# Patient Record
Sex: Male | Born: 1945 | Race: White | Hispanic: No | Smoking: Never smoker
Health system: Southern US, Community
[De-identification: ages and names within clinical notes are randomized; demographics above are authoritative.]

## PROBLEM LIST (undated history)

## (undated) DIAGNOSIS — F419 Anxiety disorder, unspecified: Secondary | ICD-10-CM

## (undated) DIAGNOSIS — M1990 Unspecified osteoarthritis, unspecified site: Secondary | ICD-10-CM

## (undated) DIAGNOSIS — E119 Type 2 diabetes mellitus without complications: Secondary | ICD-10-CM

## (undated) DIAGNOSIS — H269 Unspecified cataract: Secondary | ICD-10-CM

## (undated) DIAGNOSIS — Z85828 Personal history of other malignant neoplasm of skin: Secondary | ICD-10-CM

## (undated) DIAGNOSIS — Z8719 Personal history of other diseases of the digestive system: Secondary | ICD-10-CM

## (undated) DIAGNOSIS — R6889 Other general symptoms and signs: Secondary | ICD-10-CM

## (undated) DIAGNOSIS — Z973 Presence of spectacles and contact lenses: Secondary | ICD-10-CM

## (undated) DIAGNOSIS — L570 Actinic keratosis: Secondary | ICD-10-CM

## (undated) DIAGNOSIS — N189 Chronic kidney disease, unspecified: Secondary | ICD-10-CM

## (undated) DIAGNOSIS — C4491 Basal cell carcinoma of skin, unspecified: Secondary | ICD-10-CM

## (undated) DIAGNOSIS — C61 Malignant neoplasm of prostate: Secondary | ICD-10-CM

## (undated) DIAGNOSIS — R972 Elevated prostate specific antigen [PSA]: Secondary | ICD-10-CM

## (undated) DIAGNOSIS — T884XXA Failed or difficult intubation, initial encounter: Secondary | ICD-10-CM

## (undated) DIAGNOSIS — K219 Gastro-esophageal reflux disease without esophagitis: Secondary | ICD-10-CM

## (undated) DIAGNOSIS — Z9889 Other specified postprocedural states: Secondary | ICD-10-CM

## (undated) HISTORY — PX: JOINT REPLACEMENT: SHX530

## (undated) HISTORY — DX: Actinic keratosis: L57.0

## (undated) HISTORY — DX: Type 2 diabetes mellitus without complications: E11.9

## (undated) HISTORY — DX: Malignant neoplasm of prostate: C61

## (undated) HISTORY — PX: TONSILLECTOMY: SUR1361

## (undated) HISTORY — DX: Unspecified osteoarthritis, unspecified site: M19.90

## (undated) HISTORY — DX: Anxiety disorder, unspecified: F41.9

## (undated) HISTORY — PX: MOHS SURGERY: SUR867

## (undated) HISTORY — DX: Basal cell carcinoma of skin, unspecified: C44.91

## (undated) HISTORY — DX: Gastro-esophageal reflux disease without esophagitis: K21.9

---

## 1999-01-19 ENCOUNTER — Encounter: Payer: Self-pay | Admitting: Family Medicine

## 1999-01-19 HISTORY — PX: TOTAL HIP ARTHROPLASTY: SHX124

## 1999-02-02 ENCOUNTER — Encounter: Payer: Self-pay | Admitting: Orthopedic Surgery

## 1999-02-09 ENCOUNTER — Inpatient Hospital Stay (HOSPITAL_COMMUNITY): Admission: RE | Admit: 1999-02-09 | Discharge: 1999-02-13 | Payer: Self-pay | Admitting: Orthopedic Surgery

## 1999-02-09 ENCOUNTER — Encounter: Payer: Self-pay | Admitting: Orthopedic Surgery

## 1999-02-11 ENCOUNTER — Encounter: Payer: Self-pay | Admitting: Orthopedic Surgery

## 2001-03-20 ENCOUNTER — Encounter: Payer: Self-pay | Admitting: Family Medicine

## 2001-06-20 ENCOUNTER — Encounter: Payer: Self-pay | Admitting: Family Medicine

## 2001-12-18 ENCOUNTER — Encounter: Payer: Self-pay | Admitting: Family Medicine

## 2002-03-20 ENCOUNTER — Encounter: Payer: Self-pay | Admitting: Family Medicine

## 2002-03-20 LAB — CONVERTED CEMR LAB
Hgb A1c MFr Bld: 6.9 %
PSA: 0.5 ng/mL

## 2002-09-19 ENCOUNTER — Encounter: Payer: Self-pay | Admitting: Family Medicine

## 2002-09-19 LAB — CONVERTED CEMR LAB: Hgb A1c MFr Bld: 7.5 %

## 2003-01-19 ENCOUNTER — Encounter: Payer: Self-pay | Admitting: Family Medicine

## 2003-01-19 LAB — CONVERTED CEMR LAB: Hgb A1c MFr Bld: 6.4 %

## 2003-06-21 ENCOUNTER — Encounter: Payer: Self-pay | Admitting: Family Medicine

## 2003-06-21 LAB — CONVERTED CEMR LAB: PSA: 0.7 ng/mL

## 2003-09-19 ENCOUNTER — Encounter: Payer: Self-pay | Admitting: Family Medicine

## 2003-09-19 LAB — CONVERTED CEMR LAB: Hgb A1c MFr Bld: 5.9 %

## 2004-04-20 ENCOUNTER — Encounter: Payer: Self-pay | Admitting: Family Medicine

## 2004-04-20 LAB — CONVERTED CEMR LAB: Hgb A1c MFr Bld: 7.5 %

## 2004-05-07 ENCOUNTER — Ambulatory Visit: Payer: Self-pay | Admitting: Family Medicine

## 2004-05-11 ENCOUNTER — Ambulatory Visit: Payer: Self-pay | Admitting: Family Medicine

## 2004-07-20 ENCOUNTER — Ambulatory Visit: Payer: Self-pay | Admitting: Family Medicine

## 2004-07-21 ENCOUNTER — Encounter: Payer: Self-pay | Admitting: Family Medicine

## 2004-07-21 LAB — CONVERTED CEMR LAB: Hgb A1c MFr Bld: 6.4 %

## 2004-08-10 ENCOUNTER — Ambulatory Visit: Payer: Self-pay | Admitting: Family Medicine

## 2004-08-12 ENCOUNTER — Ambulatory Visit: Payer: Self-pay | Admitting: Family Medicine

## 2005-06-20 ENCOUNTER — Encounter: Payer: Self-pay | Admitting: Family Medicine

## 2005-06-20 LAB — CONVERTED CEMR LAB
Hgb A1c MFr Bld: 7.9 %
PSA: 1.11 ng/mL

## 2005-07-13 ENCOUNTER — Ambulatory Visit: Payer: Self-pay | Admitting: Family Medicine

## 2005-07-20 ENCOUNTER — Ambulatory Visit: Payer: Self-pay | Admitting: Family Medicine

## 2005-07-29 HISTORY — PX: ESOPHAGOGASTRODUODENOSCOPY: SHX1529

## 2005-08-10 ENCOUNTER — Ambulatory Visit: Payer: Self-pay | Admitting: Internal Medicine

## 2005-08-31 ENCOUNTER — Ambulatory Visit: Payer: Self-pay | Admitting: Family Medicine

## 2005-10-18 ENCOUNTER — Ambulatory Visit: Payer: Self-pay | Admitting: Family Medicine

## 2005-10-18 LAB — CONVERTED CEMR LAB: Hgb A1c MFr Bld: 6.4 %

## 2005-10-20 ENCOUNTER — Ambulatory Visit: Payer: Self-pay | Admitting: Family Medicine

## 2006-07-20 ENCOUNTER — Ambulatory Visit: Payer: Self-pay | Admitting: Family Medicine

## 2006-07-20 LAB — CONVERTED CEMR LAB
ALT: 32 units/L (ref 0–40)
AST: 26 units/L (ref 0–37)
Albumin: 3.9 g/dL (ref 3.5–5.2)
Alkaline Phosphatase: 64 units/L (ref 39–117)
BUN: 11 mg/dL (ref 6–23)
Bilirubin, Direct: 0.1 mg/dL (ref 0.0–0.3)
CO2: 29 meq/L (ref 19–32)
Calcium: 9.3 mg/dL (ref 8.4–10.5)
Chloride: 103 meq/L (ref 96–112)
Cholesterol: 142 mg/dL (ref 0–200)
Creatinine, Ser: 1 mg/dL (ref 0.4–1.5)
Creatinine,U: 146.1 mg/dL
GFR calc Af Amer: 98 mL/min
GFR calc non Af Amer: 81 mL/min
Glucose, Bld: 135 mg/dL — ABNORMAL HIGH (ref 70–99)
HDL: 47.5 mg/dL (ref >39.0)
Hgb A1c MFr Bld: 7.3 %
Hgb A1c MFr Bld: 7.3 % — ABNORMAL HIGH (ref 4.6–6.0)
LDL Cholesterol: 83 mg/dL (ref 0–99)
Microalb Creat Ratio: 1.4 mg/g (ref 0.0–30.0)
Microalb, Ur: 0.2 mg/dL (ref 0.0–1.9)
Microalbumin U total vol: 1.4 mg/L
PSA: 1.05 ng/mL
PSA: 1.05 ng/mL (ref 0.10–4.00)
Potassium: 4 meq/L (ref 3.5–5.1)
Sodium: 140 meq/L (ref 135–145)
TSH: 2.28 microintl units/mL (ref 0.35–5.50)
Total Bilirubin: 0.7 mg/dL (ref 0.3–1.2)
Total CHOL/HDL Ratio: 3
Total Protein: 6 g/dL (ref 6.0–8.3)
Triglycerides: 57 mg/dL (ref 0–149)
VLDL: 11 mg/dL (ref 0–40)

## 2006-07-24 ENCOUNTER — Ambulatory Visit: Payer: Self-pay | Admitting: Family Medicine

## 2006-09-13 ENCOUNTER — Ambulatory Visit: Payer: Self-pay | Admitting: Family Medicine

## 2006-10-19 ENCOUNTER — Ambulatory Visit: Payer: Self-pay | Admitting: Family Medicine

## 2006-10-19 LAB — CONVERTED CEMR LAB: Hgb A1c MFr Bld: 7.2 % — ABNORMAL HIGH (ref 4.6–6.0)

## 2006-10-31 ENCOUNTER — Encounter: Payer: Self-pay | Admitting: Family Medicine

## 2006-10-31 DIAGNOSIS — K7689 Other specified diseases of liver: Secondary | ICD-10-CM | POA: Insufficient documentation

## 2006-11-03 DIAGNOSIS — K219 Gastro-esophageal reflux disease without esophagitis: Secondary | ICD-10-CM | POA: Insufficient documentation

## 2006-11-03 DIAGNOSIS — F411 Generalized anxiety disorder: Secondary | ICD-10-CM | POA: Insufficient documentation

## 2006-11-03 DIAGNOSIS — R7989 Other specified abnormal findings of blood chemistry: Secondary | ICD-10-CM | POA: Insufficient documentation

## 2006-11-06 ENCOUNTER — Ambulatory Visit: Payer: Self-pay | Admitting: Family Medicine

## 2007-02-14 ENCOUNTER — Encounter: Payer: Self-pay | Admitting: Family Medicine

## 2007-03-19 ENCOUNTER — Encounter: Payer: Self-pay | Admitting: Family Medicine

## 2007-05-04 ENCOUNTER — Ambulatory Visit: Payer: Self-pay | Admitting: Family Medicine

## 2007-05-06 LAB — CONVERTED CEMR LAB: Hgb A1c MFr Bld: 7.7 % — ABNORMAL HIGH (ref 4.6–6.0)

## 2007-05-09 ENCOUNTER — Ambulatory Visit: Payer: Self-pay | Admitting: Family Medicine

## 2007-08-23 ENCOUNTER — Telehealth: Payer: Self-pay | Admitting: Family Medicine

## 2007-09-03 ENCOUNTER — Telehealth: Payer: Self-pay | Admitting: Family Medicine

## 2007-09-17 ENCOUNTER — Ambulatory Visit: Payer: Self-pay | Admitting: Family Medicine

## 2007-09-17 LAB — CONVERTED CEMR LAB
AST: 24 units/L (ref 0–37)
Albumin: 3.5 g/dL (ref 3.5–5.2)
BUN: 13 mg/dL (ref 6–23)
Basophils Absolute: 0 10*3/uL (ref 0.0–0.1)
Basophils Relative: 0.7 % (ref 0.0–1.0)
Chloride: 106 meq/L (ref 96–112)
Cholesterol: 126 mg/dL (ref 0–200)
Creatinine, Ser: 1 mg/dL (ref 0.4–1.5)
Creatinine,U: 95.6 mg/dL
Eosinophils Absolute: 0.1 10*3/uL (ref 0.0–0.7)
Eosinophils Relative: 2.7 % (ref 0.0–5.0)
GFR calc Af Amer: 98 mL/min
GFR calc non Af Amer: 81 mL/min
HCT: 43.1 % (ref 39.0–52.0)
HDL: 49.1 mg/dL (ref >39.0)
Hgb A1c MFr Bld: 8.1 % — ABNORMAL HIGH (ref 4.6–6.0)
MCHC: 33.7 g/dL (ref 30.0–36.0)
MCV: 94.3 fL (ref 78.0–100.0)
Microalb Creat Ratio: 2.1 mg/g (ref 0.0–30.0)
Monocytes Absolute: 0.4 10*3/uL (ref 0.1–1.0)
Neutrophils Relative %: 48.6 % (ref 43.0–77.0)
Platelets: 238 10*3/uL (ref 150–400)
Potassium: 4.6 meq/L (ref 3.5–5.1)
Total Bilirubin: 1 mg/dL (ref 0.3–1.2)
Triglycerides: 34 mg/dL (ref 0–149)
Uric Acid, Serum: 6.5 mg/dL (ref 4.0–7.8)
VLDL: 7 mg/dL (ref 0–40)

## 2007-09-26 ENCOUNTER — Ambulatory Visit: Payer: Self-pay | Admitting: Family Medicine

## 2007-12-26 ENCOUNTER — Ambulatory Visit: Payer: Self-pay | Admitting: Family Medicine

## 2007-12-31 ENCOUNTER — Ambulatory Visit: Payer: Self-pay | Admitting: Family Medicine

## 2008-01-29 ENCOUNTER — Telehealth (INDEPENDENT_AMBULATORY_CARE_PROVIDER_SITE_OTHER): Payer: Self-pay | Admitting: Internal Medicine

## 2008-02-01 ENCOUNTER — Telehealth: Payer: Self-pay | Admitting: Family Medicine

## 2008-03-18 ENCOUNTER — Encounter: Payer: Self-pay | Admitting: Family Medicine

## 2008-04-24 ENCOUNTER — Telehealth: Payer: Self-pay | Admitting: Family Medicine

## 2008-09-08 ENCOUNTER — Telehealth: Payer: Self-pay | Admitting: Family Medicine

## 2008-11-06 ENCOUNTER — Telehealth: Payer: Self-pay | Admitting: Family Medicine

## 2008-11-07 ENCOUNTER — Telehealth: Payer: Self-pay | Admitting: Family Medicine

## 2008-11-13 ENCOUNTER — Encounter: Payer: Self-pay | Admitting: Family Medicine

## 2008-11-13 ENCOUNTER — Telehealth: Payer: Self-pay | Admitting: Family Medicine

## 2009-01-16 ENCOUNTER — Ambulatory Visit: Payer: Self-pay | Admitting: Family Medicine

## 2009-01-16 LAB — CONVERTED CEMR LAB
ALT: 38 units/L (ref 0–53)
Creatinine, Ser: 1.1 mg/dL (ref 0.4–1.5)
HDL: 55.6 mg/dL (ref >39.00)
Hgb A1c MFr Bld: 9.4 % — ABNORMAL HIGH (ref 4.6–6.5)
LDL Cholesterol: 80 mg/dL (ref 0–99)
Potassium: 4.5 meq/L (ref 3.5–5.1)
Sodium: 140 meq/L (ref 135–145)
TSH: 1.4 microintl units/mL (ref 0.35–5.50)
Total CHOL/HDL Ratio: 3
Total Protein: 6.8 g/dL (ref 6.0–8.3)
VLDL: 11.4 mg/dL (ref 0.0–40.0)

## 2009-01-22 ENCOUNTER — Ambulatory Visit: Payer: Self-pay | Admitting: Family Medicine

## 2009-03-19 ENCOUNTER — Encounter: Payer: Self-pay | Admitting: Family Medicine

## 2009-04-22 ENCOUNTER — Ambulatory Visit: Payer: Self-pay | Admitting: Family Medicine

## 2009-04-27 ENCOUNTER — Ambulatory Visit: Payer: Self-pay | Admitting: Family Medicine

## 2009-08-19 ENCOUNTER — Ambulatory Visit: Payer: Self-pay | Admitting: Family Medicine

## 2009-08-19 LAB — CONVERTED CEMR LAB: Hgb A1c MFr Bld: 6 % (ref 4.6–6.5)

## 2009-08-26 ENCOUNTER — Ambulatory Visit: Payer: Self-pay | Admitting: Family Medicine

## 2010-01-20 ENCOUNTER — Telehealth: Payer: Self-pay | Admitting: Family Medicine

## 2010-01-26 ENCOUNTER — Encounter (INDEPENDENT_AMBULATORY_CARE_PROVIDER_SITE_OTHER): Payer: Self-pay | Admitting: *Deleted

## 2010-01-26 ENCOUNTER — Encounter: Payer: Self-pay | Admitting: Family Medicine

## 2010-02-19 ENCOUNTER — Telehealth: Payer: Self-pay | Admitting: Family Medicine

## 2010-03-31 ENCOUNTER — Encounter: Payer: Self-pay | Admitting: Family Medicine

## 2010-05-26 LAB — HM DIABETES EYE EXAM: HM Diabetic Eye Exam: NORMAL

## 2010-06-09 ENCOUNTER — Ambulatory Visit: Payer: Self-pay | Admitting: Family Medicine

## 2010-06-09 LAB — CONVERTED CEMR LAB
ALT: 18 units/L (ref 0–53)
AST: 24 units/L (ref 0–37)
Albumin: 4.1 g/dL (ref 3.5–5.2)
Calcium: 9.2 mg/dL (ref 8.4–10.5)
GFR calc non Af Amer: 75.54 mL/min (ref >60.00)
HDL: 54.8 mg/dL (ref >39.00)
Hgb A1c MFr Bld: 6.7 % — ABNORMAL HIGH (ref 4.6–6.5)
PSA: 2.58 ng/mL (ref 0.10–4.00)
Potassium: 4.2 meq/L (ref 3.5–5.1)
Sodium: 142 meq/L (ref 135–145)
TSH: 1.63 microintl units/mL (ref 0.35–5.50)
Total Protein: 6.5 g/dL (ref 6.0–8.3)
Triglycerides: 46 mg/dL (ref 0.0–149.0)
Uric Acid, Serum: 5.9 mg/dL (ref 4.0–7.8)
VLDL: 9.2 mg/dL (ref 0.0–40.0)

## 2010-06-16 ENCOUNTER — Ambulatory Visit: Payer: Self-pay | Admitting: Family Medicine

## 2010-06-16 LAB — HM DIABETES FOOT EXAM

## 2010-07-20 NOTE — Progress Notes (Signed)
Summary: needs celebrex sent to [facility]  Phone Note Refill Request Message from:  Patient  Refills Requested: Medication #1:  CELEBREX 200 MG CAPS Take one by mouth daily Please send script to [facility]  Initial call taken by: Lowella Petties CMA,  February 19, 2010 11:43 AM  Follow-up for Phone Call        faxed.  Follow-up by: Crawford Givens MD,  February 19, 2010 1:36 PM    Prescriptions: CELEBREX 200 MG CAPS (CELECOXIB) Take one by mouth daily  #90 x 3   Entered and Authorized by:   Crawford Givens MD   Signed by:   Crawford Givens MD on 02/19/2010   Method used:   Faxed to ...       [facility] (mail-order)             , [place]         Ph: [phone]       Fax: [phone]   RxID:   [id number]

## 2010-07-20 NOTE — Medication Information (Signed)
Summary: Prior Authorization & Approval for Celebrex/[employer]  Prior Authorization & Approval for Celebrex/[employer]   Imported By: Lanelle Bal 02/01/2010 11:00:44  _____________________________________________________________________  External Attachment:    Type:   Image     Comment:   External Document

## 2010-07-20 NOTE — Assessment & Plan Note (Signed)
Summary: 4 MONTH FOLLOW UP/[facility]   Vital Signs:  Patient profile:   65 year old male Weight:      160.25 pounds Temp:     98.3 degrees F oral Pulse rate:   76 / minute Pulse rhythm:   regular BP sitting:   120 / 82  (left arm) Cuff size:   regular  Vitals Entered By: Sydell Axon LPN (August 26, 8117 8:10 AM) CC: 4 Month follow-up after labs   History of Present Illness: Guy [name] is a 65 y/o caucasian male who presents today for a 4 mo f/u on A1C and diabetes management.  His A1C has improved from 7.2 4 months ago to 6.0 today.  He has made significant lifestyle changes including joining [employer] with his wife and eating a diet full of salads, fruits and vegetables.  His weight has dropped from 179 lbs 4 months ago to 160 lbs today.  He walks regularly and helps his daughter with yard work as she had a bilateral amputation about 1 year ago.  He is very happy with these results and thinks that he will be able to maintain the lifestyle changes and stated that he finally realized that "you can't take short cuts" to lose weight and maintain weight loss.  From time to time, he checks his morning glucose which normally runs in the mid to low 90s.  Problems Prior to Update: 1)  Plantar Fasciitis, Bilateral  (ICD-728.71) 2)  Hip Pain, Left  (ICD-719.45) 3)  Health Maintenance Exam  (ICD-V70.0) 4)  Special Screening Malignant Neoplasm of Prostate  (ICD-V76.44) 5)  Hyperuricemia  (ICD-790.6) 6)  Fatty Liver Disease  (ICD-571.8) 7)  Hyperglycemia  (ICD-790.6) 8)  Hypercholesterolemia, Ldl 119  (ICD-272.0) 9)  Carcinoma, Basal Cell (RIGHT TEMPLE AREA)  (ICD-173.9) 10)  Gerd  (ICD-530.81) 11)  Depression/ Insomnia  (ICD-311) 12)  Anxiety  (ICD-300.00) 13)  Diabetes Mellitus, Type II  (ICD-250.00)  Medications Prior to Update: 1)  Metformin Hcl 1000 Mg Tabs (Metformin Hcl) .... One Tab By Mouth Two Times A Day 2)  Prilosec 20 Mg Cpdr (Omeprazole) .... Take 1 Capsule By Mouth Twice A  Day 3)  Aspirin 81 Mg Tabs (Aspirin) .... Take One By Mouth Daily 4)  Altace 5 Mg Caps (Ramipril) .... Take One By Mouth Daily 5)  Actos 45 Mg Tabs (Pioglitazone Hcl) .... Take One By Mouth Daily 6)  Celebrex 200 Mg Caps (Celecoxib) .... Take One By Mouth Daily 7)  Pravastatin Sodium 40 Mg Tabs (Pravastatin Sodium) .... Take One By Mouth At Bedtime 8)  Viagra 50 Mg Tabs (Sildenafil Citrate) .... Take One By Mouth As Directed 9)  Ramipril 5 Mg Caps (Ramipril) .... One Tab By Mouth Daily  Allergies: No Known Drug Allergies  Physical Exam  General:  Well-developed,well-nourished,in no acute distress; alert,appropriate and cooperative throughout examination Head:  Normocephalic and atraumatic without obvious abnormalities. No apparent alopecia or balding. Eyes:  Conjunctiva clear bilaterally.  Ears:  External ear exam shows no significant lesions or deformities.  Otoscopic examination reveals clear canals, tympanic membranes are intact bilaterally without bulging, retraction, inflammation or discharge. Hearing is grossly normal bilaterally. Nose:  External nasal examination shows no deformity or inflammation. Nasal mucosa are pink and moist without lesions or exudates. Mouth:  Oral mucosa and oropharynx without lesions or exudates.  Teeth in good repair. Lungs:  Normal respiratory effort, chest expands symmetrically. Lungs are clear to auscultation, no crackles or wheezes. Heart:  Normal rate and  regular rhythm. S1 and S2 normal without gallop, murmur, click, rub or other extra sounds.   Impression & Recommendations:  Problem # 1:  DIABETES MELLITUS, TYPE II (ICD-250.00) Assessment Improved  Great job. Challenge now is to continue. His updated medication list for this problem includes:    Metformin Hcl 1000 Mg Tabs (Metformin hcl) ..... One tab by mouth two times a day    Aspirin 81 Mg Tabs (Aspirin) .Marland Kitchen... Take one by mouth daily    Altace 5 Mg Caps (Ramipril) .Marland Kitchen... Take one by mouth  daily    Actos 45 Mg Tabs (Pioglitazone hcl) .Marland Kitchen... Take one by mouth daily    Ramipril 5 Mg Caps (Ramipril) ..... One tab by mouth daily  Labs Reviewed: Creat: 1.1 (01/16/2009)   Microalbumin: 1.4 (07/20/2006)  Last Eye Exam: normal (03/09/2009) Reviewed HgBA1c results: 6.0 (08/19/2009)  7.2 (04/22/2009)  Complete Medication List: 1)  Metformin Hcl 1000 Mg Tabs (Metformin hcl) .... One tab by mouth two times a day 2)  Prilosec 20 Mg Cpdr (Omeprazole) .... Take 1 capsule by mouth twice a day 3)  Aspirin 81 Mg Tabs (Aspirin) .... Take one by mouth daily 4)  Altace 5 Mg Caps (Ramipril) .... Take one by mouth daily 5)  Actos 45 Mg Tabs (Pioglitazone hcl) .... Take one by mouth daily 6)  Celebrex 200 Mg Caps (Celecoxib) .... Take one by mouth daily 7)  Pravastatin Sodium 40 Mg Tabs (Pravastatin sodium) .... Take one by mouth at bedtime 8)  Viagra 50 Mg Tabs (Sildenafil citrate) .... Take one by mouth as directed 9)  Ramipril 5 Mg Caps (Ramipril) .... One tab by mouth daily  Patient Instructions: 1)  RTC in the Fall for recheck. Prescriptions: ACTOS 45 MG TABS (PIOGLITAZONE HCL) Take one by mouth daily  #90 x 3   Entered by:   Sydell Axon LPN   Authorized by:   Guy Leeks MD   Signed by:   Sydell Axon LPN on 16/03/9603   Method used:   Print then Give to Patient   RxID:   [id number]   Current Allergies (reviewed today): No known allergies

## 2010-07-20 NOTE — Consult Note (Signed)
Summary: Dr.Charles Sydnor,[facility],Note  Dr.Charles Sydnor,[facility],Note   Imported By: Beau Fanny 04/06/2010 08:14:37  _____________________________________________________________________  External Attachment:    Type:   Image     Comment:   External Document  Appended Document: Dr.Charles Sydnor,[facility],Note     Clinical Lists Changes  Observations: Added new observation of DMEYEEXAMNXT: 03/2011 (04/06/2010 20:27) Added new observation of DMEYEEXMRES: normal (03/31/2010 20:28) Added new observation of EYE EXAM BY: Dr Oren Bracket, [facility] (03/31/2010 20:28) Added new observation of DIAB EYE EX: normal (03/31/2010 20:28)        Diabetes Management Exam:    Eye Exam:       Eye Exam done elsewhere          Date: 03/31/2010          Results: normal          Done by: Dr Oren Bracket, [facility]

## 2010-07-20 NOTE — Progress Notes (Signed)
Summary: prior auth needed for celebrex  Phone Note Other Incoming   Caller: [employer] Summary of Call: Prior Berkley Harvey is needed for celebrex, form is on your desk.  We didnt call for this form, not sure why we have it,unless pt has current auth that is going to expire. Initial call taken by: Lowella Petties CMA,  January 20, 2010 4:34 PM  Follow-up for Phone Call        call patient.  get info for last set of questions.  He has h/o GERD, so if he has tried 2 other NSAIDS that would appear to be enough.  Follow-up by: Crawford Givens MD,  January 20, 2010 8:35 PM  Additional Follow-up for Phone Call Additional follow up Details #1::        Pt has been on daypro, vioxx and niaspan in the past.           Lowella Petties CMA  January 21, 2010 10:44 AM  Added to form and faxed.  Additional Follow-up by: Delilah Shan CMA Duncan Dull),  January 22, 2010 10:11 AM    Additional Follow-up for Phone Call Additional follow up Details #2::    And did he ever take any ibuprofen or aleve for this?  let me know one way or the other and I'll finish the form.  thanks. Follow-up by: Crawford Givens MD,  January 21, 2010 2:07 PM  Additional Follow-up for Phone Call Additional follow up Details #3:: Details for Additional Follow-up Action Taken: Pt has tried these meds.          Lowella Petties CMA  January 21, 2010 4:38 PM   --please fill in the form to show that patient failed on ibuprofen, aleve, vioxx, and has active GERD.  that should cover it.  please send it in. Crawford Givens MD,  January 21, 2010 11:21 PM   Appended Document: prior auth needed for celebrex Prior auth approval received for celebrex.  Forms sent to doctor for signature and scanning.

## 2010-07-20 NOTE — Letter (Signed)
Summary: Nadara Eaton letter  [facility]  [address] [place], [place] [address]   Phone: [phone]  Fax: [phone]       01/26/2010 MRN: [id number]  [facility] [address] [place], [place]  [address]  Dear Mr. [facility] [place] and [place] announce the retirement of Arta Silence, M.D., from full-time practice at the [facility] office effective December 17, 2009 and his plans of returning part-time.  It is important to Dr. Hetty Ely and to our practice that you understand that [facility] has seven physicians in our office for your health care needs.  We will continue to offer the same exceptional care that you have today.    Dr. Hetty Ely has spoken to many of you about his plans for retirement and returning part-time in the fall.   We will continue to work with you through the transition to schedule appointments for you in the office and meet the high standards that [place] is committed to.   Again, it is with great pleasure that we share the news that Dr. Hetty Ely will return to [facility] at [facility] in October of 2011 with a reduced schedule.    If you have any questions, or would like to request an appointment with one of our physicians, please call us at [phone] and press the option for Scheduling an appointment.  We take pleasure in providing you with excellent patient care and look forward to seeing you at your next office visit.  Our [facility] Physicians are:  Tillman Abide, M.D. Laurita Quint, M.D. Roxy Manns, M.D. Kerby Nora, M.D. Hannah Beat, M.D. Ruthe Mannan, M.D. We proudly welcomed Raechel Ache, M.D. and Eustaquio Boyden, M.D. to the practice in July/August 2011.  Sincerely,  [facility]

## 2010-07-22 NOTE — Assessment & Plan Note (Signed)
Summary: CPX/JRR   Vital Signs:  Patient profile:   65 year old male Height:      65.50 inches Weight:      174.75 pounds BMI:     28.74 Temp:     97.8 degrees F oral Pulse rate:   80 / minute Pulse rhythm:   regular BP sitting:   110 / 70  (left arm) Cuff size:   regular  Vitals Entered By: Delilah Shan CMA Duncan Dull) (June 16, 2010 8:58 AM) CC: CPX   History of Present Illness: Pt here for Comp Exam. He is feeling well and has no complaints. He has gained nine pounds since he quit walking. His brother needs a heart valve. N known reason for the valve problem. He has had a pacemaker. Wife recently had a knee replacement.   Preventive Screening-Counseling & Management  Alcohol-Tobacco     Alcohol drinks/day: <1     Alcohol type: beer     Smoking Status: never     Passive Smoke Exposure: no  Caffeine-Diet-Exercise     Caffeine use/day: 1     Does Patient Exercise: yes     Type of exercise: walking     Exercise (avg: min/session): >60     Times/week: 7  Problems Prior to Update: 1)  Plantar Fasciitis, Bilateral  (ICD-728.71) 2)  Hip Pain, Left  (ICD-719.45) 3)  Health Maintenance Exam  (ICD-V70.0) 4)  Special Screening Malignant Neoplasm of Prostate  (ICD-V76.44) 5)  Hyperuricemia  (ICD-790.6) 6)  Fatty Liver Disease  (ICD-571.8) 7)  Hypercholesterolemia, Ldl 119  (ICD-272.0) 8)  Carcinoma, Basal Cell (RIGHT TEMPLE AREA)  (ICD-173.9) 9)  Gerd  (ICD-530.81) 10)  Depression/ Insomnia  (ICD-311) 11)  Anxiety  (ICD-300.00) 12)  Diabetes Mellitus, Type II (HYPERGLY 8/86)  (ICD-250.00)  Medications Prior to Update: 1)  Metformin Hcl 1000 Mg Tabs (Metformin Hcl) .... One Tab By Mouth Two Times A Day 2)  Prilosec 20 Mg Cpdr (Omeprazole) .... Take 1 Capsule By Mouth Twice A Day 3)  Aspirin 81 Mg Tabs (Aspirin) .... Take One By Mouth Daily 4)  Altace 5 Mg Caps (Ramipril) .... Take One By Mouth Daily 5)  Actos 45 Mg Tabs (Pioglitazone Hcl) .... Take One By Mouth  Daily 6)  Celebrex 200 Mg Caps (Celecoxib) .... Take One By Mouth Daily 7)  Pravastatin Sodium 40 Mg Tabs (Pravastatin Sodium) .... Take One By Mouth At Bedtime 8)  Viagra 50 Mg Tabs (Sildenafil Citrate) .... Take One By Mouth As Directed 9)  Ramipril 5 Mg Caps (Ramipril) .... One Tab By Mouth Daily  Current Medications (verified): 1)  Metformin Hcl 1000 Mg Tabs (Metformin Hcl) .... One Tab By Mouth Two Times A Day 2)  Prilosec 20 Mg Cpdr (Omeprazole) .... Take 1 Capsule By Mouth Twice A Day 3)  Aspirin 81 Mg Tabs (Aspirin) .... Take One By Mouth Daily 4)  Actos 45 Mg Tabs (Pioglitazone Hcl) .... Take One By Mouth Daily 5)  Celebrex 200 Mg Caps (Celecoxib) .... Take One By Mouth Daily 6)  Pravastatin Sodium 40 Mg Tabs (Pravastatin Sodium) .... Take One By Mouth At Bedtime 7)  Viagra 50 Mg Tabs (Sildenafil Citrate) .... Take One By Mouth As Directed 8)  Ramipril 5 Mg Caps (Ramipril) .... One Tab By Mouth Daily 9)  Fish Oil   Oil (Fish Oil) .Marland Kitchen.. 1200 Mg. Every Morning 10)  Vitamin C 500 Mg  Tabs (Ascorbic Acid) .... 1,000 Mg. Once Daily 11)  Vitamin B Complex-C  Caps (B Complex-C) .... Once Daily 12)  Multivitamins   Tabs (Multiple Vitamin) .... Take 1 Tablet By Mouth Once A Day 13)  Tylenol Pm Extra Strength 500-25 Mg Tabs (Diphenhydramine-Apap (Sleep)) .... As Needed  Allergies: No Known Drug Allergies  Past History:  Past Medical History: Last updated: 10/31/2006 Anxiety Depression GERD Diabetes mellitus, type II  Past Surgical History: Last updated: 10/31/2006 Tonsillectomy as child Hip Replacement R (DR Geoffre) 01/1999 Abd. U/S, wnl 07/04/05 EGD, wnl H. H. 07/29/05 Colonoscopy wnl except hemorrhoids 07/29/05 Abd. U/S gallbladder polyp 07/08/05  Family History: Last updated: 06-22-2010 Father: Died at ge 24, Alzheimer's, many strokes, polyps, HTN,and diabetes Mother: A 54 Smoker COPD Shingles Brother A 64  Obesity  HTN and poss. 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 Daughter with septic shock x 2, now with bilat foot amputations BTK.  Risk Factors: Alcohol Use: <1 (Jun 22, 2010) Caffeine Use: 1 (06-22-2010) Exercise: yes (June 22, 2010)  Risk Factors: Smoking Status: never (June 22, 2010) Passive Smoke Exposure: no (06-22-2010)  Family History: Father: Died at ge 18, Alzheimer's, many strokes, polyps, HTN,and diabetes Mother: A 70 Smoker COPD Shingles Brother A 64  Obesity  HTN and poss. 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 Daughter with septic shock x 2, now with bilat foot amputations BTK.  Review of Systems General:  Denies chills, fatigue, fever, sweats, weakness, and weight loss. Eyes:  Denies blurring, discharge, and eye pain. ENT:  Denies decreased hearing, earache, and ringing in ears. CV:  Denies chest pain or discomfort, fainting, fatigue, palpitations, shortness of breath with exertion, swelling of feet, and swelling of hands. Resp:  Denies cough, shortness of breath, and wheezing. GI:  Complains of indigestion; denies abdominal pain, bloody stools, change in bowel habits, constipation, dark tarry stools, diarrhea, loss of appetite, nausea, vomiting, vomiting blood, and yellowish skin color; occas. GU:  Denies discharge, dysuria, nocturia, and urinary frequency. MS:  Denies joint pain, low back pain, muscle aches, muscle weakness, and stiffness. Derm:  Denies dryness, itching, and rash. Neuro:  Denies numbness, poor balance, tingling, and tremors.  Physical Exam  General:  Well-developed,well-nourished,in no acute distress; alert,appropriate and cooperative throughout examination Head:  Normocephalic and atraumatic without obvious abnormalities. No  apparent alopecia or balding. Eyes:  Conjunctiva clear bilaterally.  Ears:  External ear exam shows no significant lesions or deformities.  Otoscopic examination reveals clear canals, tympanic membranes are intact bilaterally without bulging, retraction, inflammation or discharge. Hearing is grossly normal bilaterally. Nose:  External nasal examination shows no deformity or inflammation. Nasal mucosa are pink and moist without lesions or exudates. Mouth:  Oral mucosa and oropharynx without lesions or exudates.  Teeth in good repair. Neck:  No deformities, masses, or tenderness noted. Chest Wall:  No deformities, masses, tenderness or gynecomastia noted. Breasts:  No masses or gynecomastia noted Lungs:  Normal respiratory effort, chest expands symmetrically. Lungs are clear to auscultation, no crackles or wheezes. Heart:  Normal rate and regular rhythm. S1 and S2 normal without gallop, murmur, click, rub or other extra sounds. Abdomen:  Bowel sounds positive,abdomen soft and non-tender without masses, organomegaly or hernias noted. Rectal:  No external abnormalities noted. Normal sphincter tone. No rectal masses or tenderness. G neg. Genitalia:  Testes bilaterally descended without nodularity, tenderness or masses. No  scrotal masses or lesions. No penis lesions or urethral discharge. Prostate:  Prostate gland firm and smooth, no enlargement, nodularity, tenderness, mass, asymmetry or induration. 30gms Msk:  No deformity or scoliosis noted of thoracic or lumbar spine.   Pulses:  R and L carotid,radial,femoral,dorsalis pedis and posterior tibial pulses are full and equal bilaterally Extremities:  No clubbing, cyanosis, edema, or deformity noted with normal full range of motion of all joints.  L shoulder  back to normal. Slight triggering of left thumb. Neurologic:  No cranial nerve deficits noted. Station and gait are normal.  Sensory, motor and coordinative functions appear intact. Skin:  Intact  without suspicious lesions or rashes Cervical Nodes:  No lymphadenopathy noted Inguinal Nodes:  No significant adenopathy Psych:  Cognition and judgment appear intact. Alert and cooperative with normal attention span and concentration. No apparent delusions, illusions, hallucinations  Diabetes Management Exam:    Foot Exam (with socks and/or shoes not present):       Sensory-Pinprick/Light touch:          Left medial foot (L-4): normal          Left dorsal foot (L-5): normal          Left lateral foot (S-1): normal          Right medial foot (L-4): normal          Right dorsal foot (L-5): normal          Right lateral foot (S-1): normal       Sensory-Monofilament:          Left foot: normal          Right foot: normal       Inspection:          Left foot: normal          Right foot: normal       Nails:          Left foot: normal          Right foot: normal    Eye Exam:       Eye Exam done elsewhere          Date: 05/26/2010          Results: normal          Done by: Dr Bettey Costa, [facility]   Impression & Recommendations:  Problem # 1:  HEALTH MAINTENANCE EXAM (ICD-V70.0)  Problem # 2:  SPECIAL SCREENING MALIGNANT NEOPLASM OF PROSTATE (ICD-V76.44) Assessment: Unchanged Stable PSA and exam.  Problem # 3:  HYPERURICEMIA (ICD-790.6) Assessment: Improved nml today.  Problem # 4:  FATTY LIVER DISEASE (ICD-571.8) Assessment: Improved Essentially nml LFTS this year.  Problem # 5:  HYPERCHOLESTEROLEMIA, LDL 119 (ICD-272.0) Assessment: Unchanged Good control. Cont medication. His updated medication list for this problem includes:    Pravastatin Sodium 40 Mg Tabs (Pravastatin sodium) .Marland Kitchen... Take one by mouth at bedtime  Labs Reviewed: SGOT: 24 (06/09/2010)   SGPT: 18 (06/09/2010)   HDL:54.80 (06/09/2010), 55.60 (01/16/2009)  LDL:64 (06/09/2010), 80 (01/16/2009)  Chol:128 (06/09/2010), 147 (01/16/2009)  Trig:46.0 (06/09/2010), 57.0 (01/16/2009)  Problem # 6:  GERD  (ICD-530.81) Assessment: Unchanged  Adequate with weight loss....get down to his ideal weight.  His updated medication list for this problem includes:    Prilosec 20 Mg Cpdr (Omeprazole) .Marland Kitchen... Take 1 capsule by mouth twice a day  Problem # 7:  DIABETES MELLITUS, TYPE II (HYPERGLY 8/86) (ICD-250.00) Assessment: Deteriorated Slightly worse ...discussed at length, exercise and resultant weight  loss important. The following medications were removed from the medication list:    Altace 5 Mg Caps (Ramipril) .Marland Kitchen... Take one by mouth daily His updated medication list for this problem includes:    Metformin Hcl 1000 Mg Tabs (Metformin hcl) ..... One tab by mouth two times a day    Aspirin 81 Mg Tabs (Aspirin) .Marland Kitchen... Take one by mouth daily    Actos 45 Mg Tabs (Pioglitazone hcl) .Marland Kitchen... Take one by mouth daily    Ramipril 5 Mg Caps (Ramipril) ..... One tab by mouth daily  Labs Reviewed: Creat: 1.1 (06/09/2010)   Microalbumin: 1.4 (07/20/2006)  Last Eye Exam: normal (05/26/2010) Reviewed HgBA1c results: 6.7 (06/09/2010)  6.0 (08/19/2009)  Complete Medication List: 1)  Metformin Hcl 1000 Mg Tabs (Metformin hcl) .... One tab by mouth two times a day 2)  Prilosec 20 Mg Cpdr (Omeprazole) .... Take 1 capsule by mouth twice a day 3)  Aspirin 81 Mg Tabs (Aspirin) .... Take one by mouth daily 4)  Actos 45 Mg Tabs (Pioglitazone hcl) .... Take one by mouth daily 5)  Celebrex 200 Mg Caps (Celecoxib) .... Take one by mouth daily 6)  Pravastatin Sodium 40 Mg Tabs (Pravastatin sodium) .... Take one by mouth at bedtime 7)  Viagra 50 Mg Tabs (Sildenafil citrate) .... Take one by mouth as directed 8)  Ramipril 5 Mg Caps (Ramipril) .... One tab by mouth daily 9)  Fish Oil Oil (Fish oil) .Marland Kitchen.. 1200 mg. every morning 10)  Vitamin C 500 Mg Tabs (Ascorbic acid) .... 1,000 mg. once daily 11)  Vitamin B Complex-c Caps (B complex-c) .... Once daily 12)  Multivitamins Tabs (Multiple vitamin) .... Take 1 tablet by mouth  once a day 13)  Tylenol Pm Extra Strength 500-25 Mg Tabs (Diphenhydramine-apap (sleep)) .... As needed  Patient Instructions: 1)  RTC 6 mos., A1C prior 250.00 2)  Call if triggering of thumb gets worse. Prescriptions: VIAGRA 50 MG TABS (SILDENAFIL CITRATE) Take one by mouth as directed  #9 x 4   Entered and Authorized by:   Shaune Leeks MD   Signed by:   Shaune Leeks MD on 06/16/2010   Method used:   Print then Give to Patient   RxID:   [id number] RAMIPRIL 5 MG CAPS (RAMIPRIL) one tab by mouth daily  #90 Capsule x 3   Entered and Authorized by:   Shaune Leeks MD   Signed by:   Shaune Leeks MD on 06/16/2010   Method used:   Print then Give to Patient   RxID:   [id number] CELEBREX 200 MG CAPS (CELECOXIB) Take one by mouth daily  #90 x 3   Entered and Authorized by:   Shaune Leeks MD   Signed by:   Shaune Leeks MD on 06/16/2010   Method used:   Print then Give to Patient   RxID:   [id number] ACTOS 45 MG TABS (PIOGLITAZONE HCL) Take one by mouth daily  #90 x 3   Entered and Authorized by:   Shaune Leeks MD   Signed by:   Shaune Leeks MD on 06/16/2010   Method used:   Print then Give to Patient   RxID:   [id number] PRAVASTATIN SODIUM 40 MG TABS (PRAVASTATIN SODIUM) Take one by mouth at bedtime  #30 x 12   Entered and Authorized by:   Shaune Leeks MD   Signed by:   Shaune Leeks MD on 06/16/2010   Method used:  Print then Give to Patient   RxID:   [id number] METFORMIN HCL 1000 MG TABS (METFORMIN HCL) one tab by mouth two times a day  #60 x 12   Entered and Authorized by:   Shaune Leeks MD   Signed by:   Shaune Leeks MD on 06/16/2010   Method used:   Print then Give to Patient   RxID:   [id number]    Orders Added: 1)  Est. Patient 40-64 years [44034]    Current Allergies (reviewed today): No known allergies

## 2010-10-04 ENCOUNTER — Other Ambulatory Visit: Payer: Self-pay | Admitting: *Deleted

## 2010-10-04 MED ORDER — PRAVASTATIN SODIUM 40 MG PO TABS: 40.0000 mg | ORAL_TABLET | Freq: Every evening | ORAL | Status: DC

## 2010-10-12 ENCOUNTER — Other Ambulatory Visit: Payer: Self-pay | Admitting: *Deleted

## 2010-10-12 MED ORDER — RAMIPRIL 5 MG PO CAPS: 5.0000 mg | ORAL_CAPSULE | Freq: Every day | ORAL | Status: DC

## 2010-10-12 MED ORDER — PIOGLITAZONE HCL 45 MG PO TABS: 45.0000 mg | ORAL_TABLET | Freq: Every day | ORAL | Status: DC

## 2010-10-12 NOTE — Telephone Encounter (Signed)
Patient came by the office and requested that a new rx's be sent in to [facility]. Patient states that he was using [facility] and now [facility]. Patient states that he has decided to use [facility] for all of his prescriptions instead of using [facility]. Patient needs new rx's sent to [facility] for Ramipril 5 mg, one daily and Actos 45 mg, one daily.

## 2010-11-18 ENCOUNTER — Encounter: Payer: Self-pay | Admitting: Family Medicine

## 2010-12-20 ENCOUNTER — Other Ambulatory Visit (INDEPENDENT_AMBULATORY_CARE_PROVIDER_SITE_OTHER): Payer: Federal, State, Local not specified - PPO

## 2010-12-20 DIAGNOSIS — E119 Type 2 diabetes mellitus without complications: Secondary | ICD-10-CM

## 2010-12-20 LAB — HEMOGLOBIN A1C: Hgb A1c MFr Bld: 6.8 % — ABNORMAL HIGH (ref 4.6–6.5)

## 2010-12-23 ENCOUNTER — Ambulatory Visit (INDEPENDENT_AMBULATORY_CARE_PROVIDER_SITE_OTHER): Payer: Federal, State, Local not specified - PPO | Admitting: Family Medicine

## 2010-12-23 ENCOUNTER — Encounter: Payer: Self-pay | Admitting: Family Medicine

## 2010-12-23 ENCOUNTER — Ambulatory Visit: Payer: Self-pay | Admitting: Family Medicine

## 2010-12-23 DIAGNOSIS — E119 Type 2 diabetes mellitus without complications: Secondary | ICD-10-CM | POA: Insufficient documentation

## 2010-12-23 DIAGNOSIS — F411 Generalized anxiety disorder: Secondary | ICD-10-CM

## 2010-12-23 DIAGNOSIS — E1122 Type 2 diabetes mellitus with diabetic chronic kidney disease: Secondary | ICD-10-CM | POA: Insufficient documentation

## 2010-12-23 NOTE — Progress Notes (Signed)
  Subjective:    Patient ID: Guy [name], male    DOB: [birth year], 65 y.o.   MRN: [id number]  HPIPt here for followup. DM last seen had slipped slightly. He is feeling well with no compolaints. He now understandds he is really diabetic. His brother had a valve replacement and had medication changes and problems that were finally straightened out. His daughter got sepsis again anfd then his mother gotr sick. He has now started walking every other day which is now down to 3 miles a day. He cuts three yards a week. His wife has had surgery lately and he had to do all the housework. He is trying to watch his diet..he had gained weight with all the hospital exposure.  He has no complaints today. He walks regularly and has learned to hydrate.  He has been called about his colonoscopy and will schedule next week.    Review of Systems  Constitutional: Negative for fever, chills, diaphoresis, activity change, appetite change and fatigue.  HENT: Negative for hearing loss, ear pain, congestion, sore throat, rhinorrhea, neck pain, neck stiffness, postnasal drip, sinus pressure, tinnitus and ear discharge.   Eyes: Negative for pain, discharge and visual disturbance.  Respiratory: Negative for cough, shortness of breath and wheezing.   Cardiovascular: Negative for chest pain and palpitations.       No SOB w/ exertion  Gastrointestinal:       No heartburn or swallowing problems.  Genitourinary:       No nocturia  Skin:       No itching or dryness.  Neurological:       No tingling or balance problems.  All other systems reviewed and are negative.       Objective:   Physical Exam  Constitutional: He appears well-developed and well-nourished. No distress.  HENT:  Head: Normocephalic and atraumatic.  Right Ear: External ear normal.  Nose: Nose normal.  Mouth/Throat: Oropharynx is clear and moist.       L ear occluded with cerumen.  Eyes: Conjunctivae and EOM are normal. Pupils are equal,  round, and reactive to light. Right eye exhibits no discharge. Left eye exhibits no discharge.  Neck: Normal range of motion. Neck supple.  Cardiovascular: Normal rate and regular rhythm.   Pulmonary/Chest: Effort normal and breath sounds normal. He has no wheezes.  Lymphadenopathy:    He has no cervical adenopathy.  Skin: He is not diaphoretic.          Assessment & Plan:

## 2010-12-23 NOTE — Assessment & Plan Note (Addendum)
A1C minimally improved but now motivated to watch diet and cont exercise. Lab Results  Component Value Date   HGBA1C 6.8* 12/20/2010

## 2010-12-23 NOTE — Assessment & Plan Note (Signed)
Doing well. Handled many stressors with good results.

## 2010-12-23 NOTE — Patient Instructions (Signed)
RTC 6 mos for Comp Exam, labs prior. Cont to be careful with diet and continue exercise.

## 2010-12-30 ENCOUNTER — Other Ambulatory Visit: Payer: Self-pay | Admitting: *Deleted

## 2010-12-30 MED ORDER — PRAVASTATIN SODIUM 40 MG PO TABS: 40.0000 mg | ORAL_TABLET | Freq: Every evening | ORAL | Status: DC

## 2011-01-27 ENCOUNTER — Other Ambulatory Visit: Payer: Self-pay | Admitting: *Deleted

## 2011-01-27 MED ORDER — METFORMIN HCL 1000 MG PO TABS: 1000.0000 mg | ORAL_TABLET | Freq: Two times a day (BID) | ORAL | Status: DC

## 2011-01-27 MED ORDER — RAMIPRIL 5 MG PO CAPS: 5.0000 mg | ORAL_CAPSULE | Freq: Every day | ORAL | Status: DC

## 2011-03-29 ENCOUNTER — Telehealth: Payer: Self-pay | Admitting: *Deleted

## 2011-03-29 NOTE — Telephone Encounter (Signed)
Prior auth is needed for celebrex, form is on your desk. 

## 2011-03-29 NOTE — Telephone Encounter (Signed)
Done. In my outbox.

## 2011-05-31 ENCOUNTER — Other Ambulatory Visit: Payer: Self-pay | Admitting: *Deleted

## 2011-05-31 NOTE — Telephone Encounter (Signed)
Pt needs 90 day rx, he has f/u in Jan 2013.

## 2011-06-01 HISTORY — PX: COLONOSCOPY: SHX174

## 2011-06-01 MED ORDER — CELECOXIB 200 MG PO CAPS: 200.0000 mg | ORAL_CAPSULE | Freq: Every day | ORAL | Status: DC

## 2011-06-07 ENCOUNTER — Other Ambulatory Visit: Payer: Self-pay | Admitting: Family Medicine

## 2011-06-07 DIAGNOSIS — E119 Type 2 diabetes mellitus without complications: Secondary | ICD-10-CM

## 2011-06-15 ENCOUNTER — Other Ambulatory Visit (INDEPENDENT_AMBULATORY_CARE_PROVIDER_SITE_OTHER): Payer: Federal, State, Local not specified - PPO

## 2011-06-15 DIAGNOSIS — E119 Type 2 diabetes mellitus without complications: Secondary | ICD-10-CM

## 2011-06-15 LAB — COMPREHENSIVE METABOLIC PANEL
AST: 20 U/L (ref 0–37)
Albumin: 4.1 g/dL (ref 3.5–5.2)
BUN: 21 mg/dL (ref 6–23)
Calcium: 9 mg/dL (ref 8.4–10.5)
Chloride: 105 mEq/L (ref 96–112)
Glucose, Bld: 133 mg/dL — ABNORMAL HIGH (ref 70–99)
Potassium: 4.3 mEq/L (ref 3.5–5.1)

## 2011-06-15 LAB — LIPID PANEL
Cholesterol: 146 mg/dL (ref 0–200)
LDL Cholesterol: 82 mg/dL (ref 0–99)

## 2011-06-22 ENCOUNTER — Encounter: Payer: Federal, State, Local not specified - PPO | Admitting: Family Medicine

## 2011-06-24 ENCOUNTER — Encounter: Payer: Self-pay | Admitting: Family Medicine

## 2011-06-24 ENCOUNTER — Ambulatory Visit (INDEPENDENT_AMBULATORY_CARE_PROVIDER_SITE_OTHER): Payer: Federal, State, Local not specified - PPO | Admitting: Family Medicine

## 2011-06-24 VITALS — BP 132/80 | HR 79 | Temp 98.4°F | Wt 186.0 lb

## 2011-06-24 DIAGNOSIS — Z125 Encounter for screening for malignant neoplasm of prostate: Secondary | ICD-10-CM

## 2011-06-24 DIAGNOSIS — E785 Hyperlipidemia, unspecified: Secondary | ICD-10-CM | POA: Diagnosis not present

## 2011-06-24 DIAGNOSIS — E119 Type 2 diabetes mellitus without complications: Secondary | ICD-10-CM

## 2011-06-24 DIAGNOSIS — Z23 Encounter for immunization: Secondary | ICD-10-CM

## 2011-06-24 MED ORDER — SILDENAFIL CITRATE 50 MG PO TABS: ORAL_TABLET | ORAL | Status: DC

## 2011-06-24 MED ORDER — METFORMIN HCL 1000 MG PO TABS: 1000.0000 mg | ORAL_TABLET | Freq: Two times a day (BID) | ORAL | Status: DC

## 2011-06-24 MED ORDER — PRAVASTATIN SODIUM 40 MG PO TABS: 40.0000 mg | ORAL_TABLET | Freq: Every evening | ORAL | Status: DC

## 2011-06-24 MED ORDER — RAMIPRIL 5 MG PO CAPS: 5.0000 mg | ORAL_CAPSULE | Freq: Every day | ORAL | Status: DC

## 2011-06-24 MED ORDER — CELECOXIB 200 MG PO CAPS: 200.0000 mg | ORAL_CAPSULE | Freq: Every day | ORAL | Status: DC

## 2011-06-24 MED ORDER — PIOGLITAZONE HCL 45 MG PO TABS: 45.0000 mg | ORAL_TABLET | Freq: Every day | ORAL | Status: DC

## 2011-06-24 NOTE — Patient Instructions (Addendum)
I would get a flu shot each fall.  I'd get one soon (at the pharmacy).   Try to get back into your walking routine.  Recheck A1c in 6 months with OV a few days later.  Take care.  Glad to see you.

## 2011-06-24 NOTE — Progress Notes (Signed)
Diabetes:  Using medications without difficulties:yes Hypoglycemic episodes:no Hyperglycemic episodes:no Feet problems:no Blood Sugars averaging: ~120s-140s eye exam within last year: due for recheck.  He'll call about this.  He had been walking as much with family illnesses.   We talked about actos.  At this point, there is no clear evidence to direct a change in meds (re: actos).  Med is tolerated (no h/o bladder CA and no known h/o blood in urine) and benefit from control of DM2 outweighs other considerations.  Pt agrees to continue actos.  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  On celebrex.  Occ leg pain.  He'll f/u with ortho.    Hypertension:    Using medication without problems or lightheadedness: yes Chest pain with exertion:no Edema:no Short of breath:no  Elevated Cholesterol: Using medications without problems: yes Muscle aches: mild, occ Diet compliance:yes Exercise: limited recently  PMH and SH reviewed.   Vital signs, Meds and allergies reviewed.  ROS: See HPI.  Otherwise nontributory.   GEN: nad, alert and oriented HEENT: mucous membranes moist NECK: supple w/o LA CV: rrr.  no murmur PULM: ctab, no inc wob ABD: soft, +bs EXT: no edema SKIN: no acute rash Prostate gland firm and smooth, no enlargement, nodularity, tenderness, mass, asymmetry or induration.  Diabetic foot exam: Normal inspection No skin breakdown No calluses  Normal DP pulses Normal sensation to light tough and monofilament Nails normal

## 2011-06-26 ENCOUNTER — Encounter: Payer: Self-pay | Admitting: Family Medicine

## 2011-06-26 DIAGNOSIS — R972 Elevated prostate specific antigen [PSA]: Secondary | ICD-10-CM | POA: Insufficient documentation

## 2011-06-26 DIAGNOSIS — E785 Hyperlipidemia, unspecified: Secondary | ICD-10-CM | POA: Insufficient documentation

## 2011-06-26 NOTE — Assessment & Plan Note (Signed)
Controlled , no change in meds 

## 2011-06-26 NOTE — Assessment & Plan Note (Signed)
Sig social upheaval affected his exercise routine.  Work on diet and exercise, no change in meds and f/u for repeat A1c.  Labs d/w pt.  He agrees. >25 min spent with face to face with patient, >50% counseling and/or coordinating care

## 2011-06-26 NOTE — Assessment & Plan Note (Signed)
Neg FH and DRE unremarkable.  PSA options were discussed along with recent recs.  No indication for psa at this point, since patient is low risk and there is no FH of prosate CA.  He declined testing of PSA.

## 2011-07-07 DIAGNOSIS — E119 Type 2 diabetes mellitus without complications: Secondary | ICD-10-CM | POA: Diagnosis not present

## 2011-12-13 ENCOUNTER — Other Ambulatory Visit: Payer: Medicare Other

## 2011-12-16 ENCOUNTER — Other Ambulatory Visit (INDEPENDENT_AMBULATORY_CARE_PROVIDER_SITE_OTHER): Payer: Federal, State, Local not specified - PPO

## 2011-12-16 DIAGNOSIS — E119 Type 2 diabetes mellitus without complications: Secondary | ICD-10-CM

## 2011-12-23 ENCOUNTER — Encounter: Payer: Self-pay | Admitting: Family Medicine

## 2011-12-23 ENCOUNTER — Ambulatory Visit (INDEPENDENT_AMBULATORY_CARE_PROVIDER_SITE_OTHER): Payer: Federal, State, Local not specified - PPO | Admitting: Family Medicine

## 2011-12-23 VITALS — BP 110/60 | HR 79 | Temp 97.8°F | Ht 65.5 in | Wt 160.0 lb

## 2011-12-23 DIAGNOSIS — E119 Type 2 diabetes mellitus without complications: Secondary | ICD-10-CM

## 2011-12-23 MED ORDER — PIOGLITAZONE HCL 45 MG PO TABS: 22.5000 mg | ORAL_TABLET | Freq: Every day | ORAL | Status: DC

## 2011-12-23 NOTE — Progress Notes (Signed)
He is still having some hip discomfort and he'll f/u with ortho about this.   His mother died 17-Aug-2011.  Daughter was in the hospital twice, septic both times.  She's had to have IVIG.  She was recently engaged.    Diabetes: Using medications without difficulties:yes Hypoglycemic episodes:no Hyperglycemic episodes:no Feet problems:no Blood Sugars averaging: ~80-90 fasting eye exam within last year: yes, normal exam per patient . He's losing weight intentionally (along with this wife) and A1c is down to 6.1.  We had discussed actos prev and will continue at half dose.   He still isn't walking as much as prev, but he'll work on this.    Meds, vitals, and allergies reviewed.   ROS: See HPI.  Otherwise negative.    GEN: nad, alert and oriented NECK: supple w/o LA CV: rrr. PULM: ctab, no inc wob ABD: soft, +bs EXT: no edema SKIN: no acute rash  Diabetic foot exam: Normal inspection No skin breakdown No calluses  Normal DP pulses Normal sensation to light touch and monofilament Nails normal

## 2011-12-23 NOTE — Patient Instructions (Addendum)
Cut back to 22.5 mg of actos and recheck labs before a physical in early 2014.   Take care.  Glad to see you.

## 2011-12-23 NOTE — Assessment & Plan Note (Signed)
Much improved, continue meds but cut back to 22.5mg  of actos.  Recheck in early 2014.  I thanked him for his efforts.

## 2012-01-13 ENCOUNTER — Other Ambulatory Visit: Payer: Self-pay | Admitting: *Deleted

## 2012-01-13 MED ORDER — PRAVASTATIN SODIUM 40 MG PO TABS: 40.0000 mg | ORAL_TABLET | Freq: Every evening | ORAL | Status: DC

## 2012-03-08 DIAGNOSIS — IMO0002 Reserved for concepts with insufficient information to code with codable children: Secondary | ICD-10-CM | POA: Diagnosis not present

## 2012-06-09 ENCOUNTER — Other Ambulatory Visit: Payer: Self-pay | Admitting: Family Medicine

## 2012-06-09 DIAGNOSIS — E119 Type 2 diabetes mellitus without complications: Secondary | ICD-10-CM

## 2012-06-18 ENCOUNTER — Other Ambulatory Visit (INDEPENDENT_AMBULATORY_CARE_PROVIDER_SITE_OTHER): Payer: Medicare Other

## 2012-06-18 DIAGNOSIS — E119 Type 2 diabetes mellitus without complications: Secondary | ICD-10-CM | POA: Diagnosis not present

## 2012-06-18 LAB — COMPREHENSIVE METABOLIC PANEL
ALT: 16 U/L (ref 0–53)
Albumin: 4 g/dL (ref 3.5–5.2)
CO2: 30 mEq/L (ref 19–32)
Calcium: 9.3 mg/dL (ref 8.4–10.5)
Chloride: 102 mEq/L (ref 96–112)
Creatinine, Ser: 1.2 mg/dL (ref 0.4–1.5)
GFR: 65.61 mL/min (ref >60.00)
Potassium: 4.8 mEq/L (ref 3.5–5.1)
Sodium: 139 mEq/L (ref 135–145)
Total Protein: 6.7 g/dL (ref 6.0–8.3)

## 2012-06-18 LAB — LIPID PANEL
Total CHOL/HDL Ratio: 3
Triglycerides: 48 mg/dL (ref 0.0–149.0)

## 2012-06-20 HISTORY — PX: PROSTATE BIOPSY: SHX241

## 2012-06-25 ENCOUNTER — Encounter: Payer: Self-pay | Admitting: Family Medicine

## 2012-06-25 ENCOUNTER — Ambulatory Visit (INDEPENDENT_AMBULATORY_CARE_PROVIDER_SITE_OTHER): Payer: Medicare Other | Admitting: Family Medicine

## 2012-06-25 VITALS — BP 102/62 | HR 83 | Temp 98.2°F | Ht 65.5 in | Wt 160.0 lb

## 2012-06-25 DIAGNOSIS — Z0000 Encounter for general adult medical examination without abnormal findings: Secondary | ICD-10-CM | POA: Diagnosis not present

## 2012-06-25 DIAGNOSIS — E119 Type 2 diabetes mellitus without complications: Secondary | ICD-10-CM | POA: Diagnosis not present

## 2012-06-25 DIAGNOSIS — E785 Hyperlipidemia, unspecified: Secondary | ICD-10-CM | POA: Diagnosis not present

## 2012-06-25 DIAGNOSIS — Z23 Encounter for immunization: Secondary | ICD-10-CM | POA: Diagnosis not present

## 2012-06-25 DIAGNOSIS — Z125 Encounter for screening for malignant neoplasm of prostate: Secondary | ICD-10-CM | POA: Diagnosis not present

## 2012-06-25 DIAGNOSIS — K219 Gastro-esophageal reflux disease without esophagitis: Secondary | ICD-10-CM

## 2012-06-25 MED ORDER — CELECOXIB 200 MG PO CAPS: 200.0000 mg | ORAL_CAPSULE | Freq: Every day | ORAL | Status: DC

## 2012-06-25 MED ORDER — PRAVASTATIN SODIUM 40 MG PO TABS: 40.0000 mg | ORAL_TABLET | Freq: Every evening | ORAL | Status: DC

## 2012-06-25 MED ORDER — PIOGLITAZONE HCL 45 MG PO TABS: 22.5000 mg | ORAL_TABLET | Freq: Every day | ORAL | Status: DC

## 2012-06-25 MED ORDER — METFORMIN HCL 1000 MG PO TABS: 1000.0000 mg | ORAL_TABLET | Freq: Two times a day (BID) | ORAL | Status: DC

## 2012-06-25 MED ORDER — RAMIPRIL 5 MG PO CAPS: 5.0000 mg | ORAL_CAPSULE | Freq: Every day | ORAL | Status: DC

## 2012-06-25 NOTE — Progress Notes (Signed)
I have personally reviewed the Medicare Annual Wellness questionnaire and have noted 1. The patient's medical and social history 2. Their use of alcohol, tobacco or illicit drugs 3. Their current medications and supplements 4. The patient's functional ability including ADL's, fall risks, home safety risks and hearing or visual             impairment. 5. Diet and physical activities 6. Evidence for depression or mood disorders  The patients weight, height, BMI have been recorded in the chart and visual acuity is per eye clinic.  I have made referrals, counseling and provided education to the patient based review of the above and I have provided the pt with a written personalized care plan for preventive services.  See scanned forms.  Routine anticipatory guidance given to patient.  See health maintenance. Flu today Shingles 2008 PNA 2013 Tetanus 2013 Colonoscopy 2013 Prostate cancer screening.  D/w pt about PSA.   We agreed to check it today.  DRE prev wnl.  Advance directive- he'll check his papers.  Wife would be designated if incapacitated.   Cognitive function addressed- see scanned forms- and if abnormal then additional documentation follows.   Diabetes:  Using medications without difficulties:yes Hypoglycemic episodes:no Hyperglycemic episodes:no Feet problems:no Blood Sugars averaging: ~100, occ lower A1c 6.5  Hypertension:    Using medication without problems or lightheadedness: yes Chest pain with exertion:no Edema:no Short of breath:no  Elevated Cholesterol: Using medications without problems:yes Muscle aches: mild Diet compliance:discussed Exercise: discussed  PMH and SH reviewed.   Vital signs, Meds and allergies reviewed.  ROS: See HPI.  Otherwise nontributory.   GEN: nad, alert and oriented HEENT: mucous membranes moist NECK: supple w/o LA CV: rrr.  PULM: ctab, no inc wob ABD: soft, +bs EXT: no edema SKIN: no acute rash  Diabetic foot  exam: Normal inspection No skin breakdown No calluses  Normal DP pulses Normal sensation to light tough and monofilament Nails normal

## 2012-06-25 NOTE — Patient Instructions (Signed)
Go to the lab on the way out.  We'll contact you with your lab report.  Take care.  Keep working on your diet and start back exercising.   Recheck in 6 months with A1c ahead of time.  Glad to see you.

## 2012-06-26 ENCOUNTER — Encounter: Payer: Self-pay | Admitting: Family Medicine

## 2012-06-26 DIAGNOSIS — Z0000 Encounter for general adult medical examination without abnormal findings: Secondary | ICD-10-CM | POA: Insufficient documentation

## 2012-06-26 NOTE — Assessment & Plan Note (Signed)
Controlled, he'll continue current meds and will work more on diet.  Labs d/w pt.  He agrees.

## 2012-06-26 NOTE — Assessment & Plan Note (Signed)
Controlled, he'll continue current meds and will work more on diet.  Labs d/w pt.  He agrees.  

## 2012-06-26 NOTE — Assessment & Plan Note (Signed)
With elevated PSA, will recheck in 1 month.  If still elevated, refer to uro. No sx and no FH.

## 2012-06-26 NOTE — Assessment & Plan Note (Signed)
Controlled with occ PPI use per patient report.  Doing well.

## 2012-06-26 NOTE — Assessment & Plan Note (Signed)
See scanned forms.  Routine anticipatory guidance given to patient.  See health maintenance. Flu today Shingles 2008 PNA 2013 Tetanus 2013 Colonoscopy 2013 Prostate cancer screening.  D/w pt about PSA.   We agreed to check it today.  DRE prev wnl.  Advance directive- he'll check his papers.  Wife would be designated if incapacitated.   Cognitive function addressed- see scanned forms- and if abnormal then additional documentation follows.

## 2012-07-17 DIAGNOSIS — E119 Type 2 diabetes mellitus without complications: Secondary | ICD-10-CM | POA: Diagnosis not present

## 2012-07-27 ENCOUNTER — Other Ambulatory Visit (INDEPENDENT_AMBULATORY_CARE_PROVIDER_SITE_OTHER): Payer: Medicare Other

## 2012-07-27 DIAGNOSIS — E119 Type 2 diabetes mellitus without complications: Secondary | ICD-10-CM | POA: Diagnosis not present

## 2012-07-27 DIAGNOSIS — Z125 Encounter for screening for malignant neoplasm of prostate: Secondary | ICD-10-CM | POA: Diagnosis not present

## 2012-07-30 ENCOUNTER — Telehealth: Payer: Self-pay | Admitting: Family Medicine

## 2012-07-30 DIAGNOSIS — Z125 Encounter for screening for malignant neoplasm of prostate: Secondary | ICD-10-CM

## 2012-07-30 NOTE — Telephone Encounter (Signed)
Orders are in   Thanks

## 2012-07-30 NOTE — Telephone Encounter (Addendum)
Pt dropped by the office and scheduled PSA lab appt, March 10,2014 a t 10:15am, per your previous conversation.  If you need to speak with him again, he can be reached at [phone].  Thank you

## 2012-08-27 ENCOUNTER — Other Ambulatory Visit (INDEPENDENT_AMBULATORY_CARE_PROVIDER_SITE_OTHER): Payer: Medicare Other

## 2012-08-27 DIAGNOSIS — Z125 Encounter for screening for malignant neoplasm of prostate: Secondary | ICD-10-CM

## 2012-08-27 LAB — PSA, MEDICARE: PSA: 4.66 ng/ml — ABNORMAL HIGH (ref 0.10–4.00)

## 2012-08-28 ENCOUNTER — Other Ambulatory Visit: Payer: Self-pay | Admitting: Family Medicine

## 2012-08-28 DIAGNOSIS — R972 Elevated prostate specific antigen [PSA]: Secondary | ICD-10-CM

## 2012-09-17 DIAGNOSIS — R972 Elevated prostate specific antigen [PSA]: Secondary | ICD-10-CM | POA: Diagnosis not present

## 2012-09-20 ENCOUNTER — Encounter: Payer: Self-pay | Admitting: Family Medicine

## 2012-09-25 ENCOUNTER — Telehealth: Payer: Self-pay | Admitting: Family Medicine

## 2012-09-25 ENCOUNTER — Encounter: Payer: Self-pay | Admitting: Family Medicine

## 2012-09-25 NOTE — Telephone Encounter (Signed)
I called pt.  I had heard that his wife had died recently.  I called to offer my support.  He thanked me.  Also he has f/u with uro pending re: prostate biopsy.

## 2012-10-01 ENCOUNTER — Telehealth: Payer: Self-pay

## 2012-10-01 NOTE — Telephone Encounter (Signed)
Dee pts daughter left v/m requesting prior auth to be done for Celebrex to [employer]. Form in Dr Lianne Bushy in box.

## 2012-10-01 NOTE — Telephone Encounter (Signed)
I'll work on the hard copy.  

## 2012-10-08 NOTE — Telephone Encounter (Signed)
Celebrex was approved; Annabelle Harman at [employer] said already received approval and med mailed out on 10/05/12. Pt notified by v/m.Letter on Dr Lianne Bushy desk for signature and scanning.

## 2012-11-15 DIAGNOSIS — IMO0002 Reserved for concepts with insufficient information to code with codable children: Secondary | ICD-10-CM | POA: Diagnosis not present

## 2012-11-15 DIAGNOSIS — R972 Elevated prostate specific antigen [PSA]: Secondary | ICD-10-CM | POA: Diagnosis not present

## 2012-12-17 ENCOUNTER — Other Ambulatory Visit (INDEPENDENT_AMBULATORY_CARE_PROVIDER_SITE_OTHER): Payer: Medicare Other

## 2012-12-17 DIAGNOSIS — E119 Type 2 diabetes mellitus without complications: Secondary | ICD-10-CM | POA: Diagnosis not present

## 2012-12-17 LAB — HEMOGLOBIN A1C: Hgb A1c MFr Bld: 6.7 % — ABNORMAL HIGH (ref 4.6–6.5)

## 2012-12-24 ENCOUNTER — Ambulatory Visit (INDEPENDENT_AMBULATORY_CARE_PROVIDER_SITE_OTHER): Payer: Medicare Other | Admitting: Family Medicine

## 2012-12-24 ENCOUNTER — Encounter: Payer: Self-pay | Admitting: Family Medicine

## 2012-12-24 VITALS — BP 104/66 | HR 82 | Temp 98.1°F | Wt 166.5 lb

## 2012-12-24 DIAGNOSIS — Z634 Disappearance and death of family member: Secondary | ICD-10-CM | POA: Insufficient documentation

## 2012-12-24 DIAGNOSIS — E119 Type 2 diabetes mellitus without complications: Secondary | ICD-10-CM

## 2012-12-24 DIAGNOSIS — R972 Elevated prostate specific antigen [PSA]: Secondary | ICD-10-CM | POA: Diagnosis not present

## 2012-12-24 NOTE — Patient Instructions (Addendum)
Recheck A1c in 11/14 before a visit.  Take care.  Glad to see you.

## 2012-12-24 NOTE — Assessment & Plan Note (Signed)
D/w pt about his current situation.  Support offered.  He has family support.  He'll call back as needed.  He is appropriate today.

## 2012-12-24 NOTE — Progress Notes (Signed)
Widowed in 2014.  Discussed.  He has started back walking, about 4.5 miles a day.  He is managing with the current situation.  "It's been different.  We did everything together.  It is getting some better.  I'm trying to make sure I'm busy."  His daughter is checking on him. His brothers are supportive.    Prostate biopsy done for elevated PSA.  Bx was inconclusive and he'll have f/u with uro about this.    Diabetes:  Using medications without difficulties: yes Hypoglycemic episodes:no Hyperglycemic episodes:no Feet problems:no Blood Sugars averaging: ~100 eye exam within last year: done in 10/14.  Exercise helped with his sugar.   "I don't know how to cook and I'm trying to work on that.  She did all the cooking."  PMH and SH reviewed  Meds, vitals, and allergies reviewed.   ROS: See HPI.  Otherwise negative.    GEN: nad, alert and oriented HEENT: mucous membranes moist NECK: supple w/o LA CV: rrr. PULM: ctab, no inc wob ABD: soft, +bs EXT: no edema SKIN: no acute rash  Diabetic foot exam: Normal inspection No skin breakdown No calluses  Normal DP pulses Normal sensation to light touch and monofilament Nails normal

## 2012-12-24 NOTE — Assessment & Plan Note (Signed)
Controlled, he'll try to work on diet and continue with exercise.  Recheck later in 2014.  Social upheaval noted.

## 2012-12-24 NOTE — Assessment & Plan Note (Signed)
Prostate biopsy done for elevated PSA.  Bx was inconclusive and he'll have f/u with uro about this.

## 2013-04-22 ENCOUNTER — Other Ambulatory Visit: Payer: Medicare Other

## 2013-04-25 ENCOUNTER — Other Ambulatory Visit: Payer: Self-pay

## 2013-04-29 ENCOUNTER — Ambulatory Visit: Payer: Medicare Other | Admitting: Family Medicine

## 2013-05-24 DIAGNOSIS — R972 Elevated prostate specific antigen [PSA]: Secondary | ICD-10-CM | POA: Diagnosis not present

## 2013-05-30 ENCOUNTER — Other Ambulatory Visit (HOSPITAL_COMMUNITY): Payer: Self-pay | Admitting: Urology

## 2013-05-30 DIAGNOSIS — Z0000 Encounter for general adult medical examination without abnormal findings: Secondary | ICD-10-CM | POA: Diagnosis not present

## 2013-05-30 DIAGNOSIS — R972 Elevated prostate specific antigen [PSA]: Secondary | ICD-10-CM | POA: Diagnosis not present

## 2013-06-05 ENCOUNTER — Ambulatory Visit (HOSPITAL_COMMUNITY)
Admission: RE | Admit: 2013-06-05 | Discharge: 2013-06-05 | Disposition: A | Discharge status: Home / Self Care | Payer: Medicare Other | Source: Ambulatory Visit | Attending: Urology | Admitting: Urology

## 2013-06-05 DIAGNOSIS — N4289 Other specified disorders of prostate: Secondary | ICD-10-CM | POA: Diagnosis not present

## 2013-06-05 DIAGNOSIS — R972 Elevated prostate specific antigen [PSA]: Secondary | ICD-10-CM | POA: Insufficient documentation

## 2013-06-05 DIAGNOSIS — N429 Disorder of prostate, unspecified: Secondary | ICD-10-CM | POA: Diagnosis not present

## 2013-06-05 MED ORDER — GADOBENATE DIMEGLUMINE 529 MG/ML IV SOLN
15.0000 mL | Freq: Once | INTRAVENOUS | Status: AC | PRN
  Administered 2013-06-05: 15 mL via INTRAVENOUS

## 2013-06-24 ENCOUNTER — Other Ambulatory Visit (INDEPENDENT_AMBULATORY_CARE_PROVIDER_SITE_OTHER): Payer: Medicare Other

## 2013-06-24 DIAGNOSIS — E119 Type 2 diabetes mellitus without complications: Secondary | ICD-10-CM | POA: Diagnosis not present

## 2013-06-24 LAB — HEMOGLOBIN A1C: Hgb A1c MFr Bld: 6.5 % (ref 4.6–6.5)

## 2013-07-01 ENCOUNTER — Ambulatory Visit: Payer: Medicare Other | Admitting: Family Medicine

## 2013-07-15 DIAGNOSIS — R972 Elevated prostate specific antigen [PSA]: Secondary | ICD-10-CM | POA: Diagnosis not present

## 2013-07-15 DIAGNOSIS — IMO0002 Reserved for concepts with insufficient information to code with codable children: Secondary | ICD-10-CM | POA: Diagnosis not present

## 2013-07-17 ENCOUNTER — Other Ambulatory Visit: Payer: Self-pay | Admitting: *Deleted

## 2013-07-17 ENCOUNTER — Encounter: Payer: Self-pay | Admitting: Family Medicine

## 2013-07-17 MED ORDER — CELECOXIB 200 MG PO CAPS: 200.0000 mg | ORAL_CAPSULE | Freq: Every day | ORAL | Status: DC

## 2013-07-17 NOTE — Telephone Encounter (Signed)
Sent!

## 2013-07-17 NOTE — Telephone Encounter (Signed)
Received faxed refill request from pharmacy. Last refill 06/25/12. Patient has an appointment scheduled 07/22/13. Is it okay to refill medication?

## 2013-07-18 ENCOUNTER — Other Ambulatory Visit: Payer: Self-pay | Admitting: *Deleted

## 2013-07-21 ENCOUNTER — Ambulatory Visit: Payer: Self-pay | Admitting: Urology

## 2013-07-21 DIAGNOSIS — Z9889 Other specified postprocedural states: Secondary | ICD-10-CM | POA: Diagnosis not present

## 2013-07-21 DIAGNOSIS — M1990 Unspecified osteoarthritis, unspecified site: Secondary | ICD-10-CM | POA: Diagnosis not present

## 2013-07-21 DIAGNOSIS — R972 Elevated prostate specific antigen [PSA]: Secondary | ICD-10-CM | POA: Diagnosis not present

## 2013-07-21 DIAGNOSIS — Z79899 Other long term (current) drug therapy: Secondary | ICD-10-CM | POA: Diagnosis not present

## 2013-07-21 DIAGNOSIS — E7800 Pure hypercholesterolemia, unspecified: Secondary | ICD-10-CM | POA: Diagnosis not present

## 2013-07-21 DIAGNOSIS — Z85828 Personal history of other malignant neoplasm of skin: Secondary | ICD-10-CM | POA: Diagnosis not present

## 2013-07-21 DIAGNOSIS — R338 Other retention of urine: Secondary | ICD-10-CM | POA: Diagnosis not present

## 2013-07-21 DIAGNOSIS — Z96649 Presence of unspecified artificial hip joint: Secondary | ICD-10-CM | POA: Diagnosis not present

## 2013-07-21 DIAGNOSIS — R310 Gross hematuria: Secondary | ICD-10-CM | POA: Diagnosis not present

## 2013-07-21 DIAGNOSIS — R319 Hematuria, unspecified: Secondary | ICD-10-CM | POA: Diagnosis not present

## 2013-07-21 DIAGNOSIS — N135 Crossing vessel and stricture of ureter without hydronephrosis: Secondary | ICD-10-CM | POA: Diagnosis not present

## 2013-07-21 DIAGNOSIS — IMO0002 Reserved for concepts with insufficient information to code with codable children: Secondary | ICD-10-CM | POA: Diagnosis not present

## 2013-07-21 DIAGNOSIS — K219 Gastro-esophageal reflux disease without esophagitis: Secondary | ICD-10-CM | POA: Diagnosis not present

## 2013-07-21 DIAGNOSIS — E119 Type 2 diabetes mellitus without complications: Secondary | ICD-10-CM | POA: Diagnosis not present

## 2013-07-21 DIAGNOSIS — N9989 Other postprocedural complications and disorders of genitourinary system: Secondary | ICD-10-CM | POA: Diagnosis not present

## 2013-07-21 DIAGNOSIS — N3289 Other specified disorders of bladder: Secondary | ICD-10-CM | POA: Diagnosis not present

## 2013-07-21 LAB — URINALYSIS, COMPLETE
Bacteria: NONE SEEN
Bilirubin,UR: NEGATIVE
Glucose,UR: 50 mg/dL (ref 0–75)
Ketone: NEGATIVE
LEUKOCYTE ESTERASE: NEGATIVE
Nitrite: NEGATIVE
PH: 6 (ref 4.5–8.0)
Protein: 100
Specific Gravity: 1.005 (ref 1.003–1.030)
Squamous Epithelial: 69
WBC UR: 1787 /HPF (ref 0–5)

## 2013-07-21 LAB — COMPREHENSIVE METABOLIC PANEL
ALBUMIN: 4 g/dL (ref 3.4–5.0)
ALK PHOS: 80 U/L
Anion Gap: 4 — ABNORMAL LOW (ref 7–16)
BILIRUBIN TOTAL: 0.4 mg/dL (ref 0.2–1.0)
BUN: 21 mg/dL — AB (ref 7–18)
CALCIUM: 9.4 mg/dL (ref 8.5–10.1)
CHLORIDE: 104 mmol/L (ref 98–107)
CO2: 28 mmol/L (ref 21–32)
Creatinine: 1.29 mg/dL (ref 0.60–1.30)
EGFR (African American): >60
EGFR (Non-African Amer.): 57 — ABNORMAL LOW
Glucose: 226 mg/dL — ABNORMAL HIGH (ref 65–99)
Osmolality: 282 (ref 275–301)
Potassium: 4 mmol/L (ref 3.5–5.1)
SGOT(AST): 27 U/L (ref 15–37)
SGPT (ALT): 23 U/L (ref 12–78)
SODIUM: 136 mmol/L (ref 136–145)
TOTAL PROTEIN: 7.3 g/dL (ref 6.4–8.2)

## 2013-07-21 LAB — CBC
HCT: 41.3 % (ref 40.0–52.0)
HGB: 14 g/dL (ref 13.0–18.0)
MCH: 32.5 pg (ref 26.0–34.0)
MCHC: 33.9 g/dL (ref 32.0–36.0)
MCV: 96 fL (ref 80–100)
PLATELETS: 289 10*3/uL (ref 150–440)
RBC: 4.31 10*6/uL — AB (ref 4.40–5.90)
RDW: 13.3 % (ref 11.5–14.5)
WBC: 10.1 10*3/uL (ref 3.8–10.6)

## 2013-07-22 ENCOUNTER — Observation Stay: Payer: Self-pay | Admitting: Urology

## 2013-07-22 ENCOUNTER — Ambulatory Visit: Payer: Medicare Other | Admitting: Family Medicine

## 2013-07-22 DIAGNOSIS — N135 Crossing vessel and stricture of ureter without hydronephrosis: Secondary | ICD-10-CM | POA: Diagnosis not present

## 2013-07-22 DIAGNOSIS — R319 Hematuria, unspecified: Secondary | ICD-10-CM | POA: Diagnosis not present

## 2013-07-22 DIAGNOSIS — R339 Retention of urine, unspecified: Secondary | ICD-10-CM | POA: Diagnosis not present

## 2013-07-22 LAB — CBC WITH DIFFERENTIAL/PLATELET
BASOS PCT: 0.5 %
Basophil #: 0 10*3/uL (ref 0.0–0.1)
Eosinophil #: 0.2 10*3/uL (ref 0.0–0.7)
Eosinophil %: 1.9 %
HCT: 38.8 % — ABNORMAL LOW (ref 40.0–52.0)
HGB: 13.3 g/dL (ref 13.0–18.0)
LYMPHS PCT: 28.9 %
Lymphocyte #: 2.5 10*3/uL (ref 1.0–3.6)
MCH: 32.8 pg (ref 26.0–34.0)
MCHC: 34.2 g/dL (ref 32.0–36.0)
MCV: 96 fL (ref 80–100)
MONO ABS: 0.8 x10 3/mm (ref 0.2–1.0)
Monocyte %: 9.3 %
NEUTROS ABS: 5 10*3/uL (ref 1.4–6.5)
NEUTROS PCT: 59.4 %
Platelet: 253 10*3/uL (ref 150–440)
RBC: 4.06 10*6/uL — AB (ref 4.40–5.90)
RDW: 13.3 % (ref 11.5–14.5)
WBC: 8.5 10*3/uL (ref 3.8–10.6)

## 2013-07-22 LAB — APTT: ACTIVATED PTT: 27.7 s (ref 23.6–35.9)

## 2013-07-22 LAB — PROTIME-INR
INR: 1
Prothrombin Time: 12.8 secs (ref 11.5–14.7)

## 2013-07-23 DIAGNOSIS — R319 Hematuria, unspecified: Secondary | ICD-10-CM | POA: Diagnosis not present

## 2013-07-23 DIAGNOSIS — R339 Retention of urine, unspecified: Secondary | ICD-10-CM | POA: Diagnosis not present

## 2013-07-25 DIAGNOSIS — R972 Elevated prostate specific antigen [PSA]: Secondary | ICD-10-CM | POA: Diagnosis not present

## 2013-07-28 ENCOUNTER — Encounter: Payer: Self-pay | Admitting: Family Medicine

## 2013-07-29 ENCOUNTER — Encounter: Payer: Self-pay | Admitting: Family Medicine

## 2013-07-29 ENCOUNTER — Ambulatory Visit (INDEPENDENT_AMBULATORY_CARE_PROVIDER_SITE_OTHER): Payer: Medicare Other | Admitting: Family Medicine

## 2013-07-29 VITALS — BP 116/78 | HR 86 | Temp 98.1°F | Wt 172.8 lb

## 2013-07-29 DIAGNOSIS — R972 Elevated prostate specific antigen [PSA]: Secondary | ICD-10-CM | POA: Diagnosis not present

## 2013-07-29 DIAGNOSIS — E119 Type 2 diabetes mellitus without complications: Secondary | ICD-10-CM | POA: Diagnosis not present

## 2013-07-29 MED ORDER — AMOXICILLIN 500 MG PO CAPS
2000.0000 mg | ORAL_CAPSULE | Freq: Every day | ORAL | Status: DC
Start: 2013-07-29 — End: 2015-03-02

## 2013-07-29 NOTE — Patient Instructions (Signed)
Schedule a physical for about 4 months from now. Labs ahead of time.  Stop the actos for now.  Work on exercise in the meantime.  Take care.  Glad to see you.

## 2013-07-29 NOTE — Progress Notes (Signed)
Pre-visit discussion using our clinic review tool. No additional management support is needed unless otherwise documented below in the visit note.  Had bleeding after prostate biopsy, in his urine. Was seen at [facility], had urinary retention, resolved with cath.   Requesting records.  No more bleeding in the meantime. Had uro f/u in the meantime and u/a was normal per patient, along with u/s bladder.    Diabetes:  Using medications without difficulties: yes Hypoglycemic episodes: no Hyperglycemic episodes: no Feet problems: no Blood Sugars averaging: ~100-110 eye exam within last year: upcoming, he'll call for appointment.   He's going to try to work on diet more. The holidays were tough for patient.    We talked about getting off actos.  He'll stop it.   Meds, vitals, and allergies reviewed.   ROS: See HPI.  Otherwise negative.    GEN: nad, alert and oriented HEENT: mucous membranes moist NECK: supple w/o LA CV: rrr. PULM: ctab, no inc wob ABD: soft, +bs EXT: no edema SKIN: no acute rash  Diabetic foot exam: Normal inspection No skin breakdown No calluses  Normal DP pulses Normal sensation to light touch and monofilament Nails normal

## 2013-07-30 NOTE — Assessment & Plan Note (Signed)
Per uro, see [place] noted.

## 2013-07-30 NOTE — Assessment & Plan Note (Signed)
Good control given the circumstances, would stop actos and continue metformin, continue work on diet and weight. He agrees.  See instructions.  Labs d/w pt.

## 2013-08-01 ENCOUNTER — Telehealth: Payer: Self-pay

## 2013-08-01 NOTE — Telephone Encounter (Signed)
Relevant patient education assigned to patient using Emmi. ° °

## 2013-08-09 ENCOUNTER — Other Ambulatory Visit: Payer: Self-pay | Admitting: Family Medicine

## 2013-08-14 DIAGNOSIS — H43819 Vitreous degeneration, unspecified eye: Secondary | ICD-10-CM | POA: Diagnosis not present

## 2013-08-19 ENCOUNTER — Other Ambulatory Visit: Payer: Self-pay | Admitting: Family Medicine

## 2013-10-25 ENCOUNTER — Other Ambulatory Visit: Payer: Medicare Other

## 2013-10-28 ENCOUNTER — Encounter: Payer: Medicare Other | Admitting: Family Medicine

## 2013-11-14 ENCOUNTER — Other Ambulatory Visit: Payer: Self-pay | Admitting: Family Medicine

## 2013-11-26 ENCOUNTER — Other Ambulatory Visit: Payer: Self-pay | Admitting: Family Medicine

## 2013-12-27 DIAGNOSIS — H0019 Chalazion unspecified eye, unspecified eyelid: Secondary | ICD-10-CM | POA: Diagnosis not present

## 2014-01-01 ENCOUNTER — Other Ambulatory Visit (INDEPENDENT_AMBULATORY_CARE_PROVIDER_SITE_OTHER): Payer: Medicare Other

## 2014-01-01 DIAGNOSIS — E119 Type 2 diabetes mellitus without complications: Secondary | ICD-10-CM | POA: Diagnosis not present

## 2014-01-01 DIAGNOSIS — R7989 Other specified abnormal findings of blood chemistry: Secondary | ICD-10-CM | POA: Diagnosis not present

## 2014-01-01 DIAGNOSIS — E785 Hyperlipidemia, unspecified: Secondary | ICD-10-CM | POA: Diagnosis not present

## 2014-01-01 LAB — LIPID PANEL
Cholesterol: 125 mg/dL (ref 0–200)
HDL: 52 mg/dL (ref >39.00)
LDL CALC: 64 mg/dL (ref 0–99)
NONHDL: 73
Total CHOL/HDL Ratio: 2
Triglycerides: 45 mg/dL (ref 0.0–149.0)
VLDL: 9 mg/dL (ref 0.0–40.0)

## 2014-01-01 LAB — HEMOGLOBIN A1C: Hgb A1c MFr Bld: 6.7 % — ABNORMAL HIGH (ref 4.6–6.5)

## 2014-01-01 LAB — COMPREHENSIVE METABOLIC PANEL
ALT: 16 U/L (ref 0–53)
AST: 22 U/L (ref 0–37)
Albumin: 4.2 g/dL (ref 3.5–5.2)
Alkaline Phosphatase: 57 U/L (ref 39–117)
BILIRUBIN TOTAL: 0.8 mg/dL (ref 0.2–1.2)
BUN: 18 mg/dL (ref 6–23)
CO2: 29 meq/L (ref 19–32)
Calcium: 9.3 mg/dL (ref 8.4–10.5)
Chloride: 103 mEq/L (ref 96–112)
Creatinine, Ser: 1.3 mg/dL (ref 0.4–1.5)
GFR: 60.54 mL/min (ref >60.00)
Glucose, Bld: 136 mg/dL — ABNORMAL HIGH (ref 70–99)
Potassium: 4.3 mEq/L (ref 3.5–5.1)
SODIUM: 138 meq/L (ref 135–145)
TOTAL PROTEIN: 6.8 g/dL (ref 6.0–8.3)

## 2014-01-06 ENCOUNTER — Telehealth: Payer: Self-pay | Admitting: Family Medicine

## 2014-01-06 NOTE — Telephone Encounter (Signed)
Diabetic Bundle.  Pt needs LDL check, has CPE on 7/22.  Left vm requesting pt to come in for lab appt tomorrow (7/21).

## 2014-01-08 ENCOUNTER — Ambulatory Visit (INDEPENDENT_AMBULATORY_CARE_PROVIDER_SITE_OTHER): Payer: Medicare Other | Admitting: Family Medicine

## 2014-01-08 ENCOUNTER — Encounter: Payer: Self-pay | Admitting: Family Medicine

## 2014-01-08 VITALS — BP 126/76 | HR 92 | Temp 98.1°F | Ht 65.5 in | Wt 168.0 lb

## 2014-01-08 DIAGNOSIS — Z0000 Encounter for general adult medical examination without abnormal findings: Secondary | ICD-10-CM | POA: Diagnosis not present

## 2014-01-08 DIAGNOSIS — E119 Type 2 diabetes mellitus without complications: Secondary | ICD-10-CM

## 2014-01-08 NOTE — Progress Notes (Signed)
Pre visit review using our clinic review tool, if applicable. No additional management support is needed unless otherwise documented below in the visit note.  I have personally reviewed the Medicare Annual Wellness questionnaire and have noted 1. The patient's medical and social history 2. Their use of alcohol, tobacco or illicit drugs 3. Their current medications and supplements 4. The patient's functional ability including ADL's, fall risks, home safety risks and hearing or visual             impairment. 5. Diet and physical activities 6. Evidence for depression or mood disorders  The patients weight, height, BMI have been recorded in the chart and visual acuity is per eye clinic.  I have made referrals, counseling and provided education to the patient based review of the above and I have provided the pt with a written personalized care plan for preventive services.  Provider list updated- see scanned forms.  Routine anticipatory guidance given to patient.  See health maintenance.  Flu 06/2013 Shingles 2008 PNA d/w pt.  Will return to get updated Tetanus 2013 Colonoscopy 2012 Prostate cancer screening per uro.  Cognitive function addressed- see scanned forms- and if abnormal then additional documentation follows.  He is working through the adjustment after the death of his wife.  Discussed.  Diabetes:  Using medications without difficulties:no Hypoglycemic episodes:no Hyperglycemic episodes:no Feet problems:no Blood Sugars averaging: controlled, 100-125 eye exam within last year:yes  B hip and R shoulder pain prev, both improved recently.    PMH and SH reviewed  Meds, vitals, and allergies reviewed.   ROS: See HPI.  Otherwise negative.    GEN: nad, alert and oriented HEENT: mucous membranes moist NECK: supple w/o LA CV: rrr. PULM: ctab, no inc wob ABD: soft, +bs EXT: no edema SKIN: no acute rash  Diabetic foot exam: Normal inspection No skin breakdown No calluses   Normal DP pulses Normal sensation to light touch and monofilament Nails normal

## 2014-01-08 NOTE — Patient Instructions (Signed)
Don't change your meds.  Recheck an A1c in about 6 months at a lab visit.  Call back about getting a prevnar 13 pneumonia shot.  Take care.  Glad to see you.

## 2014-01-09 NOTE — Assessment & Plan Note (Signed)
Controlled, no change in meds.  He'll work on diet and exercise.  He is trying to adjust to cooking for one. See instructions.

## 2014-01-09 NOTE — Assessment & Plan Note (Addendum)
Flu 06/2013 Shingles 2008 PNA d/w pt. Will return to get updated Tetanus 2013 Colonoscopy 2012 Prostate cancer screening per uro.  Cognitive function addressed- see scanned forms- and if abnormal then additional documentation follows.  He is working through the adjustment after the death of his wife.  Discussed.

## 2014-01-16 DIAGNOSIS — R972 Elevated prostate specific antigen [PSA]: Secondary | ICD-10-CM | POA: Diagnosis not present

## 2014-01-23 DIAGNOSIS — R972 Elevated prostate specific antigen [PSA]: Secondary | ICD-10-CM | POA: Diagnosis not present

## 2014-03-03 ENCOUNTER — Other Ambulatory Visit: Payer: Self-pay | Admitting: Family Medicine

## 2014-06-20 DIAGNOSIS — C61 Malignant neoplasm of prostate: Secondary | ICD-10-CM

## 2014-06-20 HISTORY — DX: Malignant neoplasm of prostate: C61

## 2014-07-11 ENCOUNTER — Other Ambulatory Visit: Payer: Medicare Other

## 2014-08-03 ENCOUNTER — Other Ambulatory Visit: Payer: Self-pay | Admitting: Family Medicine

## 2014-08-27 ENCOUNTER — Other Ambulatory Visit: Payer: Self-pay | Admitting: Family Medicine

## 2014-08-27 DIAGNOSIS — E119 Type 2 diabetes mellitus without complications: Secondary | ICD-10-CM

## 2014-09-03 ENCOUNTER — Other Ambulatory Visit (INDEPENDENT_AMBULATORY_CARE_PROVIDER_SITE_OTHER): Payer: Medicare Other

## 2014-09-03 DIAGNOSIS — E119 Type 2 diabetes mellitus without complications: Secondary | ICD-10-CM

## 2014-09-03 DIAGNOSIS — L03211 Cellulitis of face: Secondary | ICD-10-CM | POA: Diagnosis not present

## 2014-09-03 LAB — HEMOGLOBIN A1C: HEMOGLOBIN A1C: 7.5 % — AB (ref 4.6–6.5)

## 2014-09-05 ENCOUNTER — Other Ambulatory Visit: Payer: Self-pay | Admitting: Family Medicine

## 2014-09-09 ENCOUNTER — Encounter: Payer: Self-pay | Admitting: Family Medicine

## 2014-09-09 ENCOUNTER — Ambulatory Visit (INDEPENDENT_AMBULATORY_CARE_PROVIDER_SITE_OTHER): Payer: Medicare Other | Admitting: Family Medicine

## 2014-09-09 VITALS — BP 122/76 | HR 75 | Temp 98.7°F | Wt 171.2 lb

## 2014-09-09 DIAGNOSIS — E119 Type 2 diabetes mellitus without complications: Secondary | ICD-10-CM | POA: Diagnosis not present

## 2014-09-09 DIAGNOSIS — Z23 Encounter for immunization: Secondary | ICD-10-CM | POA: Diagnosis not present

## 2014-09-09 NOTE — Progress Notes (Signed)
Pre visit review using our clinic review tool, if applicable. No additional management support is needed unless otherwise documented below in the visit note.  Diabetes:  Using medications without difficulties: yes Hypoglycemic episodes:no Hyperglycemic episodes:no Feet problems: no Blood Sugars averaging: usually ~130 recently eye exam within last year: yes A1c d/w pt.  He had been off of exercise over the winter.  He is starting back on exercise.    He had a possibly ingrown hair on R side of chin.  He got seen at [facility]- "it looked like a golf ball" and he was started on keflex.  He is finishing the abx soon, tomorrow is the last dose.  Much improved in meantime.  It started draining in the meantime.    Meds, vitals, and allergies reviewed.   ROS: See HPI.  Otherwise negative.    GEN: nad, alert and oriented HEENT: mucous membranes moist NECK: supple w/o LA CV: rrr. PULM: ctab, no inc wob ABD: soft, +bs EXT: no edema SKIN: no acute rash but resolving lesion seen on R chin, local irritation but no draining pus.   Diabetic foot exam: Normal inspection No skin breakdown No calluses  Normal DP pulses Normal sensation to light touch and monofilament Nails normal

## 2014-09-09 NOTE — Patient Instructions (Signed)
Don't change your meds.  Recheck labs before a physical in about 4 months.  Glad to see you.  Keep using warm compresses on your chin.

## 2014-09-10 NOTE — Assessment & Plan Note (Addendum)
The facial lesion should heal well, he'll update me as needed.  Continue as is for that.  He'll work on diet and exercise, no change in meds.  He agrees.  Recheck labs before a visit in about 4 months.  He agrees. A1c up, d/w pt.

## 2014-09-17 ENCOUNTER — Other Ambulatory Visit: Payer: Self-pay | Admitting: Family Medicine

## 2014-10-11 NOTE — H&P (Signed)
Subjective/Chief Complaint Gross Hematuria with Inability to Void   History of Present Illness 69 y.o. WM s/p TRNBx Prostate one week ago by Dr. Tresa Moore [facility]). Had some bright red gross hematuria immediately afterwards that improved to rust-colored urine for 2 days, then resolved until 3am Saturday, 07/20/2013 - bright red, total stream, with clots. Gross, total hematuria continued with the development of voiding difficulty this afternoon with lower abs discomfort, prompting presentation to the [facility] ER. Denies dysuria, F/C, constipation/diarrhea, N/V.  In the [facility] ER, the initial pulse was 119 with BP 144/85, with lower abd pain at 5/10. This would improve to pulse 89, BP 105/59. with lower abd pain 0/10 after foley placement at 21:53 (624m grossly bloody urine) for a Bladder Scan PVR of 7012mat 20:54. Despite 5L of sterile saline CBI, bright red gross hematuria would recur with clots requiring manual irrigation when irrigation held. Hct (41.3%), WBC (10.1k), Plt (289k), Cr (1.29), UA with 144,761 rbc/hpf, 1787 wbc/hpf, 69 epi's.   Past History Pt reports suspicious atypia on TRUS/TRNBx in May for rising PSA (nL DRE). FH neg for Prostate Cancer.  Recent Prostate MRI was "abnL" per pt with continued rise in the PSA to "5" prompting the repeat biopsies one week ago.  Pt reports that he was told that his prostate was "small."  Pt reports that the biopsies from last week returned negative for malignancy.  Pt denies prior h/o LUTS.  Also, denies h/o urolithiasis or current or past tobacco use.  Pt has been on Actos for the past 3 years (d/w pt, the increased incidence of urothelial carcinoma of the urinary bladder in pt's on Actos for more than 2 yrs).   Past Medical Health Diabetes Mellitus, Other, Hypercholesterolemia, GERD, DJD Right Hip   Past Med/Surgical Hx:  Degenerative Joint Disease: s/p Right THR 15 yrs ago  Hypercholesterolemia:   Tonsillectomy:   Nose Surgery: MOHS Right Nare for  Basal Cell Carcinoma  Hip Replacement - Right: 15 yrs ago  ALLERGIES:  No Known Allergies:   HOME MEDICATIONS: Medication Instructions Status  metFORMIN 500 mg oral tablet 1 tab(s) orally 2 times a day Active  Actos 15 mg oral tablet 1 tab(s) orally once a day Active  CeleBREX 200 mg oral capsule 1 cap(s) orally once a day Active  ramipril 2.5 mg oral capsule 1 cap(s) orally once a day Active  Aspir 81 81 mg oral tablet 1 tab(s) orally once a day Active  pravastatin 40 mg oral tablet 1 tab(s) orally once a day (at bedtime) Active  PriLOSEC OTC 20 mg oral delayed release tablet 1 tab(s) orally once a day, As Needed Active   Family and Social History:  Family History Cancer  Mother - uncertain primary; Neg for Prostate Cancer   Social History negative tobacco, positive ETOH, occas beer   Place of Living Home   Review of Systems:  Subjective/Chief Complaint Gross Hematuria with Difficulty Voiding   Fever/Chills No   Cough No   Sputum No   Abdominal Pain Yes  lower Abd (resolved after foley placement)   Diarrhea No   Constipation No   Nausea/Vomiting No   SOB/DOE No   Chest Pain No   Dysuria No   Tolerating Diet Yes   Medications/Allergies Reviewed Medications/Allergies reviewed   Physical Exam:  GEN well developed, well nourished, no acute distress   HEENT pink conjunctivae, hearing intact to voice, dry oral mucosa, Oropharynx clear, good dentition   NECK supple  No masses  thyroid not tender  trachea midline   RESP normal resp effort  clear BS  no use of accessory muscles   CARD regular rate  no murmur  no carotid bruits  No LE edema  no JVD  no Rub   ABD denies tenderness  no liver/spleen enlargement  soft  normal BS  no Abdominal Bruits  no Adominal Mass   GU foley catheter in place  no superpubic tenderness  requiring moderate rate CBI to remain clear   LYMPH negative neck   EXTR negative cyanosis/clubbing, negative edema   SKIN normal to  palpation, No rashes, No ulcers, skin turgor good   NEURO negative rigidity, negative tremor, non-focal   PSYCH A+O to time, place, person, good insight   Lab Results: Hepatic:  01-Feb-15 18:15   Bilirubin, Total 0.4  Alkaline Phosphatase 80 (45-117 NOTE: New Reference Range 05/10/13)  SGPT (ALT) 23  SGOT (AST) 27  Total Protein, Serum 7.3  Albumin, Serum 4.0  Routine Chem:  01-Feb-15 18:15   Glucose, Serum  226  BUN  21  Creatinine (comp) 1.29  Sodium, Serum 136  Potassium, Serum 4.0  Chloride, Serum 104  CO2, Serum 28  Calcium (Total), Serum 9.4  Osmolality (calc) 282  eGFR (African American) >60  eGFR (Non-African American)  57 (eGFR values <71m/min/1.73 m2 may be an indication of chronic kidney disease (CKD). Calculated eGFR is useful in patients with stable renal function. The eGFR calculation will not be reliable in acutely ill patients when serum creatinine is changing rapidly. It is not useful in  patients on dialysis. The eGFR calculation may not be applicable to patients at the low and high extremes of body sizes, pregnant women, and vegetarians.)  Anion Gap  4  Routine UA:  01-Feb-15 17:58   Color (UA) Red  Clarity (UA) Cloudy  Glucose (UA) 50 mg/dL  Bilirubin (UA) Negative  Ketones (UA) Negative  Specific Gravity (UA) 1.005  Blood (UA) 2+  pH (UA) 6.0  Protein (UA) 100 mg/dL  Nitrite (UA) Negative  Leukocyte Esterase (UA) Negative (Result(s) reported on 21 Jul 2013 at 07:11PM.)  RBC (UA) 144761 /HPF  WBC (UA) 1787 /HPF  Bacteria (UA) NONE SEEN  Epithelial Cells (UA) 69 /HPF (Result(s) reported on 21 Jul 2013 at 07:11PM.)  Routine Hem:  01-Feb-15 18:15   WBC (CBC) 10.1  RBC (CBC)  4.31  Hemoglobin (CBC) 14.0  Hematocrit (CBC) 41.3  Platelet Count (CBC) 289 (Result(s) reported on 21 Jul 2013 at 06:30PM.)  MCV 96  MCH 32.5  MCHC 33.9  RDW 13.3    Assessment/Admission Diagnosis 1. Gross Hematuria s/p Prostate Biopsies with Clot Retention -  requiring CBI at a moderate rate (recurrent clot/foley occlusion with trials off CBI in the ER) -has been on ASA 855m Celebrex 20058malso, has been on Actos for 3 yrs  2. Clot Retention (PVR 600m82m see #1 -no prior LUTS; "small prostate" on TRUS, per pt  3. Rising PSA with suspicious atypia on prostate biopsies 10/2012 - repeat biopsies last week neg, per pt  4. NIDDM - HgbA1c <7%, per pt, on metformin and Actos; BG 229 in the ER (probable stress reaction) -on renal protective dose of ramipril  5. DJD Right Hip s/p THR 15 yrs ago - on celebrex daily  6. GERD - on Prilosec OTC prn  7. Hypercholesterolemia - no ASCVD, per pt; on pravastatin   Plan 1. Admit for NS CBI to prevent recurrent clot retention  2. Continue Home  Meds except for ASA and Celebrex  3. Prophylactic Cipro   Electronic Signatures: Darcella Cheshire (MD)  (Signed 02-Feb-15 01:31)  Authored: CHIEF COMPLAINT and HISTORY, PAST MEDICAL/SURGIAL HISTORY, ALLERGIES, HOME MEDICATIONS, FAMILY AND SOCIAL HISTORY, REVIEW OF SYSTEMS, PHYSICAL EXAM, LABS, ASSESSMENT AND PLAN   Last Updated: 02-Feb-15 01:31 by Darcella Cheshire (MD)

## 2014-10-11 NOTE — Consult Note (Signed)
Urology Consultation Report for Consultation: Gross hematuria with clot retention s/p prostate biopsies requiring CBI MD: Dr. Jacqualine Code [facility] ER)MD: Darcella Cheshire, MD Local Urologist: Dr. Tresa Moore [facility] in [place])   see the detailed Admission H&P.  Electronic Signatures: Darcella Cheshire (MD)  (Signed on 02-Feb-15 01:33)  Authored  Last Updated: 02-Feb-15 01:33 by Darcella Cheshire (MD)

## 2014-10-29 ENCOUNTER — Other Ambulatory Visit: Payer: Self-pay | Admitting: Family Medicine

## 2014-10-29 NOTE — Telephone Encounter (Signed)
Received refill request electronically. Last refill 08/04/14 #90 Last office visit 09/09/14 Is it okay to refill medication?

## 2014-10-30 NOTE — Telephone Encounter (Signed)
Sent. Thanks.   

## 2014-12-10 ENCOUNTER — Other Ambulatory Visit: Payer: Self-pay | Admitting: Family Medicine

## 2014-12-11 ENCOUNTER — Ambulatory Visit (INDEPENDENT_AMBULATORY_CARE_PROVIDER_SITE_OTHER): Payer: Medicare Other | Admitting: Family Medicine

## 2014-12-11 ENCOUNTER — Encounter: Payer: Self-pay | Admitting: Family Medicine

## 2014-12-11 VITALS — BP 122/66 | HR 78 | Temp 98.6°F | Wt 166.5 lb

## 2014-12-11 DIAGNOSIS — L03312 Cellulitis of back [any part except buttock]: Secondary | ICD-10-CM

## 2014-12-11 MED ORDER — DOXYCYCLINE HYCLATE 100 MG PO TABS: 100.0000 mg | ORAL_TABLET | Freq: Two times a day (BID) | ORAL | Status: DC

## 2014-12-11 NOTE — Progress Notes (Signed)
Pre visit review using our clinic review tool, if applicable. No additional management support is needed unless otherwise documented below in the visit note.  Eye exam due, d/w pt.  He is walking more and losing weight.   Skin lesion on back.  About 1 month ago he noted some itching.  It got crusted and then the top crust came off.  He scratched it 2 nights ago and noted it was red around the periphery.  No bleeding, no tick bite.    Meds, vitals, and allergies reviewed.   ROS: See HPI.  Otherwise, noncontributory.  nad ncat rrr ctab Skin with 6x9 cm red area on the L lateral shoulder blade.

## 2014-12-11 NOTE — Patient Instructions (Addendum)
Call about an eye exam.  Start doxycycline and update me if you aren't getting better or if you have fevers/chills/other concerns.  Take care.  Glad to see you.

## 2014-12-12 DIAGNOSIS — L0390 Cellulitis, unspecified: Secondary | ICD-10-CM | POA: Insufficient documentation

## 2014-12-12 NOTE — Assessment & Plan Note (Signed)
With or without a tick bite (in this case not certain) would still treat with doxy.  D/w pt.  Border marked, he'll update me if not improved.  He agrees.  Still okay for outpatient f/u.

## 2014-12-31 ENCOUNTER — Other Ambulatory Visit: Payer: Self-pay | Admitting: Family Medicine

## 2014-12-31 DIAGNOSIS — E119 Type 2 diabetes mellitus without complications: Secondary | ICD-10-CM

## 2015-01-09 ENCOUNTER — Other Ambulatory Visit (INDEPENDENT_AMBULATORY_CARE_PROVIDER_SITE_OTHER): Payer: Medicare Other

## 2015-01-09 DIAGNOSIS — E119 Type 2 diabetes mellitus without complications: Secondary | ICD-10-CM | POA: Diagnosis not present

## 2015-01-09 LAB — LIPID PANEL
Cholesterol: 134 mg/dL (ref 0–200)
HDL: 63.2 mg/dL (ref >39.00)
LDL CALC: 63 mg/dL (ref 0–99)
NONHDL: 70.8
Total CHOL/HDL Ratio: 2
Triglycerides: 40 mg/dL (ref 0.0–149.0)
VLDL: 8 mg/dL (ref 0.0–40.0)

## 2015-01-09 LAB — COMPREHENSIVE METABOLIC PANEL
ALBUMIN: 4.3 g/dL (ref 3.5–5.2)
ALT: 16 U/L (ref 0–53)
AST: 19 U/L (ref 0–37)
Alkaline Phosphatase: 60 U/L (ref 39–117)
BUN: 22 mg/dL (ref 6–23)
CALCIUM: 9.3 mg/dL (ref 8.4–10.5)
CO2: 29 meq/L (ref 19–32)
CREATININE: 1.46 mg/dL (ref 0.40–1.50)
Chloride: 104 mEq/L (ref 96–112)
GFR: 50.92 mL/min — AB (ref >60.00)
GLUCOSE: 116 mg/dL — AB (ref 70–99)
Potassium: 4.6 mEq/L (ref 3.5–5.1)
Sodium: 140 mEq/L (ref 135–145)
Total Bilirubin: 0.8 mg/dL (ref 0.2–1.2)
Total Protein: 6.4 g/dL (ref 6.0–8.3)

## 2015-01-09 LAB — HEMOGLOBIN A1C: Hgb A1c MFr Bld: 6.9 % — ABNORMAL HIGH (ref 4.6–6.5)

## 2015-01-13 ENCOUNTER — Encounter: Payer: Self-pay | Admitting: Family Medicine

## 2015-01-13 ENCOUNTER — Ambulatory Visit (INDEPENDENT_AMBULATORY_CARE_PROVIDER_SITE_OTHER): Payer: Medicare Other | Admitting: Family Medicine

## 2015-01-13 VITALS — BP 118/78 | HR 70 | Temp 98.3°F | Ht 65.5 in | Wt 165.0 lb

## 2015-01-13 DIAGNOSIS — E785 Hyperlipidemia, unspecified: Secondary | ICD-10-CM

## 2015-01-13 DIAGNOSIS — I10 Essential (primary) hypertension: Secondary | ICD-10-CM

## 2015-01-13 DIAGNOSIS — Z7189 Other specified counseling: Secondary | ICD-10-CM

## 2015-01-13 DIAGNOSIS — E119 Type 2 diabetes mellitus without complications: Secondary | ICD-10-CM

## 2015-01-13 DIAGNOSIS — Z0000 Encounter for general adult medical examination without abnormal findings: Secondary | ICD-10-CM

## 2015-01-13 NOTE — Progress Notes (Signed)
Pre visit review using our clinic review tool, if applicable. No additional management support is needed unless otherwise documented below in the visit note.  I have personally reviewed the relevant hx and have noted 1. The patient's medical and social history, see EMR.  2. Their use of alcohol, tobacco or illicit drugs, see EMR.  3. Their current medications and supplements, see EMR 4. The patient's functional ability including ADL's, fall risks, home safety risks and hearing or visual             Impairment.  He has not deficits.   5. Diet and physical activities d/w pt.  6. Evidence for depression or mood disorders- mood is good.    The patients weight, height, BMI have been recorded in the chart and visual acuity is per eye clinic.  I have made referrals, counseling and provided education to the patient based review of the above and I have provided the pt with a written personalized care plan for preventive services.  Provider list updated.  Routine anticipatory guidance given to patient.  See health maintenance.  Flu prev done Shingles 2008 PNA prev done. Tetanus 2013 Colonoscopy 2012 Prostate cancer screening per uro.  He has f/u pending.   2014 Prostate biopsy done for elevated PSA. Bx was inconclusive.  2015 repeat prostate biopsy neg.  Cognitive function addressed- and if abnormal then additional documentation follows.  He is working through the adjustment after the death of his wife. Discussed.  Living will d/w pt.  Would have Guy [name] designated if patient were incapacitated.   No falls.   Hearing is good.  Memory intact on testing- 3/3 recall.  A&O.    Diabetes:  Using medications without difficulties:no Hypoglycemic episodes:rare episodes, usually only if prolonged fasting.   Hyperglycemic episodes:no Feet problems:no Blood Sugars averaging: meter broke, needs a new rx.  Done at [facility].  Prev his sugars were 115-120 in AM, occ lower.   eye exam within last  year:has f/u pending with eye clinic.   A1c improved.   He's working on diet and exercise.    Elevated Cholesterol: Using medications without problems:see below.  Muscle aches: some aches.   Diet compliance:yes Exercise:yes  Hypertension:    Using medication without problems or lightheadedness: yes Chest pain with exertion:no Edema:no Short of breath:no  PMH and SH reviewed  Meds, vitals, and allergies reviewed.   ROS: See HPI.  Otherwise negative.    GEN: nad, alert and oriented HEENT: mucous membranes moist NECK: supple w/o LA CV: rrr. PULM: ctab, no inc wob ABD: soft, +bs EXT: no edema SKIN: no acute rash  Diabetic foot exam: Normal inspection No skin breakdown No calluses  Normal DP pulses Normal sensation to light touch and monofilament Nails normal

## 2015-01-13 NOTE — Patient Instructions (Addendum)
Stop the cholesterol medicine for 1-2 weeks and see if you feel better, with less aches.  Let me know how you are doing.  Recheck labs before a visit in about 6 months.   Take care.  Glad to see you.  Thanks for your effort.

## 2015-01-14 DIAGNOSIS — I10 Essential (primary) hypertension: Secondary | ICD-10-CM | POA: Insufficient documentation

## 2015-01-14 DIAGNOSIS — Z7189 Other specified counseling: Secondary | ICD-10-CM | POA: Insufficient documentation

## 2015-01-14 NOTE — Assessment & Plan Note (Addendum)
Flu prev done Shingles 2008 PNA prev done. Tetanus 2013 Colonoscopy 2012 Prostate cancer screening per uro.  He has f/u pending.   2014 Prostate biopsy done for elevated PSA. Bx was inconclusive.  2015 repeat prostate biopsy neg.  Cognitive function addressed- and if abnormal then additional documentation follows.  He is working through the adjustment after the death of his wife. Discussed.  Living will d/w pt.  Would have [name] designated if patient were incapacitated.   No falls.   Hearing is good.  Memory intact on testing- 3/3 recall.  A&O.

## 2015-01-14 NOTE — Assessment & Plan Note (Signed)
Controlled, d/w pt. Continue as is, no change in meds.

## 2015-01-14 NOTE — Assessment & Plan Note (Signed)
Controlled, A1c improved, d/w pt. Continue as is, no change in meds. D/w pt about diet and exercise.  Recheck later on.  He agrees.  Rx sent to patient for DM2 meter.

## 2015-01-14 NOTE — Assessment & Plan Note (Signed)
With aches noted. Stop the statin for 1-2 weeks and he'll see if he feels better, with less aches. He'll let me know how he is doing.  Labs d/w pt.  Lipids controlled.

## 2015-01-19 DIAGNOSIS — R972 Elevated prostate specific antigen [PSA]: Secondary | ICD-10-CM | POA: Diagnosis not present

## 2015-02-02 DIAGNOSIS — R972 Elevated prostate specific antigen [PSA]: Secondary | ICD-10-CM | POA: Diagnosis not present

## 2015-02-03 ENCOUNTER — Other Ambulatory Visit: Payer: Self-pay | Admitting: Urology

## 2015-03-02 ENCOUNTER — Encounter (HOSPITAL_BASED_OUTPATIENT_CLINIC_OR_DEPARTMENT_OTHER): Payer: Self-pay | Admitting: *Deleted

## 2015-03-02 NOTE — Progress Notes (Addendum)
NPO AFTER MN.  ARRIVE AT 0930.  NEEDS ISTAT 8   AND EKG.  WILL TAKE PRILOSEC AM DOS W/ SIPS OF WATER AND DO FLEET ENEMA.

## 2015-03-06 ENCOUNTER — Encounter (HOSPITAL_BASED_OUTPATIENT_CLINIC_OR_DEPARTMENT_OTHER): Payer: Self-pay | Admitting: *Deleted

## 2015-03-06 ENCOUNTER — Ambulatory Visit (HOSPITAL_BASED_OUTPATIENT_CLINIC_OR_DEPARTMENT_OTHER): Payer: Medicare Other | Admitting: Anesthesiology

## 2015-03-06 ENCOUNTER — Encounter (HOSPITAL_BASED_OUTPATIENT_CLINIC_OR_DEPARTMENT_OTHER)
Admission: RE | Disposition: A | Discharge status: Home / Self Care | Payer: Self-pay | Source: Ambulatory Visit | Attending: Urology

## 2015-03-06 ENCOUNTER — Ambulatory Visit (HOSPITAL_BASED_OUTPATIENT_CLINIC_OR_DEPARTMENT_OTHER)
Admission: RE | Admit: 2015-03-06 | Discharge: 2015-03-06 | Disposition: A | Discharge status: Home / Self Care | Payer: Medicare Other | Source: Ambulatory Visit | Attending: Urology | Admitting: Urology

## 2015-03-06 DIAGNOSIS — K449 Diaphragmatic hernia without obstruction or gangrene: Secondary | ICD-10-CM | POA: Diagnosis not present

## 2015-03-06 DIAGNOSIS — I10 Essential (primary) hypertension: Secondary | ICD-10-CM | POA: Diagnosis not present

## 2015-03-06 DIAGNOSIS — Z96641 Presence of right artificial hip joint: Secondary | ICD-10-CM | POA: Insufficient documentation

## 2015-03-06 DIAGNOSIS — R972 Elevated prostate specific antigen [PSA]: Secondary | ICD-10-CM | POA: Insufficient documentation

## 2015-03-06 DIAGNOSIS — E119 Type 2 diabetes mellitus without complications: Secondary | ICD-10-CM | POA: Insufficient documentation

## 2015-03-06 DIAGNOSIS — K219 Gastro-esophageal reflux disease without esophagitis: Secondary | ICD-10-CM | POA: Insufficient documentation

## 2015-03-06 DIAGNOSIS — C61 Malignant neoplasm of prostate: Secondary | ICD-10-CM | POA: Insufficient documentation

## 2015-03-06 DIAGNOSIS — Z85828 Personal history of other malignant neoplasm of skin: Secondary | ICD-10-CM | POA: Diagnosis not present

## 2015-03-06 DIAGNOSIS — M1990 Unspecified osteoarthritis, unspecified site: Secondary | ICD-10-CM | POA: Insufficient documentation

## 2015-03-06 HISTORY — PX: PROSTATE BIOPSY: SHX241

## 2015-03-06 HISTORY — DX: Elevated prostate specific antigen (PSA): R97.20

## 2015-03-06 HISTORY — DX: Presence of spectacles and contact lenses: Z97.3

## 2015-03-06 HISTORY — DX: Personal history of other malignant neoplasm of skin: Z85.828

## 2015-03-06 HISTORY — DX: Personal history of other diseases of the digestive system: Z87.19

## 2015-03-06 HISTORY — DX: Other specified postprocedural states: Z98.890

## 2015-03-06 LAB — POCT I-STAT, CHEM 8
BUN: 21 mg/dL — AB (ref 6–20)
CALCIUM ION: 1.23 mmol/L (ref 1.13–1.30)
CREATININE: 1.4 mg/dL — AB (ref 0.61–1.24)
Chloride: 101 mmol/L (ref 101–111)
GLUCOSE: 156 mg/dL — AB (ref 65–99)
HCT: 46 % (ref 39.0–52.0)
Hemoglobin: 15.6 g/dL (ref 13.0–17.0)
Potassium: 4.3 mmol/L (ref 3.5–5.1)
Sodium: 142 mmol/L (ref 135–145)
TCO2: 28 mmol/L (ref 0–100)

## 2015-03-06 LAB — GLUCOSE, CAPILLARY: GLUCOSE-CAPILLARY: 140 mg/dL — AB (ref 65–99)

## 2015-03-06 SURGERY — BIOPSY, PROSTATE, RECTAL APPROACH, WITH US GUIDANCE
Anesthesia: General | Site: Prostate

## 2015-03-06 MED ORDER — FENTANYL CITRATE (PF) 100 MCG/2ML IJ SOLN
INTRAMUSCULAR | Status: DC | PRN
  Administered 2015-03-06 (×2): 25 ug via INTRAVENOUS
  Administered 2015-03-06: 50 ug via INTRAVENOUS

## 2015-03-06 MED ORDER — GENTAMICIN IN SALINE 1.6-0.9 MG/ML-% IV SOLN
80.0000 mg | INTRAVENOUS | Status: DC
  Filled 2015-03-06: qty 50

## 2015-03-06 MED ORDER — TRAMADOL HCL 50 MG PO TABS: 100.0000 mg | ORAL_TABLET | Freq: Four times a day (QID) | ORAL | Status: DC | PRN

## 2015-03-06 MED ORDER — FENTANYL CITRATE (PF) 100 MCG/2ML IJ SOLN
INTRAMUSCULAR | Status: AC
  Filled 2015-03-06: qty 6

## 2015-03-06 MED ORDER — PROMETHAZINE HCL 25 MG/ML IJ SOLN
6.2500 mg | INTRAMUSCULAR | Status: DC | PRN
  Filled 2015-03-06: qty 1

## 2015-03-06 MED ORDER — FENTANYL CITRATE (PF) 100 MCG/2ML IJ SOLN
25.0000 ug | INTRAMUSCULAR | Status: DC | PRN
  Filled 2015-03-06: qty 1

## 2015-03-06 MED ORDER — MIDAZOLAM HCL 2 MG/2ML IJ SOLN
INTRAMUSCULAR | Status: AC
  Filled 2015-03-06: qty 2

## 2015-03-06 MED ORDER — MIDAZOLAM HCL 5 MG/5ML IJ SOLN
INTRAMUSCULAR | Status: DC | PRN
  Administered 2015-03-06: 2 mg via INTRAVENOUS

## 2015-03-06 MED ORDER — LIDOCAINE HCL (CARDIAC) 20 MG/ML IV SOLN
INTRAVENOUS | Status: DC | PRN
  Administered 2015-03-06: 60 mg via INTRAVENOUS

## 2015-03-06 MED ORDER — DEXAMETHASONE SODIUM PHOSPHATE 4 MG/ML IJ SOLN
INTRAMUSCULAR | Status: DC | PRN
  Administered 2015-03-06: 10 mg via INTRAVENOUS

## 2015-03-06 MED ORDER — PROPOFOL 10 MG/ML IV BOLUS
INTRAVENOUS | Status: DC | PRN
  Administered 2015-03-06: 180 mg via INTRAVENOUS

## 2015-03-06 MED ORDER — ONDANSETRON HCL 4 MG/2ML IJ SOLN
INTRAMUSCULAR | Status: DC | PRN
  Administered 2015-03-06: 4 mg via INTRAVENOUS

## 2015-03-06 MED ORDER — LACTATED RINGERS IV SOLN
INTRAVENOUS | Status: DC
Start: 2015-03-06 — End: 2015-03-06
  Administered 2015-03-06: 10:00:00 via INTRAVENOUS
  Filled 2015-03-06: qty 1000

## 2015-03-06 MED ORDER — GENTAMICIN SULFATE 40 MG/ML IJ SOLN
5.0000 mg/kg | INTRAMUSCULAR | Status: AC
  Administered 2015-03-06: 370 mg via INTRAVENOUS
  Filled 2015-03-06: qty 9.25

## 2015-03-06 MED ORDER — LIDOCAINE HCL 2 % IJ SOLN
INTRAMUSCULAR | Status: DC | PRN
  Administered 2015-03-06: 10 mL

## 2015-03-06 SURGICAL SUPPLY — 17 items
BAG URINE DRAINAGE (UROLOGICAL SUPPLIES) IMPLANT
CATH FOLEY 2WAY SLVR  5CC 16FR (CATHETERS)
CATH FOLEY 2WAY SLVR 5CC 16FR (CATHETERS) IMPLANT
DRAPE LG THREE QUARTER DISP (DRAPES) IMPLANT
DRAPE UNDERBUTTOCKS STRL (DRAPE) IMPLANT
DRSG TEGADERM 4X4.75 (GAUZE/BANDAGES/DRESSINGS) IMPLANT
DRSG TEGADERM 8X12 (GAUZE/BANDAGES/DRESSINGS) IMPLANT
GLOVE BIO SURGEON STRL SZ7.5 (GLOVE) ×2 IMPLANT
HOLDER FOLEY CATH W/STRAP (MISCELLANEOUS) IMPLANT
LEGGING LITHOTOMY PAIR STRL (DRAPES) IMPLANT
NDL SAFETY ECLIPSE 18X1.5 (NEEDLE) ×1 IMPLANT
NEEDLE HYPO 18GX1.5 SHARP (NEEDLE) ×1
NEEDLE HYPO 22GX1.5 SAFETY (NEEDLE) IMPLANT
NEEDLE SPNL 22GX7 QUINCKE BK (NEEDLE) ×2 IMPLANT
SPONGE GAUZE 4X4 12PLY STER LF (GAUZE/BANDAGES/DRESSINGS) IMPLANT
SYR CONTROL 10ML LL (SYRINGE) ×2 IMPLANT
UNDERPAD 30X30 INCONTINENT (UNDERPADS AND DIAPERS) ×2 IMPLANT

## 2015-03-06 NOTE — Anesthesia Procedure Notes (Signed)
Procedure Name: LMA Insertion Date/Time: 03/06/2015 11:01 AM Performed by: Denna Haggard D Pre-anesthesia Checklist: Patient identified, Emergency Drugs available, Suction available and Patient being monitored Patient Re-evaluated:Patient Re-evaluated prior to inductionOxygen Delivery Method: Circle System Utilized Preoxygenation: Pre-oxygenation with 100% oxygen Intubation Type: IV induction Ventilation: Mask ventilation without difficulty LMA: LMA inserted LMA Size: 4.0 Number of attempts: 1 Airway Equipment and Method: Bite block Placement Confirmation: positive ETCO2 Tube secured with: Tape Dental Injury: Teeth and Oropharynx as per pre-operative assessment

## 2015-03-06 NOTE — Transfer of Care (Signed)
Immediate Anesthesia Transfer of Care Note  Patient: Guy [name]  Procedure(s) Performed: Procedure(s) (LRB): SATURATION BIOPSY TRANSRECTAL ULTRASONIC PROSTATE (TUBP) (N/A)  Patient Location: PACU  Anesthesia Type: General  Level of Consciousness: awake, oriented, sedated and patient cooperative  Airway & Oxygen Therapy: Patient Spontanous Breathing and Patient connected to face mask oxygen  Post-op Assessment: Report given to PACU RN and Post -op Vital signs reviewed and stable  Post vital signs: Reviewed and stable  Complications: No apparent anesthesia complications

## 2015-03-06 NOTE — Discharge Instructions (Signed)
1 - You may have urinary urgency (bladder spasms) and bloody urine and stool on / off for up to 1 week. This is normal.  2 - Call MD or go to ER for fever >102, severe pain / nausea / vomiting not relieved by medications, or acute change in medical status  Call your surgeon if you experience:   1.  Fever over 101.0. 2.  Inability to urinate. 3.  Nausea and/or vomiting. 4.  Extreme swelling or bruising at the surgical site. 5.  Continued bleeding from the incision. 6.  Increased pain, redness or drainage from the incision. 7.  Problems related to your pain medication. 8.  Any problems and/or concerns  Post Anesthesia Home Care Instructions  Activity: Get plenty of rest for the remainder of the day. A responsible adult should stay with you for 24 hours following the procedure.  For the next 24 hours, DO NOT: -Drive a car -Paediatric nurse -Drink alcoholic beverages -Take any medication unless instructed by your physician -Make any legal decisions or sign important papers.  Meals: Start with liquid foods such as gelatin or soup. Progress to regular foods as tolerated. Avoid greasy, spicy, heavy foods. If nausea and/or vomiting occur, drink only clear liquids until the nausea and/or vomiting subsides. Call your physician if vomiting continues.  Special Instructions/Symptoms: Your throat may feel dry or sore from the anesthesia or the breathing tube placed in your throat during surgery. If this causes discomfort, gargle with warm salt water. The discomfort should disappear within 24 hours.  If you had a scopolamine patch placed behind your ear for the management of post- operative nausea and/or vomiting:  1. The medication in the patch is effective for 72 hours, after which it should be removed.  Wrap patch in a tissue and discard in the trash. Wash hands thoroughly with soap and water. 2. You may remove the patch earlier than 72 hours if you experience unpleasant side effects which  may include dry mouth, dizziness or visual disturbances. 3. Avoid touching the patch. Wash your hands with soap and water after contact with the patch.

## 2015-03-06 NOTE — Brief Op Note (Signed)
03/06/2015  11:45 AM  PATIENT:  [name]  69 y.o. male  PRE-OPERATIVE DIAGNOSIS:  ELEVATED PROSTATIC SPECIFIC ANTIGEN  POST-OPERATIVE DIAGNOSIS:  ELEVATED PROSTATIC SPECIFIC ANTIGEN  PROCEDURE:  Procedure(s): SATURATION BIOPSY TRANSRECTAL ULTRASONIC PROSTATE (TUBP) (N/A)  SURGEON:  Surgeon(s) and Role:    * Alexis Frock, MD - Primary  PHYSICIAN ASSISTANT:   ASSISTANTS: none   ANESTHESIA:   local and general  EBL:  Total I/O In: 200 [I.V.:200] Out: -   BLOOD ADMINISTERED:none  DRAINS: none   LOCAL MEDICATIONS USED:  MARCAINE     SPECIMEN:  Source of Specimen:  30 prostate cores as per dictation.  DISPOSITION OF SPECIMEN:  PATHOLOGY  COUNTS:  YES  TOURNIQUET:  * No tourniquets in log *  DICTATION: .Other Dictation: Dictation Number [id number]  PLAN OF CARE: Discharge to home after PACU  PATIENT DISPOSITION:  PACU - hemodynamically stable.   Delay start of Pharmacological VTE agent (>24hrs) due to surgical blood loss or risk of bleeding: yes

## 2015-03-06 NOTE — H&P (Signed)
Guy [name] is an 69 y.o. male.    Chief Complaint: Pre-op Operative Prostate Saturation Biopsy  HPI:   1 - Elevated PSA - No FHX prostate cancer Recent Screening History: 2009 - 1.27 2011 - 2.58 10/2012 - 4.13 / DRE 25 gm --> NEGATIVE prostate biopsy (PIN/Atyp lateral, no asap or cancer) 05/2013 - PSA 5.65 / DRE / Prostate MRI multifocal base and apical possible lesions --> NEGATIVE prostate biopsy (PIN LLQ only) 01/2014 - PSA 5.52 01/2015 - PSA 7.13  PMH sig for well-controlled DM2 (A1C 6.5's), HTN, HLD, Rt hip replacement, basal cell CA s/p Moh's  Today Guy [name] is seen to proceed with prostate "saturaion" biopsy including transperineal anterior sampling for to furhter rule out prostate carcinoma. He has been on Augmentin per-procedurally.  Past Medical History  Diagnosis Date  . Anxiety   . GERD (gastroesophageal reflux disease)   . Diabetes mellitus type II   . Arthritis   . Elevated PSA   . History of basal cell carcinoma excision     1990's--  moh's surg. of nose  . History of hiatal hernia   . Wears contact lenses     Past Surgical History  Procedure Laterality Date  . Total hip arthroplasty Right 08/ 2000  . Esophagogastroduodenoscopy  07/29/05    [facility]  . Prostate biopsy  2014  . Tonsillectomy  as a child  . Colonoscopy  06-01-2011  . Mohs surgery  1990's    nose    Family History  Problem Relation Age of Onset  . COPD Mother     smoker  . Cancer Mother     unknown primary  . Alzheimer's disease Father   . Hypertension Father   . Diabetes Father   . Obesity Brother   . Hypertension Brother   . Diabetes Brother   . Heart disease Brother     heart valve  . Diabetes Brother   . Prostate cancer Neg Hx   . Colon cancer Neg Hx    Social History:  reports that he has never smoked. He has never used smokeless tobacco. He reports that he drinks alcohol. He reports that he does not use illicit drugs.  Allergies: No Known Allergies  No prescriptions  prior to admission    No results found for this or any previous visit (from the past 48 hour(s)). No results found.  Review of Systems  Constitutional: Negative.  Negative for fever and chills.  HENT: Negative.   Eyes: Negative.   Respiratory: Negative.   Cardiovascular: Negative.   Gastrointestinal: Negative.   Genitourinary: Negative.   Musculoskeletal: Negative.   Skin: Negative.   Neurological: Negative.   Endo/Heme/Allergies: Negative.   Psychiatric/Behavioral: Negative.     Height 5' 5.5" (1.664 m), weight 74.844 kg (165 lb). Physical Exam  Constitutional: He appears well-developed.  HENT:  Head: Normocephalic.  Eyes: Pupils are equal, round, and reactive to light.  Neck: Normal range of motion.  Cardiovascular: Normal rate.   Respiratory: Effort normal.  GI: Soft.  Genitourinary: Penis normal.  No CVAT  Musculoskeletal: Normal range of motion.  Neurological: He is alert.  Skin: Skin is warm.  Psychiatric: He has a normal mood and affect. His behavior is normal. Judgment and thought content normal.     Assessment/Plan  1 - Elevated PSA - negative biopsy x 2, including "cognitive targeted" reassuring, but PSA continues steady rise. I feel additional imaging likely would not be beneficial. Did discuss role of OR "saturation"  biopsy with double cores and dedicated transperineal anterior cores.   At this point he opts for saturation biopsy. Risks, benefits, alternatives discussed previously and rediscussed today.   Guy [name], Guy [name] 03/06/2015, 6:27 AM

## 2015-03-06 NOTE — Anesthesia Preprocedure Evaluation (Signed)
Anesthesia Evaluation  Patient identified by MRN, date of birth, ID band Patient awake    Reviewed: Allergy & Precautions, NPO status , Patient's Chart, lab work & pertinent test results  Airway Mallampati: II  TM Distance: >3 FB Neck ROM: Full    Dental   Pulmonary neg pulmonary ROS,    breath sounds clear to auscultation       Cardiovascular hypertension,  Rhythm:Regular Rate:Normal     Neuro/Psych negative neurological ROS     GI/Hepatic Neg liver ROS, GERD  Medicated,  Endo/Other  diabetes, Type 2, Oral Hypoglycemic Agents  Renal/GU negative Renal ROS     Musculoskeletal  (+) Arthritis ,   Abdominal   Peds  Hematology negative hematology ROS (+)   Anesthesia Other Findings   Reproductive/Obstetrics                             Anesthesia Physical Anesthesia Plan  ASA: II  Anesthesia Plan: General   Post-op Pain Management:    Induction: Intravenous  Airway Management Planned: LMA  Additional Equipment:   Intra-op Plan:   Post-operative Plan:   Informed Consent: I have reviewed the patients History and Physical, chart, labs and discussed the procedure including the risks, benefits and alternatives for the proposed anesthesia with the patient or authorized representative who has indicated his/her understanding and acceptance.     Plan Discussed with: CRNA  Anesthesia Plan Comments:         Anesthesia Quick Evaluation

## 2015-03-09 ENCOUNTER — Encounter (HOSPITAL_BASED_OUTPATIENT_CLINIC_OR_DEPARTMENT_OTHER): Payer: Self-pay | Admitting: Urology

## 2015-03-09 NOTE — Op Note (Signed)
NAMELAZARIUS, RIVKIN NO.:  [id number]  MEDICAL RECORD NO.:  [id number]  LOCATION:                               FACILITY:  [facility]  PHYSICIAN:  Alexis Frock, MD     DATE OF BIRTH:  Mar 05, 1946  DATE OF PROCEDURE:  03/06/2015 DATE OF DISCHARGE:  03/06/2015                              OPERATIVE REPORT   DIAGNOSIS:  Rising PSA, history of negative prostate biopsies.  PROCEDURE:  Recent prostate biopsy, transrectal ultrasound, and prostate block gross.  ESTIMATED BLOOD LOSS:  Nil.  COMPLICATIONS:  None.  SPECIMENS:  30 prostate cores as follows; right lateral base A, right lateral base B, right lateral mid A, right lateral mid B, right lateral apex A, right lateral apex B, right mid base A, right mid base B, right mid-mid A, right mid-mid B, right mid apex A, right mid apex B, left lateral base A, left lateral base B, left lateral mid A, left lateral mid B, left lateral apex A, left lateral apex B, left mid base A, left mid base B, left mid-mid A, left mid-mid B, left mid apex A, left mid apex B, right anterior lateral A, right anterior lateral B, left anterior A, left anterior B, right extreme anterior, left extreme interior; all for permanent pathology as separate specimens.  FINDINGS:  A 35.37 g prostate, length 4.46 cm, height 3.12 cm, width 4.84 cm.  INDICATION:  Mr. Mendiola is a very pleasant and vigorous 69 year old gentleman with history of a progressively rising PSA for several years. He has had a negative transrectal prostate biopsy x2 as well as an MRI. This is the second transrectal biopsy being based on findings of his MRI.  Despite this reassuring evaluation, he has had a continuous steady rise in his PSA.  We discussed options for further management including surveillance only versus repeat imaging versus repeat prostate biopsy with saturation technique in the operating room, specifically taking anterior course, and the patient wants to  proceed with the latter. Informed consent was obtained for same and placed in medical record. The patient had been on Augmentin preoperatively.  General anesthesia was introduced, LMA.  He was placed into a medium lithotomy position. His testicles and penis were taped out of the way of his perineum using extra large dressing.  Next, transrectal ultrasound was performed.  This revealed a 35.37 g prostate as per above.  No obvious down hypoechoic lesions.  Next, prostate block was performed using a 10 mL of 0.25% plain Marcaine with 2.5 mL administered at right lateral base, left lateral base, right lateral apex, left lateral apex respectively.  Next, a standard template prostate biopsy was performed as per above, but with A and B double cores of each section with the B cores being performed medial.  Visualizing the prostate and transperineal biopsies were performed to perform apical sampling, this performed of the right gland and left gland as well as extreme anterior as per above.  The perineum was prepped with alcohol and the needle cleaned with alcohol prior to perineal biopsies.  Following these maneuvers, hemostasis appeared excellent.  There was no obvious pulsatile bleeding per rectum or digital palpation.  The patient was awakened and taken to postanesthesia care unit in stable condition.          ______________________________ Alexis Frock, MD     TM/MEDQ  D:  03/06/2015  T:  03/06/2015  Job:  [id number]

## 2015-03-11 NOTE — Anesthesia Postprocedure Evaluation (Signed)
  Anesthesia Post-op Note  Patient: Guy [name]  Procedure(s) Performed: Procedure(s): SATURATION BIOPSY TRANSRECTAL ULTRASONIC PROSTATE (TUBP) (N/A)  Patient Location: PACU  Anesthesia Type:General  Level of Consciousness: awake and alert   Airway and Oxygen Therapy: Patient Spontanous Breathing  Post-op Pain: none  Post-op Assessment: Post-op Vital signs reviewed              Post-op Vital Signs: Reviewed  Last Vitals:  Filed Vitals:   03/06/15 1402  BP: 135/73  Pulse: 75  Temp: 36.9 C  Resp: 18    Complications: No apparent anesthesia complications

## 2015-03-18 ENCOUNTER — Other Ambulatory Visit: Payer: Self-pay | Admitting: Family Medicine

## 2015-03-19 DIAGNOSIS — C61 Malignant neoplasm of prostate: Secondary | ICD-10-CM | POA: Diagnosis not present

## 2015-03-22 ENCOUNTER — Encounter: Payer: Self-pay | Admitting: Family Medicine

## 2015-03-22 DIAGNOSIS — C61 Malignant neoplasm of prostate: Secondary | ICD-10-CM | POA: Insufficient documentation

## 2015-03-31 DIAGNOSIS — H5319 Other subjective visual disturbances: Secondary | ICD-10-CM | POA: Diagnosis not present

## 2015-04-02 ENCOUNTER — Other Ambulatory Visit: Payer: Self-pay | Admitting: Urology

## 2015-04-14 DIAGNOSIS — M6281 Muscle weakness (generalized): Secondary | ICD-10-CM | POA: Diagnosis not present

## 2015-04-14 DIAGNOSIS — C61 Malignant neoplasm of prostate: Secondary | ICD-10-CM | POA: Diagnosis not present

## 2015-04-14 DIAGNOSIS — R278 Other lack of coordination: Secondary | ICD-10-CM | POA: Diagnosis not present

## 2015-04-21 DIAGNOSIS — C61 Malignant neoplasm of prostate: Secondary | ICD-10-CM | POA: Diagnosis not present

## 2015-04-21 DIAGNOSIS — M6281 Muscle weakness (generalized): Secondary | ICD-10-CM | POA: Diagnosis not present

## 2015-04-21 DIAGNOSIS — R278 Other lack of coordination: Secondary | ICD-10-CM | POA: Diagnosis not present

## 2015-04-22 DIAGNOSIS — H43812 Vitreous degeneration, left eye: Secondary | ICD-10-CM | POA: Diagnosis not present

## 2015-04-28 DIAGNOSIS — C61 Malignant neoplasm of prostate: Secondary | ICD-10-CM | POA: Diagnosis not present

## 2015-04-28 DIAGNOSIS — M6281 Muscle weakness (generalized): Secondary | ICD-10-CM | POA: Diagnosis not present

## 2015-04-28 DIAGNOSIS — R278 Other lack of coordination: Secondary | ICD-10-CM | POA: Diagnosis not present

## 2015-04-29 NOTE — Patient Instructions (Addendum)
20 KENDERICK KOBLER  04/29/2015   Your procedure is scheduled on:   -05-06-2015 Wednesday  Enter through [facility]  Entrance and follow signs to [facility]. Arrive at    1000    AM.  (Limit 1 person with you).  Call this number if you have problems the morning of surgery: [phone]  Or Presurgical Testing [phone].   For Living Will and/or Health Care Power Attorney Forms: please provide copy for your medical record,may bring AM of surgery(Forms should be already notarized -we do not provide this service).(04-30-15 Yes, will bring information day of surgery).  Remember: Follow any bowel prep instructions per MD office.(Magnesium Citrate , 1 Fleet enema before bedtime night before) For Cpap use: Bring mask and tubing only.   Do not eat food/ or drink: After Midnight.     Take these medicines the morning of surgery with A SIP OF WATER-   (DO NOT TAKE ANY DIABETIC MEDS AM OF SURGERY) : Omeprazole(Prilosec).  Do not wear jewelry, make-up or nail polish.  Do not wear deodorant, lotions, powders, or perfumes.   Do not shave legs and under arms- 48 hours(2 days) prior to first CHG shower.(Shaving face and neck okay.)  Do not bring valuables to the hospital.(Hospital is not responsible for lost valuables).  Contacts, dentures or removable bridgework, body piercing, hair pins may not be worn into surgery.  Leave suitcase in the car. After surgery it may be brought to your room.  For patients admitted to the hospital, checkout time is 11:00 AM the day of discharge.(Restricted visitors-Any Persons displaying flu-like symptoms or illness).    Patients discharged the day of surgery will not be allowed to drive home. Must have responsible person with you x 24 hours once discharged.  Name and phone number of your driver: Modesta Messing, daughtder [phone] cell     Please read over the following fact sheets that you were given:  CHG(Chlorhexidine Gluconate 4% Surgical Soap)  use.  Remember : Type/Screen "Blue armbands" - may not be removed once applied(would result in being retested AM of surgery, if removed).         [facility] - Preparing for Surgery Before surgery, you can play an important role.  Because skin is not sterile, your skin needs to be as free of germs as possible.  You can reduce the number of germs on your skin by washing with CHG (chlorahexidine gluconate) soap before surgery.  CHG is an antiseptic cleaner which kills germs and bonds with the skin to continue killing germs even after washing. Please DO NOT use if you have an allergy to CHG or antibacterial soaps.  If your skin becomes reddened/irritated stop using the CHG and inform your nurse when you arrive at Short Stay. Do not shave (including legs and underarms) for at least 48 hours prior to the first CHG shower.  You may shave your face/neck. Please follow these instructions carefully:  1.  Shower with CHG Soap the night before surgery and the  morning of Surgery.  2.  If you choose to wash your hair, wash your hair first as usual with your  normal  shampoo.  3.  After you shampoo, rinse your hair and body thoroughly to remove the  shampoo.                           4.  Use CHG as you would any other liquid soap.  You can apply chg directly  to the skin and wash                       Gently with a scrungie or clean washcloth.  5.  Apply the CHG Soap to your body ONLY FROM THE NECK DOWN.   Do not use on face/ open                           Wound or open sores. Avoid contact with eyes, ears mouth and genitals (private parts).                       Wash face,  Genitals (private parts) with your normal soap.             6.  Wash thoroughly, paying special attention to the area where your surgery  will be performed.  7.  Thoroughly rinse your body with warm water from the neck down.  8.  DO NOT shower/wash with your normal soap after using and rinsing off  the CHG Soap.                9.  Pat  yourself dry with a clean towel.            10.  Wear clean pajamas.            11.  Place clean sheets on your bed the night of your first shower and do not  sleep with pets. Day of Surgery : Do not apply any lotions/deodorants the morning of surgery.  Please wear clean clothes to the hospital/surgery center.  FAILURE TO FOLLOW THESE INSTRUCTIONS MAY RESULT IN THE CANCELLATION OF YOUR SURGERY PATIENT SIGNATURE_________________________________  NURSE SIGNATURE__________________________________  ________________________________________________________________________

## 2015-04-30 ENCOUNTER — Encounter (HOSPITAL_COMMUNITY): Payer: Self-pay

## 2015-04-30 ENCOUNTER — Encounter (HOSPITAL_COMMUNITY)
Admission: RE | Admit: 2015-04-30 | Discharge: 2015-04-30 | Disposition: A | Discharge status: Home / Self Care | Payer: Medicare Other | Source: Ambulatory Visit | Attending: Urology | Admitting: Urology

## 2015-04-30 DIAGNOSIS — Z01818 Encounter for other preprocedural examination: Secondary | ICD-10-CM | POA: Diagnosis not present

## 2015-04-30 DIAGNOSIS — C61 Malignant neoplasm of prostate: Secondary | ICD-10-CM | POA: Insufficient documentation

## 2015-04-30 HISTORY — DX: Unspecified cataract: H26.9

## 2015-04-30 HISTORY — DX: Other general symptoms and signs: R68.89

## 2015-04-30 LAB — CBC
HEMATOCRIT: 42.9 % (ref 39.0–52.0)
HEMOGLOBIN: 14.6 g/dL (ref 13.0–17.0)
MCH: 31.8 pg (ref 26.0–34.0)
MCHC: 34 g/dL (ref 30.0–36.0)
MCV: 93.5 fL (ref 78.0–100.0)
Platelets: 304 10*3/uL (ref 150–400)
RBC: 4.59 MIL/uL (ref 4.22–5.81)
RDW: 12.9 % (ref 11.5–15.5)
WBC: 6.7 10*3/uL (ref 4.0–10.5)

## 2015-04-30 LAB — BASIC METABOLIC PANEL
ANION GAP: 7 (ref 5–15)
BUN: 22 mg/dL — ABNORMAL HIGH (ref 6–20)
CHLORIDE: 106 mmol/L (ref 101–111)
CO2: 27 mmol/L (ref 22–32)
Calcium: 9.6 mg/dL (ref 8.9–10.3)
Creatinine, Ser: 1.28 mg/dL — ABNORMAL HIGH (ref 0.61–1.24)
GFR calc Af Amer: >60 mL/min (ref >60)
GFR, EST NON AFRICAN AMERICAN: 55 mL/min — AB (ref >60)
GLUCOSE: 159 mg/dL — AB (ref 65–99)
POTASSIUM: 5 mmol/L (ref 3.5–5.1)
Sodium: 140 mmol/L (ref 135–145)

## 2015-04-30 LAB — ABO/RH: ABO/RH(D): O POS

## 2015-04-30 NOTE — Pre-Procedure Instructions (Signed)
EKG - 03-06-15 Epic.

## 2015-05-06 ENCOUNTER — Encounter (HOSPITAL_COMMUNITY)
Admission: RE | Disposition: A | Discharge status: Home / Self Care | Payer: Self-pay | Source: Ambulatory Visit | Attending: Urology

## 2015-05-06 ENCOUNTER — Inpatient Hospital Stay (HOSPITAL_COMMUNITY): Payer: Medicare Other | Admitting: Anesthesiology

## 2015-05-06 ENCOUNTER — Inpatient Hospital Stay (HOSPITAL_COMMUNITY)
Admission: RE | Admit: 2015-05-06 | Discharge: 2015-05-07 | DRG: 708 | Disposition: A | Discharge status: Home / Self Care | Payer: Medicare Other | Source: Ambulatory Visit | Attending: Urology | Admitting: Urology

## 2015-05-06 ENCOUNTER — Encounter (HOSPITAL_COMMUNITY): Payer: Self-pay | Admitting: *Deleted

## 2015-05-06 DIAGNOSIS — Z809 Family history of malignant neoplasm, unspecified: Secondary | ICD-10-CM | POA: Diagnosis not present

## 2015-05-06 DIAGNOSIS — Z01812 Encounter for preprocedural laboratory examination: Secondary | ICD-10-CM | POA: Diagnosis not present

## 2015-05-06 DIAGNOSIS — Z85828 Personal history of other malignant neoplasm of skin: Secondary | ICD-10-CM

## 2015-05-06 DIAGNOSIS — I10 Essential (primary) hypertension: Secondary | ICD-10-CM | POA: Diagnosis not present

## 2015-05-06 DIAGNOSIS — M1990 Unspecified osteoarthritis, unspecified site: Secondary | ICD-10-CM | POA: Diagnosis present

## 2015-05-06 DIAGNOSIS — Z820 Family history of epilepsy and other diseases of the nervous system: Secondary | ICD-10-CM

## 2015-05-06 DIAGNOSIS — Z8249 Family history of ischemic heart disease and other diseases of the circulatory system: Secondary | ICD-10-CM | POA: Diagnosis not present

## 2015-05-06 DIAGNOSIS — Z7984 Long term (current) use of oral hypoglycemic drugs: Secondary | ICD-10-CM | POA: Diagnosis not present

## 2015-05-06 DIAGNOSIS — K219 Gastro-esophageal reflux disease without esophagitis: Secondary | ICD-10-CM | POA: Diagnosis present

## 2015-05-06 DIAGNOSIS — Z825 Family history of asthma and other chronic lower respiratory diseases: Secondary | ICD-10-CM | POA: Diagnosis not present

## 2015-05-06 DIAGNOSIS — E119 Type 2 diabetes mellitus without complications: Secondary | ICD-10-CM | POA: Diagnosis present

## 2015-05-06 DIAGNOSIS — Z833 Family history of diabetes mellitus: Secondary | ICD-10-CM

## 2015-05-06 DIAGNOSIS — C61 Malignant neoplasm of prostate: Principal | ICD-10-CM | POA: Diagnosis present

## 2015-05-06 DIAGNOSIS — Z96641 Presence of right artificial hip joint: Secondary | ICD-10-CM | POA: Diagnosis present

## 2015-05-06 DIAGNOSIS — E785 Hyperlipidemia, unspecified: Secondary | ICD-10-CM | POA: Diagnosis not present

## 2015-05-06 HISTORY — PX: ROBOT ASSISTED LAPAROSCOPIC RADICAL PROSTATECTOMY: SHX5141

## 2015-05-06 HISTORY — PX: LYMPHADENECTOMY: SHX5960

## 2015-05-06 LAB — GLUCOSE, CAPILLARY
GLUCOSE-CAPILLARY: 189 mg/dL — AB (ref 65–99)
Glucose-Capillary: 188 mg/dL — ABNORMAL HIGH (ref 65–99)
Glucose-Capillary: 175 mg/dL — ABNORMAL HIGH (ref 65–99)

## 2015-05-06 LAB — TYPE AND SCREEN
ABO/RH(D): O POS
ANTIBODY SCREEN: NEGATIVE

## 2015-05-06 LAB — HEMOGLOBIN AND HEMATOCRIT, BLOOD
HEMATOCRIT: 37.4 % — AB (ref 39.0–52.0)
HEMOGLOBIN: 12.8 g/dL — AB (ref 13.0–17.0)

## 2015-05-06 SURGERY — ROBOTIC ASSISTED LAPAROSCOPIC RADICAL PROSTATECTOMY
Anesthesia: General

## 2015-05-06 MED ORDER — HYDROMORPHONE HCL 1 MG/ML IJ SOLN
INTRAMUSCULAR | Status: AC
  Administered 2015-05-06: 0.5 mg via INTRAVENOUS
  Filled 2015-05-06: qty 1

## 2015-05-06 MED ORDER — ONDANSETRON HCL 4 MG/2ML IJ SOLN
INTRAMUSCULAR | Status: DC | PRN
  Administered 2015-05-06: 4 mg via INTRAVENOUS

## 2015-05-06 MED ORDER — PHENYLEPHRINE HCL 10 MG/ML IJ SOLN
INTRAMUSCULAR | Status: DC | PRN
  Administered 2015-05-06 (×2): 80 ug via INTRAVENOUS

## 2015-05-06 MED ORDER — FENTANYL CITRATE (PF) 100 MCG/2ML IJ SOLN
INTRAMUSCULAR | Status: DC | PRN
  Administered 2015-05-06 (×2): 100 ug via INTRAVENOUS
  Administered 2015-05-06: 50 ug via INTRAVENOUS

## 2015-05-06 MED ORDER — SULFAMETHOXAZOLE-TRIMETHOPRIM 800-160 MG PO TABS: 1.0000 | ORAL_TABLET | Freq: Two times a day (BID) | ORAL | Status: DC

## 2015-05-06 MED ORDER — PANTOPRAZOLE SODIUM 40 MG PO TBEC
40.0000 mg | DELAYED_RELEASE_TABLET | Freq: Every day | ORAL | Status: DC
  Administered 2015-05-07: 40 mg via ORAL
  Filled 2015-05-06 (×2): qty 1

## 2015-05-06 MED ORDER — BUPIVACAINE LIPOSOME 1.3 % IJ SUSP
20.0000 mL | Freq: Once | INTRAMUSCULAR | Status: AC
  Administered 2015-05-06: 20 mL
  Filled 2015-05-06: qty 20

## 2015-05-06 MED ORDER — ROCURONIUM BROMIDE 100 MG/10ML IV SOLN
INTRAVENOUS | Status: AC
Start: 2015-05-06 — End: 2015-05-06
  Filled 2015-05-06: qty 1

## 2015-05-06 MED ORDER — CEFAZOLIN SODIUM-DEXTROSE 2-3 GM-% IV SOLR
INTRAVENOUS | Status: AC
Start: 2015-05-06 — End: 2015-05-06
  Filled 2015-05-06: qty 50

## 2015-05-06 MED ORDER — SODIUM CHLORIDE 0.9 % IV BOLUS (SEPSIS)
1000.0000 mL | Freq: Once | INTRAVENOUS | Status: AC
  Administered 2015-05-06: 1000 mL via INTRAVENOUS

## 2015-05-06 MED ORDER — HYDROMORPHONE HCL 2 MG/ML IJ SOLN
INTRAMUSCULAR | Status: AC
Start: 2015-05-06 — End: 2015-05-06
  Filled 2015-05-06: qty 1

## 2015-05-06 MED ORDER — FENTANYL CITRATE (PF) 100 MCG/2ML IJ SOLN
INTRAMUSCULAR | Status: AC
  Administered 2015-05-06: 50 ug via INTRAVENOUS
  Filled 2015-05-06: qty 2

## 2015-05-06 MED ORDER — PROPOFOL 10 MG/ML IV BOLUS
INTRAVENOUS | Status: DC | PRN
  Administered 2015-05-06: 200 mg via INTRAVENOUS

## 2015-05-06 MED ORDER — ROCURONIUM BROMIDE 100 MG/10ML IV SOLN
INTRAVENOUS | Status: DC | PRN
  Administered 2015-05-06: 40 mg via INTRAVENOUS
  Administered 2015-05-06 (×2): 10 mg via INTRAVENOUS

## 2015-05-06 MED ORDER — PRAVASTATIN SODIUM 40 MG PO TABS
40.0000 mg | ORAL_TABLET | Freq: Every day | ORAL | Status: DC
  Administered 2015-05-06 – 2015-05-07 (×2): 40 mg via ORAL
  Filled 2015-05-06 (×2): qty 1

## 2015-05-06 MED ORDER — SUGAMMADEX SODIUM 200 MG/2ML IV SOLN
INTRAVENOUS | Status: DC | PRN
  Administered 2015-05-06: 200 mg via INTRAVENOUS

## 2015-05-06 MED ORDER — SUCCINYLCHOLINE CHLORIDE 20 MG/ML IJ SOLN
INTRAMUSCULAR | Status: DC | PRN
  Administered 2015-05-06: 100 mg via INTRAVENOUS

## 2015-05-06 MED ORDER — FENTANYL CITRATE (PF) 100 MCG/2ML IJ SOLN
25.0000 ug | INTRAMUSCULAR | Status: DC | PRN
  Administered 2015-05-06: 25 ug via INTRAVENOUS
  Administered 2015-05-06: 50 ug via INTRAVENOUS
  Administered 2015-05-06: 25 ug via INTRAVENOUS

## 2015-05-06 MED ORDER — ONDANSETRON HCL 4 MG/2ML IJ SOLN
INTRAMUSCULAR | Status: AC
Start: 2015-05-06 — End: 2015-05-06
  Filled 2015-05-06: qty 2

## 2015-05-06 MED ORDER — LIDOCAINE HCL (CARDIAC) 20 MG/ML IV SOLN
INTRAVENOUS | Status: DC | PRN
  Administered 2015-05-06: 100 mg via INTRAVENOUS

## 2015-05-06 MED ORDER — HYDROMORPHONE HCL 1 MG/ML IJ SOLN
INTRAMUSCULAR | Status: DC | PRN
  Administered 2015-05-06 (×2): 1 mg via INTRAVENOUS

## 2015-05-06 MED ORDER — SODIUM CHLORIDE 0.45 % IV SOLN
INTRAVENOUS | Status: DC
  Administered 2015-05-06: 1000 mL via INTRAVENOUS
  Administered 2015-05-06 – 2015-05-07 (×2): via INTRAVENOUS

## 2015-05-06 MED ORDER — HYDROMORPHONE HCL 1 MG/ML IJ SOLN
0.5000 mg | INTRAMUSCULAR | Status: DC | PRN
  Administered 2015-05-06: 1 mg via INTRAVENOUS
  Filled 2015-05-06: qty 1

## 2015-05-06 MED ORDER — INSULIN ASPART 100 UNIT/ML ~~LOC~~ SOLN
0.0000 [IU] | Freq: Three times a day (TID) | SUBCUTANEOUS | Status: DC
  Administered 2015-05-07: 3 [IU] via SUBCUTANEOUS
  Administered 2015-05-07: 2 [IU] via SUBCUTANEOUS
  Administered 2015-05-07: 5 [IU] via SUBCUTANEOUS

## 2015-05-06 MED ORDER — PHENYLEPHRINE 40 MCG/ML (10ML) SYRINGE FOR IV PUSH (FOR BLOOD PRESSURE SUPPORT)
PREFILLED_SYRINGE | INTRAVENOUS | Status: AC
Start: 2015-05-06 — End: 2015-05-06
  Filled 2015-05-06: qty 10

## 2015-05-06 MED ORDER — ONDANSETRON HCL 4 MG/2ML IJ SOLN
4.0000 mg | INTRAMUSCULAR | Status: DC | PRN
Start: 2015-05-06 — End: 2015-05-07
  Administered 2015-05-06: 4 mg via INTRAVENOUS
  Filled 2015-05-06: qty 2

## 2015-05-06 MED ORDER — CEFAZOLIN SODIUM-DEXTROSE 2-3 GM-% IV SOLR
2.0000 g | INTRAVENOUS | Status: AC
  Administered 2015-05-06: 2 g via INTRAVENOUS

## 2015-05-06 MED ORDER — FENTANYL CITRATE (PF) 250 MCG/5ML IJ SOLN
INTRAMUSCULAR | Status: AC
  Filled 2015-05-06: qty 5

## 2015-05-06 MED ORDER — SODIUM CHLORIDE 0.9 % IJ SOLN
INTRAMUSCULAR | Status: AC
  Filled 2015-05-06: qty 20

## 2015-05-06 MED ORDER — PROPOFOL 10 MG/ML IV BOLUS
INTRAVENOUS | Status: AC
  Filled 2015-05-06: qty 20

## 2015-05-06 MED ORDER — ACETAMINOPHEN 500 MG PO TABS
1000.0000 mg | ORAL_TABLET | Freq: Four times a day (QID) | ORAL | Status: AC
  Administered 2015-05-06 – 2015-05-07 (×4): 1000 mg via ORAL
  Filled 2015-05-06 (×5): qty 2

## 2015-05-06 MED ORDER — OXYCODONE HCL 5 MG PO TABS
5.0000 mg | ORAL_TABLET | ORAL | Status: DC | PRN
  Administered 2015-05-06 – 2015-05-07 (×5): 5 mg via ORAL
  Filled 2015-05-06 (×5): qty 1

## 2015-05-06 MED ORDER — MIDAZOLAM HCL 5 MG/5ML IJ SOLN
INTRAMUSCULAR | Status: DC | PRN
  Administered 2015-05-06: 2 mg via INTRAVENOUS

## 2015-05-06 MED ORDER — HYDROMORPHONE HCL 1 MG/ML IJ SOLN
0.2500 mg | INTRAMUSCULAR | Status: DC | PRN
  Administered 2015-05-06 (×3): 0.5 mg via INTRAVENOUS

## 2015-05-06 MED ORDER — HYDROCODONE-ACETAMINOPHEN 5-325 MG PO TABS: 1.0000 | ORAL_TABLET | Freq: Four times a day (QID) | ORAL | Status: DC | PRN

## 2015-05-06 MED ORDER — RAMIPRIL 5 MG PO CAPS
5.0000 mg | ORAL_CAPSULE | Freq: Every day | ORAL | Status: DC
Start: 2015-05-06 — End: 2015-05-07
  Administered 2015-05-06 – 2015-05-07 (×2): 5 mg via ORAL
  Filled 2015-05-06 (×2): qty 1

## 2015-05-06 MED ORDER — MIDAZOLAM HCL 2 MG/2ML IJ SOLN
INTRAMUSCULAR | Status: AC
  Filled 2015-05-06: qty 2

## 2015-05-06 MED ORDER — SUGAMMADEX SODIUM 200 MG/2ML IV SOLN
INTRAVENOUS | Status: AC
  Filled 2015-05-06: qty 2

## 2015-05-06 MED ORDER — LACTATED RINGERS IV SOLN
INTRAVENOUS | Status: DC
  Administered 2015-05-06: 1000 mL via INTRAVENOUS
  Administered 2015-05-06: 16:00:00 via INTRAVENOUS

## 2015-05-06 MED ORDER — LIDOCAINE HCL (CARDIAC) 20 MG/ML IV SOLN
INTRAVENOUS | Status: AC
  Filled 2015-05-06: qty 5

## 2015-05-06 MED ORDER — LACTATED RINGERS IV SOLN: INTRAVENOUS | Status: DC

## 2015-05-06 SURGICAL SUPPLY — 54 items
CABLE HIGH FREQUENCY MONO STRZ (ELECTRODE) ×2 IMPLANT
CATH FOLEY 2WAY SLVR 18FR 30CC (CATHETERS) ×2 IMPLANT
CATH TIEMANN FOLEY 18FR 5CC (CATHETERS) ×2 IMPLANT
CHLORAPREP W/TINT 26ML (MISCELLANEOUS) ×2 IMPLANT
CLIP LIGATING HEM O LOK PURPLE (MISCELLANEOUS) IMPLANT
CLOTH BEACON ORANGE TIMEOUT ST (SAFETY) ×2 IMPLANT
CONT SPEC 4OZ CLIKSEAL STRL BL (MISCELLANEOUS) ×2 IMPLANT
COVER SURGICAL LIGHT HANDLE (MISCELLANEOUS) ×2 IMPLANT
COVER TIP SHEARS 8 DVNC (MISCELLANEOUS) ×1 IMPLANT
COVER TIP SHEARS 8MM DA VINCI (MISCELLANEOUS) ×1
CUTTER ECHEON FLEX ENDO 45 340 (ENDOMECHANICALS) ×2 IMPLANT
DECANTER SPIKE VIAL GLASS SM (MISCELLANEOUS) IMPLANT
DRSG TEGADERM 4X4.75 (GAUZE/BANDAGES/DRESSINGS) ×4 IMPLANT
DRSG TEGADERM 6X8 (GAUZE/BANDAGES/DRESSINGS) ×4 IMPLANT
ELECT REM PT RETURN 9FT ADLT (ELECTROSURGICAL) ×2
ELECTRODE REM PT RTRN 9FT ADLT (ELECTROSURGICAL) ×1 IMPLANT
GAUZE SPONGE 2X2 8PLY STRL LF (GAUZE/BANDAGES/DRESSINGS) IMPLANT
GLOVE BIO SURGEON STRL SZ 6.5 (GLOVE) ×2 IMPLANT
GLOVE BIOGEL M STRL SZ7.5 (GLOVE) ×4 IMPLANT
GLOVE BIOGEL PI IND STRL 7.5 (GLOVE) ×1 IMPLANT
GLOVE BIOGEL PI INDICATOR 7.5 (GLOVE) ×1
GOWN STRL REUS W/TWL LRG LVL3 (GOWN DISPOSABLE) ×4 IMPLANT
GOWN STRL REUS W/TWL LRG LVL4 (GOWN DISPOSABLE) ×6 IMPLANT
HOLDER FOLEY CATH W/STRAP (MISCELLANEOUS) ×2 IMPLANT
IV LACTATED RINGERS 1000ML (IV SOLUTION) IMPLANT
KIT ACCESSORY DA VINCI DISP (KITS) ×1
KIT ACCESSORY DVNC DISP (KITS) ×1 IMPLANT
KIT PROCEDURE DA VINCI SI (MISCELLANEOUS) ×1
KIT PROCEDURE DVNC SI (MISCELLANEOUS) ×1 IMPLANT
LIQUID BAND (GAUZE/BANDAGES/DRESSINGS) ×2 IMPLANT
NEEDLE INSUFFLATION 14GA 120MM (NEEDLE) ×2 IMPLANT
NEEDLE SPNL 22GX7 QUINCKE BK (NEEDLE) ×2 IMPLANT
PACK ROBOT UROLOGY CUSTOM (CUSTOM PROCEDURE TRAY) ×2 IMPLANT
PAD POSITIONING PINK XL (MISCELLANEOUS) ×2 IMPLANT
RELOAD GREEN ECHELON 45 (STAPLE) ×2 IMPLANT
SET TUBE IRRIG SUCTION NO TIP (IRRIGATION / IRRIGATOR) ×2 IMPLANT
SHEET LAVH (DRAPES) ×2 IMPLANT
SOLUTION ELECTROLUBE (MISCELLANEOUS) ×2 IMPLANT
SPONGE GAUZE 2X2 STER 10/PKG (GAUZE/BANDAGES/DRESSINGS)
SPONGE LAP 4X18 X RAY DECT (DISPOSABLE) ×2 IMPLANT
SUT ETHILON 3 0 PS 1 (SUTURE) ×2 IMPLANT
SUT MNCRL AB 4-0 PS2 18 (SUTURE) ×4 IMPLANT
SUT PDS AB 1 CT1 27 (SUTURE) ×4 IMPLANT
SUT PROLENE 3 0 SH1 36 (SUTURE) ×2 IMPLANT
SUT VIC AB 2-0 SH 27 (SUTURE) ×1
SUT VIC AB 2-0 SH 27X BRD (SUTURE) ×1 IMPLANT
SUT VICRYL 0 UR6 27IN ABS (SUTURE) ×2 IMPLANT
SUT VLOC BARB 180 ABS3/0GR12 (SUTURE) ×6
SUTURE VLOC BRB 180 ABS3/0GR12 (SUTURE) ×3 IMPLANT
SYR 27GX1/2 1ML LL SAFETY (SYRINGE) ×2 IMPLANT
TOWEL OR 17X26 10 PK STRL BLUE (TOWEL DISPOSABLE) ×2 IMPLANT
TOWEL OR NON WOVEN STRL DISP B (DISPOSABLE) ×2 IMPLANT
TROCAR 12M 150ML BLUNT (TROCAR) ×2 IMPLANT
WATER STERILE IRR 1500ML POUR (IV SOLUTION) IMPLANT

## 2015-05-06 NOTE — Anesthesia Preprocedure Evaluation (Signed)
Anesthesia Evaluation  Patient identified by MRN, date of birth, ID band Patient awake    Reviewed: Allergy & Precautions, NPO status , Patient's Chart, lab work & pertinent test results  Airway Mallampati: II  TM Distance: >3 FB Neck ROM: Full    Dental  (+) Dental Advisory Given, Teeth Intact   Pulmonary neg pulmonary ROS,    Pulmonary exam normal breath sounds clear to auscultation       Cardiovascular hypertension, Pt. on medications Normal cardiovascular exam Rhythm:Regular Rate:Normal     Neuro/Psych Neck problems with numbness and tingling of arms negative neurological ROS     GI/Hepatic Neg liver ROS, GERD  Medicated and Controlled,Fatty liver disease   Endo/Other  diabetes, Type 2, Oral Hypoglycemic Agents  Renal/GU negative Renal ROS     Musculoskeletal  (+) Arthritis ,   Abdominal Normal abdominal exam  (+)   Peds  Hematology negative hematology ROS (+)   Anesthesia Other Findings   Reproductive/Obstetrics                             Anesthesia Physical Anesthesia Plan  ASA: III  Anesthesia Plan: General   Post-op Pain Management:    Induction: Intravenous  Airway Management Planned: Oral ETT  Additional Equipment:   Intra-op Plan:   Post-operative Plan: Extubation in OR  Informed Consent:   Plan Discussed with: Surgeon  Anesthesia Plan Comments:         Anesthesia Quick Evaluation

## 2015-05-06 NOTE — Transfer of Care (Signed)
Immediate Anesthesia Transfer of Care Note  Patient: Guy [name]  Procedure(s) Performed: Procedure(s): ROBOTIC ASSISTED LAPAROSCOPIC RADICAL PROSTATECTOMY (N/A) LYMPH NODE DISSECTION (N/A)  Patient Location: PACU  Anesthesia Type:General  Level of Consciousness: sedated  Airway & Oxygen Therapy: Patient Spontanous Breathing and Patient connected to face mask oxygen  Post-op Assessment: Report given to RN and Post -op Vital signs reviewed and stable  Post vital signs: Reviewed and stable  Last Vitals:  Filed Vitals:   05/06/15 1011  BP: 135/95  Pulse: 109  Temp: 36.6 C  Resp: 18    Complications: No apparent anesthesia complications

## 2015-05-06 NOTE — Anesthesia Procedure Notes (Signed)
Procedure Name: Intubation Date/Time: 05/06/2015 12:30 PM Performed by: Lind Covert Pre-anesthesia Checklist: Patient identified, Emergency Drugs available, Suction available, Patient being monitored and Timeout performed Patient Re-evaluated:Patient Re-evaluated prior to inductionOxygen Delivery Method: Circle system utilized Preoxygenation: Pre-oxygenation with 100% oxygen Intubation Type: IV induction Ventilation: Mask ventilation without difficulty Laryngoscope Size: Mac and 4 Grade View: Grade I Tube type: Oral Tube size: 8.0 mm Number of attempts: 1 Airway Equipment and Method: Stylet Placement Confirmation: ETT inserted through vocal cords under direct vision,  positive ETCO2 and breath sounds checked- equal and bilateral Secured at: 22 cm Tube secured with: Tape Dental Injury: Teeth and Oropharynx as per pre-operative assessment

## 2015-05-06 NOTE — H&P (Signed)
Guy [name] is an 69 y.o. male.    Chief Complaint: Pre-OP Robotic Prostatectomy  HPI:   1 - Moderate Risk Prostate Cancer - No FHX prostate cancer. Long h/o rising PSA with prior negative BX 2014 x 2 and favorable prostate MRI 2015. Subsequent operative "saturation" biopsy 02/2015 with 30 cores including dedicated transperineal anterior cores with 7/30 cores positive with Gleason 3+4=7 up to 30% of cores incluidng: RLM, RMB, LMB, Lt Anterior, Rt Anterior. PSA at time 7.13, TRUS 35.67mL, no median lobe.   PMH sig for well-controlled DM2 (A1C 6.5's), HTN, HLD, Rt hip replacement, basal cell CA s/p Moh's  Today Dolph is seen to proceed with prostatectomy. NO interval fevers.    Past Medical History  Diagnosis Date  . GERD (gastroesophageal reflux disease)   . Arthritis   . Elevated PSA   . History of basal cell carcinoma excision     1990's--  moh's surg. of nose  . History of hiatal hernia   . Wears contact lenses   . Prostate cancer ([place]) 2016  . Diabetes mellitus type II     oral meds  . Anxiety     not clinical  . Neck complaint     "stiffness, catches"-denies numbness, tingling of arms  . Cataract     Past Surgical History  Procedure Laterality Date  . Total hip arthroplasty Right 08/ 2000  . Esophagogastroduodenoscopy  07/29/05    [facility]  . Prostate biopsy  2014  . Tonsillectomy  as a child  . Colonoscopy  06-01-2011  . Mohs surgery  1990's    nose  . Prostate biopsy N/A 03/06/2015    Procedure: SATURATION BIOPSY TRANSRECTAL ULTRASONIC PROSTATE (TUBP);  Surgeon: Alexis Frock, MD;  Location: [facility];  Service: Urology;  Laterality: N/A;    Family History  Problem Relation Age of Onset  . COPD Mother     smoker  . Cancer Mother     unknown primary  . Alzheimer's disease Father   . Hypertension Father   . Diabetes Father   . Obesity Brother   . Hypertension Brother   . Diabetes Brother   . Heart disease Brother     heart valve  .  Diabetes Brother   . Prostate cancer Neg Hx   . Colon cancer Neg Hx    Social History:  reports that he has never smoked. He has never used smokeless tobacco. He reports that he drinks alcohol. He reports that he does not use illicit drugs.  Allergies: No Known Allergies  No prescriptions prior to admission    No results found for this or any previous visit (from the past 48 hour(s)). No results found.  Review of Systems  Constitutional: Negative.  Negative for fever and chills.  HENT: Negative.   Eyes: Negative.   Respiratory: Negative.   Cardiovascular: Negative.   Gastrointestinal: Negative.   Genitourinary: Negative.   Musculoskeletal: Negative.   Skin: Negative.   Neurological: Negative.   Endo/Heme/Allergies: Negative.   Psychiatric/Behavioral: Negative.     There were no vitals taken for this visit. Physical Exam  Constitutional: He appears well-developed.  HENT:  Head: Normocephalic.  Eyes: Pupils are equal, round, and reactive to light.  Neck: Normal range of motion.  Cardiovascular: Normal rate.   Respiratory: Effort normal.  GI: Soft.  Genitourinary:  No CVAT  Musculoskeletal: Normal range of motion.  Neurological: He is alert.  Skin: Skin is warm.  Psychiatric: He has a  normal mood and affect. His behavior is normal. Judgment and thought content normal.     Assessment/Plan  1 - Moderate Risk Prostate Cancer -   We rediscussed prostatectomy and specifically robotic prostatectomy with bilateral pelvic lymphadenectomy being the technique that I most commonly perform. I showed the patient on their abdomen the approximately 6 small incision (trocar) sites as well as presumed extraction sites with robotic approach as well as possible open incision sites should open conversion be necessary. We rediscussed peri-operative risks including bleeding, infection, deep vein thrombosis, pulmonary embolism, compartment syndrome, nuropathy / neuropraxia, heart attack,  stroke, death, as well as long-term risks such as non-cure / need for additional therapy. We specifically readdressed that the procedure would compromise urinary control leading to stress incontinence which typically resolves with time and pelvic rehabilitation (Kegel's, etc..), but can sometimes be permanent and require additional therapy including surgery. We also specifically readdressed sexual sequellae including significant erectile dysfunction which typically partially resolves with time but can also be permanent and require additional therapy including surgery.   We rediscussed the typical hospital course including usual 1-2 night hospitalization, discharge with foley catheter in place usually for 1-2 weeks before voiding trial as well as usually 2 week recovery until able to perform most non-strenuous activity and 6 weeks until able to return to most jobs and more strenuous activity such as exercise.  Pt voiced understanding and desire to proceed today as planned.   Dejai Schubach 05/06/2015, 5:40 AM

## 2015-05-06 NOTE — Discharge Instructions (Signed)

## 2015-05-06 NOTE — Anesthesia Postprocedure Evaluation (Signed)
  Anesthesia Post-op Note  Patient: Guy [name]  Procedure(s) Performed: Procedure(s) (LRB): ROBOTIC ASSISTED LAPAROSCOPIC RADICAL PROSTATECTOMY (N/A) LYMPH NODE DISSECTION (N/A)  Patient Location: PACU  Anesthesia Type: General  Level of Consciousness: awake and alert   Airway and Oxygen Therapy: Patient Spontanous Breathing  Post-op Pain: mild  Post-op Assessment: Post-op Vital signs reviewed, Patient's Cardiovascular Status Stable, Respiratory Function Stable, Patent Airway and No signs of Nausea or vomiting  Last Vitals:  Filed Vitals:   05/06/15 1724  BP: 159/82  Pulse:   Temp: 37.3 C  Resp: 16    Post-op Vital Signs: stable   Complications: No apparent anesthesia complications

## 2015-05-06 NOTE — Brief Op Note (Signed)
05/06/2015  3:56 PM  PATIENT:  Guy [name]  69 y.o. male  PRE-OPERATIVE DIAGNOSIS:  MODERATE RISK PROSTATE CANCER  POST-OPERATIVE DIAGNOSIS:  MODERATE RISK PROSTATE CANCER  PROCEDURE:  Procedure(s): ROBOTIC ASSISTED LAPAROSCOPIC RADICAL PROSTATECTOMY (N/A) LYMPH NODE DISSECTION (N/A)  SURGEON:  Surgeon(s) and Role:    * Alexis Frock, MD - Primary  PHYSICIAN ASSISTANT:   ASSISTANTS: Clemetine Marker PA   ANESTHESIA:   local and general  EBL:  Total I/O In: 1000 [I.V.:1000] Out: 100 [Blood:100]  BLOOD ADMINISTERED:none  DRAINS: 1 - foley to gravity, 2 - JP to bulb   LOCAL MEDICATIONS USED:  MARCAINE     SPECIMEN:  Source of Specimen:  1 - bilateral pelvic lymphy nodes (Rt ext iliac sentinal), 2- periprostatic fat, 3 - prostatectomy  DISPOSITION OF SPECIMEN:  PATHOLOGY  COUNTS:  YES  TOURNIQUET:  * No tourniquets in log *  DICTATION: .Other Dictation: Dictation Number [id number]  PLAN OF CARE: Admit to inpatient   PATIENT DISPOSITION:  PACU - hemodynamically stable.   Delay start of Pharmacological VTE agent (>24hrs) due to surgical blood loss or risk of bleeding: yes

## 2015-05-07 ENCOUNTER — Encounter (HOSPITAL_COMMUNITY): Payer: Self-pay | Admitting: Urology

## 2015-05-07 LAB — GLUCOSE, CAPILLARY
GLUCOSE-CAPILLARY: 207 mg/dL — AB (ref 65–99)
GLUCOSE-CAPILLARY: 192 mg/dL — AB (ref 65–99)
Glucose-Capillary: 137 mg/dL — ABNORMAL HIGH (ref 65–99)

## 2015-05-07 LAB — BASIC METABOLIC PANEL
ANION GAP: 7 (ref 5–15)
BUN: 16 mg/dL (ref 6–20)
CALCIUM: 8.2 mg/dL — AB (ref 8.9–10.3)
CO2: 28 mmol/L (ref 22–32)
Chloride: 100 mmol/L — ABNORMAL LOW (ref 101–111)
Creatinine, Ser: 1.47 mg/dL — ABNORMAL HIGH (ref 0.61–1.24)
GFR calc Af Amer: 54 mL/min — ABNORMAL LOW (ref >60)
GFR calc non Af Amer: 47 mL/min — ABNORMAL LOW (ref >60)
GLUCOSE: 166 mg/dL — AB (ref 65–99)
Potassium: 4 mmol/L (ref 3.5–5.1)
Sodium: 135 mmol/L (ref 135–145)

## 2015-05-07 LAB — CREATININE, FLUID (PLEURAL, PERITONEAL, JP DRAINAGE): CREAT FL: 1.5 mg/dL

## 2015-05-07 LAB — HEMOGLOBIN AND HEMATOCRIT, BLOOD
HCT: 35.7 % — ABNORMAL LOW (ref 39.0–52.0)
Hemoglobin: 12.1 g/dL — ABNORMAL LOW (ref 13.0–17.0)

## 2015-05-07 MED ORDER — SENNOSIDES-DOCUSATE SODIUM 8.6-50 MG PO TABS: 1.0000 | ORAL_TABLET | Freq: Two times a day (BID) | ORAL | Status: DC

## 2015-05-07 NOTE — Progress Notes (Signed)
D/c w/ dtr ambulating per request d/c teaching done leg bag in place

## 2015-05-07 NOTE — Progress Notes (Signed)
1 Day Post-Op  Subjective:  1 - Moderate Risk Prostate Cancer - s/p robotic prostatectomy with bilateral sentinal + template lymphadenectomy 05/06/2015. Path pending.   Today Luie c/o mild abd soreness that is not limitting. Ambulated yesterday and this AM. JP output minimal.   Objective: Vital signs in last 24 hours: Temp:  [97.8 F (36.6 C)-99.3 F (37.4 C)] 99 F (37.2 C) (11/17 0614) Pulse Rate:  [87-109] 87 (11/17 0614) Resp:  [11-18] 16 (11/17 0614) BP: (106-163)/(60-95) 106/60 mmHg (11/17 0614) SpO2:  [94 %-100 %] 97 % (11/17 0614) Weight:  [76.828 kg (169 lb 6 oz)] 76.828 kg (169 lb 6 oz) (11/16 1023) Last BM Date: 05/05/15  Intake/Output from previous day: 11/16 0701 - 11/17 0700 In: 3887.1 [P.O.:360; I.V.:3527.1] Out: 2160 [Urine:1930; Drains:130; Blood:100] Intake/Output this shift:    General appearance: alert, cooperative, appears stated age and daughter at bedside Eyes: negative Nose: Nares normal. Septum midline. Mucosa normal. No drainage or sinus tenderness. Throat: lips, mucosa, and tongue normal; teeth and gums normal Neck: supple, symmetrical, trachea midline Back: symmetric, no curvature. ROM normal. No CVA tenderness. Resp: non-labored Cardio: Nl rate GI: soft, non-tender; bowel sounds normal; no masses,  no organomegaly Male genitalia: normal, foley c/d/i with yellow urine.  Extremities: extremities normal, atraumatic, no cyanosis or edema Pulses: 2+ and symmetric Skin: Skin color, texture, turgor normal. No rashes or lesions Lymph nodes: Cervical, supraclavicular, and axillary nodes normal. Neurologic: Grossly normal Incision/Wound: Recent port sites / extraction sites c/d/i. JP with scant serosanguinous fluid.   Lab Results:   Recent Labs  05/06/15 1630 05/07/15 0445  HGB 12.8* 12.1*  HCT 37.4* 35.7*   BMET  Recent Labs  05/07/15 0445  NA 135  K 4.0  CL 100*  CO2 28  GLUCOSE 166*  BUN 16  CREATININE 1.47*  CALCIUM 8.2*    PT/INR No results for input(s): LABPROT, INR in the last 72 hours. ABG No results for input(s): PHART, HCO3 in the last 72 hours.  Invalid input(s): PCO2, PO2  Studies/Results: No results found.  Anti-infectives: Anti-infectives    Start     Dose/Rate Route Frequency Ordered Stop   05/06/15 1018  ceFAZolin (ANCEF) IVPB 2 g/50 mL premix     2 g 100 mL/hr over 30 Minutes Intravenous 30 min pre-op 05/06/15 1018 05/06/15 1237   05/06/15 0000  sulfamethoxazole-trimethoprim (BACTRIM DS,SEPTRA DS) 800-160 MG tablet     1 tablet Oral 2 times daily 05/06/15 1559        Assessment/Plan:  1 - Moderate Risk Prostate Cancer - doing well POD 1. Saline lock, advance diet, Check JP Cr. Likely DC today afternoon v. Tomorrow AM.   Tresa Moore, Eisley Barber 05/07/2015

## 2015-05-07 NOTE — Progress Notes (Signed)
Utilization review completed.  

## 2015-05-07 NOTE — Discharge Summary (Signed)
Physician Discharge Summary  Patient ID: Guy [name] MRN: [id number] DOB/AGE: 1946/01/31 69 y.o.  Admit date: 05/06/2015 Discharge date: 05/07/2015  Admission Diagnoses: Prostate Cancer  Discharge Diagnoses:  Active Problems:   Prostate cancer [facility])   Discharged Condition: good  Hospital Course:   1 - Moderate Risk Prostate Cancer - s/p robotic prostatectomy with bilateral sentinal + template lymphadenectomy 05/06/2015. Admitted to 4th floor Urology service post-op. By POD 1, the day of discharge, he is ambulatory, pain controlled on PO meds, tollerating PO intake, and felt to be adequate for discharge. JP Cr same as serum and removed prior to DC. Final Path pending at discharge.   Today Guy [name] c/o mild abd soreness that is not limitting. Ambulated yesterday and this AM. JP output minimal.   Consults: None  Significant Diagnostic Studies: labs: Hgb >10 at discharge.  Treatments: surgery:  robotic prostatectomy with bilateral sentinal + template lymphadenectomy 05/06/2015  Discharge Exam: Blood pressure 103/67, pulse 77, temperature 98 F (36.7 C), temperature source Oral, resp. rate 16, height 5' 6.5" (1.689 m), weight 76.828 kg (169 lb 6 oz), SpO2 100 %. General appearance: alert, cooperative, appears stated age and family at bedside Eyes: negative Nose: Nares normal. Septum midline. Mucosa normal. No drainage or sinus tenderness. Throat: lips, mucosa, and tongue normal; teeth and gums normal Neck: no adenopathy, no carotid bruit, no JVD, supple, symmetrical, trachea midline and thyroid not enlarged, symmetric, no tenderness/mass/nodules Back: symmetric, no curvature. ROM normal. No CVA tenderness. Resp: non-labored on room air Cardio: Nl rate GI: soft, non-tender; bowel sounds normal; no masses,  no organomegaly Male genitalia: normal, foley c/d/i with clear yellow urine.  Extremities: extremities normal, atraumatic, no cyanosis or edema Pulses: 2+ and symmetric Skin:  Skin color, texture, turgor normal. No rashes or lesions Lymph nodes: Cervical, supraclavicular, and axillary nodes normal. Neurologic: Grossly normal Incision/Wound: Recent port and extraction sites c/d/i. JP removed (output minimal serosanguinous, non-foul) and dry dressing placed.  Disposition: 01-Home or Self Care     Medication List    STOP taking these medications        aspirin 81 MG tablet     B-complex with vitamin C tablet     celecoxib 200 MG capsule  Commonly known as:  CELEBREX     multivitamin tablet      TAKE these medications        HYDROcodone-acetaminophen 5-325 MG tablet  Commonly known as:  NORCO  Take 1-2 tablets by mouth every 6 (six) hours as needed.     metFORMIN 1000 MG tablet  Commonly known as:  GLUCOPHAGE  TAKE 1 TABLET BY MOUTH TWICE A DAY WITH A MEAL     pravastatin 40 MG tablet  Commonly known as:  PRAVACHOL  TAKE 1 TABLET BY MOUTH EVERY EVENING AS DIRECTED.     PRILOSEC 20 MG capsule  Generic drug:  omeprazole  Take 20 mg by mouth every morning.     ramipril 5 MG capsule  Commonly known as:  ALTACE  TAKE ONE CAPSULE BY MOUTH DAILY     senna-docusate 8.6-50 MG tablet  Commonly known as:  Senokot-S  Take 1 tablet by mouth 2 (two) times daily. While taking pain meds to prevent constipation     sulfamethoxazole-trimethoprim 800-160 MG tablet  Commonly known as:  BACTRIM DS,SEPTRA DS  Take 1 tablet by mouth 2 (two) times daily. Start the day prior to foley removal appointment           Follow-up Information  Follow up with Alexis Frock, MD On 05/25/2015.   Specialty:  Urology   Why:  at 1pm for MD visit and office catheter removal   Contact information:   [place] [id number] [phone]       Signed: Alexis Frock 05/07/2015, 4:24 PM

## 2015-05-07 NOTE — Op Note (Signed)
NAMEAARJAV, [name] NO.:  [id number]  MEDICAL RECORD NO.:  [id number]  LOCATION:  [id number]                         FACILITY:  [facility]  PHYSICIAN:  Guy Frock, MD     DATE OF BIRTH:  01-29-1946  DATE OF PROCEDURE: 05/06/2015                              OPERATIVE REPORT   DIAGNOSIS:  Moderate-risk prostate cancer.  PROCEDURES: 1. Robotic-assisted laparoscopic prostatectomy. 2. Bilateral pelvic lymphadenectomy. 3. Injection of indocyanine green dye for sentinel lymph angiography.  ASSISTANT:  Clemetine Marker, PA.  ESTIMATED BLOOD LOSS:  100 mL.  DRAINS: 1. Jackson-Pratt drain to bulb suction. 2. Foley catheter to straight drain.  SPECIMENS: 1. Radical prostatectomy. 2. Periprostatic fat. 3. Right external iliac lymph node, sentinel. 4. Right obturator lymph nodes. 5. Left external iliac lymph nodes. 6. Left obturator lymph nodes.  FINDINGS: 1. Three hyperfluorescent lymph nodes in the confines of right     external iliac lymph node packet, this was labeled as such. 2. Adherent posterior plane consistent likely with chronic     inflammation from prior biopsies, this resulted in very close     dissection to the rectum on the right side, but no evidence of     violation noted with visual exam times several.  INDICATION:  Guy [name] is a very pleasant 69 year old gentleman with long history of elevated and slowly rising PSA.  He is status post prostate biopsy, traditional 12 core approach x2 as well as prostatic MRI, all of which had been favorable; however, his PSA continued to rise.  Options were discussed including operative saturation biopsy with anterior sampling and he had this done, this revealed multifocal moderate-risk prostate cancer including significant tumor burden in his anterior prostate, this being an area that is traditionally poorly simple on additional prostate biopsy.  Options were discussed for definitive management including  surveillance protocol versus surgical extirpation with and without minimally invasive assistance versus ablative therapies and he adamantly wished to proceed with prostatectomy with minimally invasive assistance.  Informed consent was obtained and placed in the medical record.  PROCEDURE IN DETAIL:  The patient being Guy [name], was verified. Procedure being robotic prostatectomy with lymphadenectomy was confirmed.  Procedure was carried out.  Time-out was performed. Intravenous antibiotics were administered.  General endotracheal anesthesia was introduced.  The patient was placed into a low lithotomy position tucking his arms, set aside by placing on a pink non-stick pad and further fashioned on the operative table using 3-inch tape over foam padding across his chest.  A test of steep Trendelenburg positioning was performed and he was found to be suitably positioned.  Sterile field was created by prepping and draping the infra-xiphoid abdomen using chlorhexidine gluconate in his penis, perineum and proximal thighs using iodine.  Foley catheter was placed per urethra to straight drain.  Next, a high-flow, low-pressure pneumoperitoneum was obtained using Veress technique in the infraumbilical midline having passed the aspiration and drop test.  Next, a 12-mm robotic camera port was placed into this location and laparoscopic examination of the peritoneal cavity revealed some mild adhesions of the descending colon sigmoid area to the left pelvic wall without any signs of active inflammation.  Additional ports were then placed as follows:  Right paramedian 8-mm robotic port, right far lateral 12-mm assistant port, right superior paramedian 5-mm suction port, left paramedian 8-mm robotic port, left far lateral 8-mm robotic port.  Robot was docked and passed through the electronic checks. Initial attention was directed at development of space of Retzius. Incision was made lateral to the left  medial umbilical ligament from the midline towards the area of the vas deferens near the internal ring. Vas deferens was ligated, and used a bucket-handle for gentle medial traction and the left lateral aspect of the bladder was swept away from the pelvic sidewall towards the area of the base of the prostate.  This was carried posteriorly.  Such that the ureter was identified, which demarcated the posterior portion of this dissection.  A mirror-imaged dissection was performed on the right side dissecting the right bladder wall away from the pelvic sidewall towards the area of the base of the prostate, also purposefully transecting the right vas deferens during this dissection.  Anterior attachments were taken down with cautery scissors towards the anterior base of the prostate.  The prostate bladder neck area was defatted in the anterior plane, this fatty tissue was set aside and labeled as periprostatic fat.  Next, 0.2 mL of indocyanine green dye was injected into each lobe of the prostate using a percutaneously placed 18-gauge needle with robotic guidance. Intervening suctioning was performed to prevent dye spillage, which was minimized.  The endopelvic fascia was then carefully swept away from the lateral aspect of the prostate in a base to apex orientation, which exposed the dorsal venous complex, which was carefully controlled using vascular load stapler.  Taking great care to keep as much anterior tissue on the prostate as possible giving his known anterior tumor within approximately 10 minutes post dye injection, and the pelvis was inspected under near-infrared fluorescent light.  Sentinel lymphangiography with indocyanine green dye revealed several lymphatic channels coursing over the bladder toward the area of the pelvic lymph node fields within confines of the right external iliac packet, there were several hyperfluorescent lymph nodes.  As such, lymphangiectomy was performed  first with the right external iliac lymph node, agreed with confines being right external iliac artery, vein, pelvic sidewall and ureter.  This fibrofatty tissue was set aside for permanent pathology, labeled right external iliac lymph node, sentinel. Lymphostasis was achieved with cold clips.  Next, the left obturator group was dissected free with confines being right external iliac vein, pelvic sidewall, and obturator nerve.  Lymphostasis was achieved with cold clips, set aside, labeled right after lymph nodes.  The obturator nerve was inspected following these maneuvers and found to be uninjured. Similarly, a mirror-imaged lymphadenectomy was performed on the left side of left external iliac and left obturator lymph node groups respectively.  There were no freshened lymph nodes within these packets or anywhere in the left pelvic lymph node fields.  Attention was then directed at bladder neck dissection.  The bladder neck was identified in the anterior plane by removing the Foley catheter back and forth and dissection proceeded in the anterior-posterior direction separating the bladder neck from the base of the prostate keeping what appeared to be circular direction, bladder neck fibers with both the prostatic and bladder neck size of the dissection and avoiding excessive bladder neck caliber.  There was no significant median lobe noted.  Posterior dissection was performed by incising approximately 7 mm inferior- posterior to the posterior lip of the prostate and  entering the plane of Denonvilliers.  The posterior plane from here towards the apex was quite desmoplastic as anticipated given the patient's prior biopsy history, but there was no obvious active infection within the plane.  The bilateral vas deferens were circumferentially dissected for distance approximately 4 cm, ligated, placed on gentle superior traction. Bilateral seminal vesicles were dissected to their tip and also  placed on gentle superior traction.  Dissection proceeded within this plane towards the apex of the prostate.  This exposed the vascular pedicles. First on the right side, the pedicles were controlled using sequential clipping technique with the Hem-o-Lok clips performing purposely wide dissection and only partial nerve-sparing given the patient's moderate disease in bilateral base and apical positivity.  The posterolateral aspect especially on the right side was quite thick and desmoplastic and exquisite care was taken to dissect this posterior tissue and right pedicle tissue away from the prostate to avoid rectal injury, which did not occur.  Similarly, left prostatic pedicle was controlled in a base- to-apex orientation.  Apical dissection was performed in the anterior plane and placed in the prostate on gentle superior traction identifying the numerous urethra, which was coldly transected in its midportion keeping an adequate urethral stump.  This completely freed up the prostatectomy specimen, which was placed through an EndoCatch bag for later retrieval.  Next, digital rectal exam was performed using indicator glove and as anticipated, the dissection was quite thin on the right side, but without any rectal mucosal violation, this corroborated by no visualization of the indicator glove laparoscopically and no evidence of blood on the glove.  Given the thinness; however, the right side of the dissection, the rectal tissue was imbricated at its fibromuscular layer over the area of thinned dissection using running 3- 0 Prolene suture.  Another rectal exam was performed, which corroborated excellent rectal muscular apposition over the mucosa and the rectum easily accommodated 2 fingers.  Next, posterior dissection was performed using interrupted 3-0 V-Loc suture reapproximating the posterior urethral plate to the posterior bladder neck bringing the structures in a tension-free  apposition.  Mucosa-to-mucosa anastomosis was performed using double-armed V-Loc suture from the 6 o'clock to 12 o'clock position.  The 12 o'clock position of this was anchored to the puboprostatic ligament, this performing anterior reconstruction as well. A new Foley catheter was then placed per urethra, which irrigated quantitatively ruling out any bladder leak.  At this point, all sponge and needle counts were correct.  Hemostasis appeared excellent, felt to be achieve the goals of the surgery today.  A closed suction drain was brought through the previous left lateral most robotic port site into the area of the peritoneal cavity.  Robot was undocked.  The previous right lateral most assistant port site was closed at the level of the fascia using Carter-Thomason suture passer under laparoscopic vision. The specimen was freed by extending the previous camera port site inferiorly for total distance of approximately 3 cm removing the prostatectomy specimen and setting it aside for permanent pathology. This extraction site was closed at the level of the fascia using figure- of-eight PDS x3 followed by Scarpa's with 2-0 Vicryl.  All incision sites were infiltrated with dilute lyophilized Marcaine and closed to level of the skin using subcuticular Monocryl followed by Dermabond. Procedure was then terminated.  The patient tolerated the procedure well.  There were no immediate periprocedural complications.  The patient was taken to the postanesthesia care unit in stable condition.  ______________________________ Guy Frock, MD     TM/MEDQ  D:  05/06/2015  T:  05/07/2015  Job:  [id number]

## 2015-05-17 LAB — HM DIABETES EYE EXAM

## 2015-05-25 DIAGNOSIS — N393 Stress incontinence (female) (male): Secondary | ICD-10-CM | POA: Diagnosis not present

## 2015-05-25 DIAGNOSIS — C61 Malignant neoplasm of prostate: Secondary | ICD-10-CM | POA: Diagnosis not present

## 2015-05-25 DIAGNOSIS — N5201 Erectile dysfunction due to arterial insufficiency: Secondary | ICD-10-CM | POA: Diagnosis not present

## 2015-06-04 DIAGNOSIS — N393 Stress incontinence (female) (male): Secondary | ICD-10-CM | POA: Diagnosis not present

## 2015-06-04 DIAGNOSIS — M6281 Muscle weakness (generalized): Secondary | ICD-10-CM | POA: Diagnosis not present

## 2015-06-04 DIAGNOSIS — R278 Other lack of coordination: Secondary | ICD-10-CM | POA: Diagnosis not present

## 2015-06-18 DIAGNOSIS — N393 Stress incontinence (female) (male): Secondary | ICD-10-CM | POA: Diagnosis not present

## 2015-06-18 DIAGNOSIS — R278 Other lack of coordination: Secondary | ICD-10-CM | POA: Diagnosis not present

## 2015-06-18 DIAGNOSIS — M6281 Muscle weakness (generalized): Secondary | ICD-10-CM | POA: Diagnosis not present

## 2015-06-23 ENCOUNTER — Other Ambulatory Visit: Payer: Self-pay | Admitting: *Deleted

## 2015-06-23 NOTE — Telephone Encounter (Signed)
Faxed refill request. Pharmacy asking for new Rx. Last Filled:   10/2014.  Medication was not on current meds list.  Please advise.

## 2015-06-24 MED ORDER — CELECOXIB 200 MG PO CAPS: 200.0000 mg | ORAL_CAPSULE | Freq: Every day | ORAL | Status: DC | PRN

## 2015-06-24 NOTE — Telephone Encounter (Signed)
Sent. Thanks.   

## 2015-06-26 ENCOUNTER — Telehealth: Payer: Self-pay

## 2015-06-26 DIAGNOSIS — M1990 Unspecified osteoarthritis, unspecified site: Secondary | ICD-10-CM

## 2015-06-26 NOTE — Telephone Encounter (Signed)
V/M left requesting status of celebrex prior auth to [place]. Request cb. Pt is going out of town and will be out of med while out of town and request cb ASAP.

## 2015-06-29 NOTE — Telephone Encounter (Signed)
Lugene was out sick on Friday and no PA's were able to be done.  Will work on it as soon as it is feasible.

## 2015-07-01 DIAGNOSIS — M1990 Unspecified osteoarthritis, unspecified site: Secondary | ICD-10-CM | POA: Insufficient documentation

## 2015-07-01 NOTE — Telephone Encounter (Signed)
I am trying to complete PA--but I did not see a Dx code or problem that would be directly related to prescribing Celebrex--please advise Dx so that I can complete form  Thanks

## 2015-07-01 NOTE — Telephone Encounter (Signed)
OA.  [M19.90] Thanks.

## 2015-07-02 NOTE — Telephone Encounter (Signed)
Per [email or web address]--Celebrex has been approved--pt is aware as instructed

## 2015-07-07 DIAGNOSIS — C61 Malignant neoplasm of prostate: Secondary | ICD-10-CM | POA: Diagnosis not present

## 2015-07-10 ENCOUNTER — Other Ambulatory Visit: Payer: Self-pay | Admitting: Family Medicine

## 2015-07-10 DIAGNOSIS — E119 Type 2 diabetes mellitus without complications: Secondary | ICD-10-CM

## 2015-07-13 ENCOUNTER — Other Ambulatory Visit (INDEPENDENT_AMBULATORY_CARE_PROVIDER_SITE_OTHER): Payer: Medicare Other

## 2015-07-13 DIAGNOSIS — M6281 Muscle weakness (generalized): Secondary | ICD-10-CM | POA: Diagnosis not present

## 2015-07-13 DIAGNOSIS — N393 Stress incontinence (female) (male): Secondary | ICD-10-CM | POA: Diagnosis not present

## 2015-07-13 DIAGNOSIS — Z0000 Encounter for general adult medical examination without abnormal findings: Secondary | ICD-10-CM | POA: Diagnosis not present

## 2015-07-13 DIAGNOSIS — E119 Type 2 diabetes mellitus without complications: Secondary | ICD-10-CM | POA: Diagnosis not present

## 2015-07-13 DIAGNOSIS — R278 Other lack of coordination: Secondary | ICD-10-CM | POA: Diagnosis not present

## 2015-07-13 LAB — HEMOGLOBIN A1C: Hgb A1c MFr Bld: 6.7 % — ABNORMAL HIGH (ref 4.6–6.5)

## 2015-07-17 ENCOUNTER — Ambulatory Visit (INDEPENDENT_AMBULATORY_CARE_PROVIDER_SITE_OTHER): Payer: Medicare Other | Admitting: Family Medicine

## 2015-07-17 ENCOUNTER — Encounter: Payer: Self-pay | Admitting: Family Medicine

## 2015-07-17 VITALS — BP 104/64 | HR 76 | Temp 98.3°F | Wt 160.2 lb

## 2015-07-17 DIAGNOSIS — M542 Cervicalgia: Secondary | ICD-10-CM | POA: Diagnosis not present

## 2015-07-17 DIAGNOSIS — Z119 Encounter for screening for infectious and parasitic diseases, unspecified: Secondary | ICD-10-CM | POA: Diagnosis not present

## 2015-07-17 DIAGNOSIS — E119 Type 2 diabetes mellitus without complications: Secondary | ICD-10-CM | POA: Diagnosis not present

## 2015-07-17 DIAGNOSIS — C61 Malignant neoplasm of prostate: Secondary | ICD-10-CM | POA: Diagnosis not present

## 2015-07-17 LAB — HM DIABETES FOOT EXAM

## 2015-07-17 MED ORDER — AMOXICILLIN 500 MG PO CAPS: 2000.0000 mg | ORAL_CAPSULE | Freq: Every day | ORAL | Status: DC

## 2015-07-17 NOTE — Progress Notes (Signed)
Pre visit review using our clinic review tool, if applicable. No additional management support is needed unless otherwise documented below in the visit note.  We talked about his prostate cancer dx and treatment.  He is regaining continence in the meantime.  He is still working on his home exercises.  His f/u PSA was zero per uro.    Diabetes:  Using medications without difficulties: yes Hypoglycemic episodes: no Hyperglycemic episodes:no Feet problems:no Blood Sugars averaging: ~110 in the AM eye exam within last year: done about 2 months ago, no retinopathy He is back to walking now and working on his diet.   A1c at goal, d/w pt.   Flu shot prev done, ~03/2015  Pain with neck ROM recently, better with celebrex and tylenol.  No trauma.  He has changed his pillow with some improvement.    PMH and SH reviewed  Meds, vitals, and allergies reviewed.   ROS: See HPI.  Otherwise negative.    GEN: nad, alert and oriented HEENT: mucous membranes moist NECK: supple w/o LA CV: rrr. PULM: ctab, no inc wob ABD: soft, +bs EXT: no edema SKIN: no acute rash  Diabetic foot exam: Normal inspection No skin breakdown No calluses  Normal DP pulses Normal sensation to light touch and monofilament Nails normal

## 2015-07-17 NOTE — Assessment & Plan Note (Addendum)
>  25 minutes spent in face to face time with patient, >50% spent in counselling or coordination of care. Recheck labs before a physical in about 6 months.  Keep working on diet and exercise in the meantime No change in meds.  Labs d/w pt.

## 2015-07-17 NOTE — Patient Instructions (Addendum)
Recheck labs before a physical in about 6 months.   Keep working on your diet and exercise in the meantime.   Keep stretching your neck in the meantime.

## 2015-07-19 DIAGNOSIS — M542 Cervicalgia: Secondary | ICD-10-CM | POA: Insufficient documentation

## 2015-07-19 NOTE — Assessment & Plan Note (Signed)
Some better already, continue stretching.  Normal ROM on exam, not stiff, no midline pain, okay for outpatient f/u.

## 2015-07-19 NOTE — Assessment & Plan Note (Signed)
Per uro.

## 2015-10-05 DIAGNOSIS — C61 Malignant neoplasm of prostate: Secondary | ICD-10-CM | POA: Diagnosis not present

## 2015-10-06 ENCOUNTER — Other Ambulatory Visit: Payer: Self-pay | Admitting: *Deleted

## 2015-10-06 MED ORDER — METFORMIN HCL 1000 MG PO TABS
ORAL_TABLET | ORAL | Status: DC
Start: 2015-10-06 — End: 2016-01-14

## 2015-10-12 DIAGNOSIS — N5201 Erectile dysfunction due to arterial insufficiency: Secondary | ICD-10-CM | POA: Diagnosis not present

## 2015-10-12 DIAGNOSIS — Z0000 Encounter for general adult medical examination without abnormal findings: Secondary | ICD-10-CM | POA: Diagnosis not present

## 2015-10-12 DIAGNOSIS — C61 Malignant neoplasm of prostate: Secondary | ICD-10-CM | POA: Diagnosis not present

## 2015-10-12 DIAGNOSIS — N393 Stress incontinence (female) (male): Secondary | ICD-10-CM | POA: Diagnosis not present

## 2015-11-02 ENCOUNTER — Other Ambulatory Visit: Payer: Self-pay | Admitting: *Deleted

## 2015-11-02 MED ORDER — PRAVASTATIN SODIUM 40 MG PO TABS: ORAL_TABLET | ORAL | Status: DC

## 2015-12-25 ENCOUNTER — Other Ambulatory Visit: Payer: Self-pay | Admitting: Family Medicine

## 2015-12-28 ENCOUNTER — Other Ambulatory Visit: Payer: Self-pay | Admitting: Family Medicine

## 2016-01-04 ENCOUNTER — Encounter: Payer: Self-pay | Admitting: Family Medicine

## 2016-01-04 ENCOUNTER — Ambulatory Visit (INDEPENDENT_AMBULATORY_CARE_PROVIDER_SITE_OTHER): Payer: Medicare Other | Admitting: Family Medicine

## 2016-01-04 ENCOUNTER — Other Ambulatory Visit: Payer: Self-pay | Admitting: Family Medicine

## 2016-01-04 VITALS — BP 116/74 | HR 77 | Temp 98.3°F | Wt 159.0 lb

## 2016-01-04 DIAGNOSIS — M779 Enthesopathy, unspecified: Principal | ICD-10-CM

## 2016-01-04 DIAGNOSIS — E119 Type 2 diabetes mellitus without complications: Secondary | ICD-10-CM

## 2016-01-04 DIAGNOSIS — M778 Other enthesopathies, not elsewhere classified: Secondary | ICD-10-CM

## 2016-01-04 DIAGNOSIS — Z119 Encounter for screening for infectious and parasitic diseases, unspecified: Secondary | ICD-10-CM

## 2016-01-04 DIAGNOSIS — M6588 Other synovitis and tenosynovitis, other site: Secondary | ICD-10-CM

## 2016-01-04 LAB — LIPID PANEL
CHOLESTEROL: 126 mg/dL (ref 0–200)
HDL: 56.6 mg/dL (ref >39.00)
LDL Cholesterol: 59 mg/dL (ref 0–99)
NonHDL: 69.46
TRIGLYCERIDES: 53 mg/dL (ref 0.0–149.0)
Total CHOL/HDL Ratio: 2
VLDL: 10.6 mg/dL (ref 0.0–40.0)

## 2016-01-04 LAB — COMPREHENSIVE METABOLIC PANEL
ALBUMIN: 4.6 g/dL (ref 3.5–5.2)
ALK PHOS: 76 U/L (ref 39–117)
ALT: 14 U/L (ref 0–53)
AST: 19 U/L (ref 0–37)
BILIRUBIN TOTAL: 0.5 mg/dL (ref 0.2–1.2)
BUN: 35 mg/dL — ABNORMAL HIGH (ref 6–23)
CALCIUM: 9.9 mg/dL (ref 8.4–10.5)
CO2: 24 meq/L (ref 19–32)
CREATININE: 1.94 mg/dL — AB (ref 0.40–1.50)
Chloride: 107 mEq/L (ref 96–112)
GFR: 36.57 mL/min — AB (ref >60.00)
Glucose, Bld: 149 mg/dL — ABNORMAL HIGH (ref 70–99)
Potassium: 5.6 mEq/L — ABNORMAL HIGH (ref 3.5–5.1)
Sodium: 137 mEq/L (ref 135–145)
TOTAL PROTEIN: 7.5 g/dL (ref 6.0–8.3)

## 2016-01-04 LAB — HEMOGLOBIN A1C: Hgb A1c MFr Bld: 6.6 % — ABNORMAL HIGH (ref 4.6–6.5)

## 2016-01-04 NOTE — Patient Instructions (Signed)
Use thumb splints (thumb spica splints) for now.  I think this is likely tendonitis.   Ice may help.  If not better, then let me know.  Take care.  Glad to see you.

## 2016-01-04 NOTE — Progress Notes (Signed)
Pre visit review using our clinic review tool, if applicable. No additional management support is needed unless otherwise documented below in the visit note.  Back 3.5 weeks ago, he had a tick bite on the L popliteal area.  Small knot in the area but that resolved.  He is using DEET in the meantime.  Then found another tick on the scrotum.  Both were removed easily, completely.  Then had some mild back pain (a "catch") that went away.  Then similar "catch" in the L hip, then resolved.    Now with B thumb pain and wrist pain.  Pain with turning the steering wheel.  No trauma, no triggers o/w except for the fact that he has spread a lot of mulch recently.    No rash.  No acute joint erythema/edema.  No fevers.  He doesn't feel unwell o/w.    Followed by uro with last PSA neg.    Meds, vitals, and allergies reviewed.   ROS: Per HPI unless specifically indicated in ROS section   nad ncat Normal ROM B shoulders and elbows.  Normal ROM B wrists, tinel neg B B finklestein's positive on the thumbs.  NV intact in the hands o/w w/o acute erythema or rash or swelling.

## 2016-01-04 NOTE — Assessment & Plan Note (Signed)
Likely from yard work, d/w pt . rx for B thumb spica braces given at the [facility].  If systemic sx, then notify me.  Not likely to be related to tick bite or statin at this point.  He agrees. No need to image.  See AVS.

## 2016-01-05 ENCOUNTER — Other Ambulatory Visit: Payer: Self-pay | Admitting: Family Medicine

## 2016-01-05 DIAGNOSIS — R7989 Other specified abnormal findings of blood chemistry: Secondary | ICD-10-CM

## 2016-01-05 LAB — HEPATITIS C ANTIBODY: HCV AB: NEGATIVE

## 2016-01-07 ENCOUNTER — Ambulatory Visit (INDEPENDENT_AMBULATORY_CARE_PROVIDER_SITE_OTHER): Payer: Medicare Other | Admitting: Family Medicine

## 2016-01-07 ENCOUNTER — Encounter: Payer: Self-pay | Admitting: Family Medicine

## 2016-01-07 ENCOUNTER — Ambulatory Visit (INDEPENDENT_AMBULATORY_CARE_PROVIDER_SITE_OTHER): Payer: Medicare Other

## 2016-01-07 ENCOUNTER — Other Ambulatory Visit: Payer: Medicare Other

## 2016-01-07 VITALS — BP 122/88 | HR 92 | Temp 98.5°F | Ht 65.0 in | Wt 156.2 lb

## 2016-01-07 DIAGNOSIS — Z0000 Encounter for general adult medical examination without abnormal findings: Secondary | ICD-10-CM | POA: Diagnosis not present

## 2016-01-07 DIAGNOSIS — R7989 Other specified abnormal findings of blood chemistry: Secondary | ICD-10-CM

## 2016-01-07 DIAGNOSIS — R748 Abnormal levels of other serum enzymes: Secondary | ICD-10-CM

## 2016-01-07 LAB — BASIC METABOLIC PANEL
BUN: 27 mg/dL — ABNORMAL HIGH (ref 6–23)
CHLORIDE: 103 meq/L (ref 96–112)
CO2: 23 mEq/L (ref 19–32)
Calcium: 10.3 mg/dL (ref 8.4–10.5)
Creatinine, Ser: 1.92 mg/dL — ABNORMAL HIGH (ref 0.40–1.50)
GFR: 37.01 mL/min — AB (ref >60.00)
GLUCOSE: 115 mg/dL — AB (ref 70–99)
POTASSIUM: 4.2 meq/L (ref 3.5–5.1)
SODIUM: 138 meq/L (ref 135–145)

## 2016-01-07 NOTE — Progress Notes (Signed)
Subjective:   Guy [name] is a 70 y.o. male who presents for Medicare Annual/Subsequent preventive examination.  Review of Systems:  N/A Cardiac Risk Factors include: advanced age (>90men, >29 women);male gender;diabetes mellitus;dyslipidemia;hypertension     Objective:    Vitals: BP 122/88 mmHg  Pulse 92  Temp(Src) 98.5 F (36.9 C) (Oral)  Ht 5\' 5"  (1.651 m)  Wt 156 lb 4 oz (70.875 kg)  BMI 26.00 kg/m2  SpO2 98%  Body mass index is 26 kg/(m^2).  Tobacco History  Smoking status  . Never Smoker   Smokeless tobacco  . Never Used     Counseling given: No   Past Medical History  Diagnosis Date  . GERD (gastroesophageal reflux disease)   . Arthritis   . Elevated PSA   . History of basal cell carcinoma excision     1990's--  moh's surg. of nose  . History of hiatal hernia   . Wears contact lenses   . Prostate cancer ([place]) 2016  . Diabetes mellitus type II     oral meds  . Anxiety     not clinical  . Neck complaint     "stiffness, catches"-denies numbness, tingling of arms  . Cataract    Past Surgical History  Procedure Laterality Date  . Total hip arthroplasty Right 08/ 2000  . Esophagogastroduodenoscopy  07/29/05    [facility]  . Prostate biopsy  2014  . Tonsillectomy  as a child  . Colonoscopy  06-01-2011  . Mohs surgery  1990's    nose  . Prostate biopsy N/A 03/06/2015    Procedure: SATURATION BIOPSY TRANSRECTAL ULTRASONIC PROSTATE (TUBP);  Surgeon: Alexis Frock, MD;  Location[facility];  Service: Urology;  Laterality: N/A;  . Robot assisted laparoscopic radical prostatectomy N/A 05/06/2015    Procedure: ROBOTIC ASSISTED LAPAROSCOPIC RADICAL PROSTATECTOMY;  Surgeon: Alexis Frock, MD;  Location: [facility];  Service: Urology;  Laterality: N/A;  . Lymphadenectomy N/A 05/06/2015    Procedure: LYMPH NODE DISSECTION;  Surgeon: Alexis Frock, MD;  Location: [facility];  Service: Urology;  Laterality: N/A;   Family History  Problem Relation  Age of Onset  . COPD Mother     smoker  . Cancer Mother     unknown primary  . Alzheimer's disease Father   . Hypertension Father   . Diabetes Father   . Obesity Brother   . Hypertension Brother   . Diabetes Brother   . Heart disease Brother     heart valve  . Diabetes Brother   . Prostate cancer Neg Hx   . Colon cancer Neg Hx    History  Sexual Activity  . Sexual Activity: Not Currently    Outpatient Encounter Prescriptions as of 01/07/2016  Medication Sig  . amoxicillin (AMOXIL) 500 MG capsule Take 4 capsules (2,000 mg total) by mouth daily. Before dental work.  . celecoxib (CELEBREX) 200 MG capsule TAKE ONE (1) CAPSULE BY MOUTH DAILY AS NEEDED FOR PAIN  . metFORMIN (GLUCOPHAGE) 1000 MG tablet TAKE 1 TABLET BY MOUTH TWICE A DAY WITH A MEAL  . omeprazole (PRILOSEC) 20 MG capsule Take 20 mg by mouth every morning.   . pravastatin (PRAVACHOL) 40 MG tablet TAKE 1 TABLET BY MOUTH EVERY EVENING AS DIRECTED.  . ramipril (ALTACE) 5 MG capsule TAKE ONE (1) CAPSULE BY MOUTH EACH DAY   No facility-administered encounter medications on file as of 01/07/2016.    Activities of Daily Living In your present state of health,  do you have any difficulty performing the following activities: 01/07/2016 05/06/2015  Hearing? N -  Vision? N -  Difficulty concentrating or making decisions? N -  Walking or climbing stairs? N -  Dressing or bathing? N -  Doing errands, shopping? N N  Preparing Food and eating ? N -  Using the Toilet? N -  In the past six months, have you accidently leaked urine? N -  Do you have problems with loss of bowel control? N -  Managing your Medications? N -  Managing your Finances? N -  Housekeeping or managing your Housekeeping? N -    Patient Care Team: Tonia Ghent, MD as PCP - General (Family Medicine) Lupita Raider, DO as Referring Physician (Optometry) Alexis Frock, MD as Consulting Physician (Urology)   Assessment:     Hearing Screening   125Hz   250Hz  500Hz  1000Hz  2000Hz  4000Hz  8000Hz   Right ear:   40 40 40 40   Left ear:   40 40 40 40   Vision Screening Comments: Last vision exam in Sept 2016 with Dr. Joan Flores   Exercise Activities and Dietary recommendations Current Exercise Habits: Home exercise routine, Type of exercise: walking, Time (Minutes): > 60, Frequency (Times/Week): 4, Weekly Exercise (Minutes/Week): 0, Intensity: Moderate, Exercise limited by: None identified  Goals    . Increase physical activity     Starting 01/07/2016, I will continue to exercise for at least 60 min 4 days per week.       Fall Risk Fall Risk  01/07/2016 01/07/2016 01/13/2015 01/08/2014 06/25/2012  Falls in the past year? No No No No Yes  Number falls in past yr: - - - - 1  Injury with Fall? - - - - No   Depression Screen PHQ 2/9 Scores 01/07/2016 01/07/2016 01/13/2015 01/08/2014  PHQ - 2 Score 0 0 0 0    Cognitive Testing MMSE - Mini Mental State Exam 01/07/2016  Orientation to time 5  Orientation to Place 5  Registration 3  Attention/ Calculation 0  Recall 3  Language- name 2 objects 0  Language- repeat 1  Language- follow 3 step command 3  Language- read & follow direction 0  Write a sentence 0  Copy design 0  Total score 20   PLEASE NOTE: A Mini-Cog screen was completed. Maximum score is 20. A value of 0 denotes this part of Folstein MMSE was not completed or the patient failed this part of the Mini-Cog screening.   Mini-Cog Screening Orientation to Time - Max 5 pts Orientation to Place - Max 5 pts Registration - Max 3 pts Recall - Max 3 pts Language Repeat - Max 1 pts Language Follow 3 Step Command - Max 3 pts  Immunization History  Administered Date(s) Administered  . Influenza Split 06/24/2011  . Influenza Whole 05/09/2007, 04/27/2009  . Influenza, Seasonal, Injecte, Preservative Fre 06/25/2012  . Influenza-Unspecified 06/24/2014, 03/21/2015  . Pneumococcal Conjugate-13 09/09/2014  . Pneumococcal Polysaccharide-23  05/20/1997, 06/24/2011  . Td 03/12/2001, 06/24/2011  . Zoster 07/24/2006   Screening Tests Health Maintenance  Topic Date Due  . INFLUENZA VACCINE  01/19/2016  . OPHTHALMOLOGY EXAM  05/16/2016  . COLONOSCOPY  05/31/2016  . HEMOGLOBIN A1C  07/06/2016  . FOOT EXAM  07/16/2016  . TETANUS/TDAP  06/23/2021  . ZOSTAVAX  Completed  . Hepatitis C Screening  Completed  . PNA vac Low Risk Adult  Completed      Plan:     I have personally reviewed and addressed  the Medicare Annual Wellness questionnaire and have noted the following in the patient's chart:  A. Medical and social history B. Use of alcohol, tobacco or illicit drugs  C. Current medications and supplements D. Functional ability and status E.  Nutritional status F.  Physical activity G. Advance directives H. List of other physicians I.  Hospitalizations, surgeries, and ER visits in previous 12 months J.  [place] to include hearing, vision, cognitive, depression L. Referrals and appointments - none  In addition, I have reviewed and discussed with patient certain preventive protocols, quality metrics, and best practice recommendations. A written personalized care plan for preventive services as well as general preventive health recommendations were provided to patient.  See attached scanned questionnaire for additional information.   Signed,   Lindell Noe, MHA, BS, LPN Health Advisor

## 2016-01-07 NOTE — Patient Instructions (Signed)
Guy [name] , Thank you for taking time to come for your Medicare Wellness Visit. I appreciate your ongoing commitment to your health goals. Please review the following plan we discussed and let me know if I can assist you in the future.   These are the goals we discussed: Goals    . Increase physical activity     Starting 01/07/2016, I will continue to exercise for at least 60 min 4 days per week.        This is a list of the screening recommended for you and due dates:  Health Maintenance  Topic Date Due  . Flu Shot  01/19/2016  . Eye exam for diabetics  05/16/2016  . Colon Cancer Screening  05/31/2016  . Hemoglobin A1C  07/06/2016  . Complete foot exam   07/16/2016  . Tetanus Vaccine  06/23/2021  . Shingles Vaccine  Completed  .  Hepatitis C: One time screening is recommended by Center for Disease Control  (CDC) for  adults born from [birth year] through 1965.   Completed  . Pneumonia vaccines  Completed    Preventive Care for Adults  A healthy lifestyle and preventive care can promote health and wellness. Preventive health guidelines for adults include the following key practices.  . A routine yearly physical is a good way to check with your health care provider about your health and preventive screening. It is a chance to share any concerns and updates on your health and to receive a thorough exam.  . Visit your dentist for a routine exam and preventive care every 6 months. Brush your teeth twice a day and floss once a day. Good oral hygiene prevents tooth decay and gum disease.  . The frequency of eye exams is based on your age, health, family medical history, use  of contact lenses, and other factors. Follow your health care provider's ecommendations for frequency of eye exams.  . Eat a healthy diet. Foods like vegetables, fruits, whole grains, low-fat dairy products, and lean protein foods contain the nutrients you need without too many calories. Decrease your intake of foods high  in solid fats, added sugars, and salt. Eat the right amount of calories for you. Get information about a proper diet from your health care provider, if necessary.  . Regular physical exercise is one of the most important things you can do for your health. Most adults should get at least 150 minutes of moderate-intensity exercise (any activity that increases your heart rate and causes you to sweat) each week. In addition, most adults need muscle-strengthening exercises on 2 or more days a week.  Silver Sneakers may be a benefit available to you. To determine eligibility, you may visit the website: www.silversneakers.com or contact program at (323) 626-1897 Mon-Fri between 8AM-8PM.   . Maintain a healthy weight. The body mass index (BMI) is a screening tool to identify possible weight problems. It provides an estimate of body fat based on height and weight. Your health care provider can find your BMI and can help you achieve or maintain a healthy weight.   For adults 20 years and older: ? A BMI below 18.5 is considered underweight. ? A BMI of 18.5 to 24.9 is normal. ? A BMI of 25 to 29.9 is considered overweight. ? A BMI of 30 and above is considered obese.   . Maintain normal blood lipids and cholesterol levels by exercising and minimizing your intake of saturated fat. Eat a balanced diet with plenty of fruit and vegetables.  Blood tests for lipids and cholesterol should begin at age 39 and be repeated every 5 years. If your lipid or cholesterol levels are high, you are over 50, or you are at high risk for heart disease, you may need your cholesterol levels checked more frequently. Ongoing high lipid and cholesterol levels should be treated with medicines if diet and exercise are not working.  . If you smoke, find out from your health care provider how to quit. If you do not use tobacco, please do not start.  . If you choose to drink alcohol, please do not consume more than 2 drinks per day. One  drink is considered to be 12 ounces (355 mL) of beer, 5 ounces (148 mL) of wine, or 1.5 ounces (44 mL) of liquor.  . If you are 77-63 years old, ask your health care provider if you should take aspirin to prevent strokes.  . Use sunscreen. Apply sunscreen liberally and repeatedly throughout the day. You should seek shade when your shadow is shorter than you. Protect yourself by wearing long sleeves, pants, a wide-brimmed hat, and sunglasses year round, whenever you are outdoors.  . Once a month, do a whole body skin exam, using a mirror to look at the skin on your back. Tell your health care provider of new moles, moles that have irregular borders, moles that are larger than a pencil eraser, or moles that have changed in shape or color.

## 2016-01-07 NOTE — Patient Instructions (Signed)
Don't take any ibuprofen.  Stay off the ramipril and celebrex for now.  Go to the lab on the way out.  We'll contact you with your lab report. Take tylenol if needed for pain.  Drink plenty of fluid/water in the meantime.   Take care.  Glad to see you.

## 2016-01-07 NOTE — Progress Notes (Signed)
Pre visit review using our clinic review tool, if applicable. No additional management support is needed unless otherwise documented below in the visit note. 

## 2016-01-07 NOTE — Progress Notes (Signed)
Cr was up, now off rampiril and celebrex for now.    He is in B thumb spica braces.  In the meantime he had been taking some extra ibuprofen for back pain.  He had been working in the yard and likely got dehydrated before having labs done.    He is having more neck pain in the meantime, but less so today than yesterday. Unclear how much of the recent pain was from working in the yard.    Meds, vitals, and allergies reviewed.   ROS: Per HPI unless specifically indicated in ROS section   GEN: nad, alert and oriented HEENT: mucous membranes moist NECK: supple w/o LA CV: rrr.  no murmur PULM: ctab, no inc wob ABD: soft, +bs EXT: no edema B thumb spica braces in place.

## 2016-01-07 NOTE — Progress Notes (Signed)
PCP notes:  Health maintenance: No gaps identified or addressed  Abnormal screenings: None  Patient concerns: None  Nurse concerns: None  Next PCP appt: 01/07/16 @ 1400  I reviewed health advisor's note, was available for consultation on the day of service listed in this note, and agree with documentation and plan. Elsie Stain, MD.

## 2016-01-08 ENCOUNTER — Other Ambulatory Visit: Payer: Self-pay | Admitting: Family Medicine

## 2016-01-08 DIAGNOSIS — R7989 Other specified abnormal findings of blood chemistry: Secondary | ICD-10-CM | POA: Insufficient documentation

## 2016-01-08 NOTE — Assessment & Plan Note (Signed)
Cr was up, now off rampiril and celebrex for now.  See notes on labs.  He agrees.   Path/phys of elevated cr d/w pt. Okay for outpatient f/u.

## 2016-01-14 ENCOUNTER — Encounter: Payer: Self-pay | Admitting: Family Medicine

## 2016-01-14 ENCOUNTER — Ambulatory Visit (INDEPENDENT_AMBULATORY_CARE_PROVIDER_SITE_OTHER): Payer: Medicare Other | Admitting: Family Medicine

## 2016-01-14 VITALS — BP 122/70 | HR 83 | Temp 98.4°F | Ht 65.0 in | Wt 156.5 lb

## 2016-01-14 DIAGNOSIS — M6588 Other synovitis and tenosynovitis, other site: Secondary | ICD-10-CM | POA: Diagnosis not present

## 2016-01-14 DIAGNOSIS — R7989 Other specified abnormal findings of blood chemistry: Secondary | ICD-10-CM

## 2016-01-14 DIAGNOSIS — E785 Hyperlipidemia, unspecified: Secondary | ICD-10-CM

## 2016-01-14 DIAGNOSIS — M779 Enthesopathy, unspecified: Secondary | ICD-10-CM

## 2016-01-14 DIAGNOSIS — E119 Type 2 diabetes mellitus without complications: Secondary | ICD-10-CM

## 2016-01-14 DIAGNOSIS — Z7189 Other specified counseling: Secondary | ICD-10-CM

## 2016-01-14 DIAGNOSIS — R748 Abnormal levels of other serum enzymes: Secondary | ICD-10-CM

## 2016-01-14 DIAGNOSIS — K219 Gastro-esophageal reflux disease without esophagitis: Secondary | ICD-10-CM

## 2016-01-14 DIAGNOSIS — M778 Other enthesopathies, not elsewhere classified: Secondary | ICD-10-CM

## 2016-01-14 LAB — BASIC METABOLIC PANEL WITH GFR
BUN: 36 mg/dL — ABNORMAL HIGH (ref 6–23)
CO2: 24 meq/L (ref 19–32)
Calcium: 10 mg/dL (ref 8.4–10.5)
Chloride: 103 meq/L (ref 96–112)
Creatinine, Ser: 2.04 mg/dL — ABNORMAL HIGH (ref 0.40–1.50)
GFR: 34.51 mL/min — ABNORMAL LOW
Glucose, Bld: 255 mg/dL — ABNORMAL HIGH (ref 70–99)
Potassium: 4.4 meq/L (ref 3.5–5.1)
Sodium: 136 meq/L (ref 135–145)

## 2016-01-14 MED ORDER — RANITIDINE HCL 150 MG PO TABS: 150.0000 mg | ORAL_TABLET | Freq: Every day | ORAL | Status: DC | PRN

## 2016-01-14 NOTE — Patient Instructions (Addendum)
Call about a colonoscopy this winter and let us know if you need help getting set up.   Stop prilosec, change to otc zantac/ranitidine if needed.  Go to the lab on the way out.  We'll contact you with your lab report. Rosaria Ferries will call about your referral. Take care.  Glad to see you.  Plan on recheck A1c in about 3 months.  If you have a lot of sugar elevations in the meantime, then let me know.

## 2016-01-14 NOTE — Assessment & Plan Note (Signed)
Living will d/w pt.  Would have Je[facility] Sow designated if patient were incapacitated.

## 2016-01-14 NOTE — Progress Notes (Signed)
Pre visit review using our clinic review tool, if applicable. No additional management support is needed unless otherwise documented below in the visit note.  Taking tylenol for hand pain.  Does better with med, worse in the middle of the night when the med wears off.  He has dupuytren's contractures B hands. Had seen hand clinic prev. No lack of extension.    DM2.  Off metformin in the meantime due to Cr.  D/w pt. Sugar was 111 yesterday, off metformin.     Elevated Cr. No sig change on last level.  D/w pt.  Due for recheck BMET today.  D/w pt about renal referral.    Would be due for f/u colonoscopy this winter.  D/w pt.  See AVS.    Living will d/w pt.  Would have Jeani Sow designated if patient were incapacitated.    PMH and SH reviewed  ROS: Per HPI unless specifically indicated in ROS section   Meds, vitals, and allergies reviewed.   GEN: nad, alert and oriented HEENT: mucous membranes moist NECK: supple w/o LA CV: rrr.  no murmur PULM: ctab, no inc wob ABD: soft, +bs EXT: no edema SKIN: no acute rash

## 2016-01-15 NOTE — Assessment & Plan Note (Signed)
Controlled, d/w pt.  Continue statin.

## 2016-01-15 NOTE — Assessment & Plan Note (Signed)
Stay off metformin for now with cr elevation, see notes on labs.

## 2016-01-15 NOTE — Assessment & Plan Note (Signed)
Stop ppi on the outside chance this can be contributing to his elevated Cr, can try zantac 150mg  q day prn.

## 2016-01-15 NOTE — Assessment & Plan Note (Signed)
Continue tylenol for now, avoid nsaids.

## 2016-01-15 NOTE — Assessment & Plan Note (Signed)
Refer to renal, see notes on labs.  He agrees. >25 minutes spent in face to face time with patient, >50% spent in counselling or coordination of care.

## 2016-02-08 ENCOUNTER — Other Ambulatory Visit: Payer: Self-pay | Admitting: Family Medicine

## 2016-02-09 DIAGNOSIS — N179 Acute kidney failure, unspecified: Secondary | ICD-10-CM | POA: Diagnosis not present

## 2016-02-09 DIAGNOSIS — E1129 Type 2 diabetes mellitus with other diabetic kidney complication: Secondary | ICD-10-CM | POA: Diagnosis not present

## 2016-02-09 DIAGNOSIS — N183 Chronic kidney disease, stage 3 (moderate): Secondary | ICD-10-CM | POA: Diagnosis not present

## 2016-02-09 DIAGNOSIS — E1122 Type 2 diabetes mellitus with diabetic chronic kidney disease: Secondary | ICD-10-CM | POA: Diagnosis not present

## 2016-02-10 ENCOUNTER — Other Ambulatory Visit: Payer: Self-pay | Admitting: Nephrology

## 2016-02-10 DIAGNOSIS — N179 Acute kidney failure, unspecified: Secondary | ICD-10-CM

## 2016-02-12 ENCOUNTER — Ambulatory Visit
Admission: RE | Admit: 2016-02-12 | Discharge: 2016-02-12 | Disposition: A | Discharge status: Home / Self Care | Payer: Medicare Other | Source: Ambulatory Visit | Attending: Nephrology | Admitting: Nephrology

## 2016-02-12 DIAGNOSIS — N179 Acute kidney failure, unspecified: Secondary | ICD-10-CM | POA: Diagnosis not present

## 2016-02-19 DIAGNOSIS — N183 Chronic kidney disease, stage 3 (moderate): Secondary | ICD-10-CM | POA: Diagnosis not present

## 2016-02-19 DIAGNOSIS — N179 Acute kidney failure, unspecified: Secondary | ICD-10-CM | POA: Diagnosis not present

## 2016-02-19 DIAGNOSIS — R81 Glycosuria: Secondary | ICD-10-CM | POA: Diagnosis not present

## 2016-02-19 DIAGNOSIS — E1129 Type 2 diabetes mellitus with other diabetic kidney complication: Secondary | ICD-10-CM | POA: Diagnosis not present

## 2016-02-25 ENCOUNTER — Other Ambulatory Visit: Payer: Self-pay | Admitting: Nephrology

## 2016-02-25 DIAGNOSIS — N183 Chronic kidney disease, stage 3 (moderate): Secondary | ICD-10-CM | POA: Diagnosis not present

## 2016-02-25 DIAGNOSIS — E1122 Type 2 diabetes mellitus with diabetic chronic kidney disease: Secondary | ICD-10-CM | POA: Diagnosis not present

## 2016-02-25 DIAGNOSIS — E875 Hyperkalemia: Secondary | ICD-10-CM | POA: Diagnosis not present

## 2016-02-25 DIAGNOSIS — N2889 Other specified disorders of kidney and ureter: Secondary | ICD-10-CM

## 2016-02-25 DIAGNOSIS — R81 Glycosuria: Secondary | ICD-10-CM | POA: Diagnosis not present

## 2016-03-07 ENCOUNTER — Ambulatory Visit
Admission: RE | Admit: 2016-03-07 | Discharge: 2016-03-07 | Disposition: A | Discharge status: Home / Self Care | Payer: Medicare Other | Source: Ambulatory Visit | Attending: Nephrology | Admitting: Nephrology

## 2016-03-07 DIAGNOSIS — K8020 Calculus of gallbladder without cholecystitis without obstruction: Secondary | ICD-10-CM | POA: Diagnosis not present

## 2016-03-07 DIAGNOSIS — N179 Acute kidney failure, unspecified: Secondary | ICD-10-CM | POA: Diagnosis not present

## 2016-03-07 DIAGNOSIS — N2889 Other specified disorders of kidney and ureter: Secondary | ICD-10-CM

## 2016-03-28 DIAGNOSIS — M545 Low back pain: Secondary | ICD-10-CM | POA: Diagnosis not present

## 2016-03-28 DIAGNOSIS — M542 Cervicalgia: Secondary | ICD-10-CM | POA: Diagnosis not present

## 2016-03-28 DIAGNOSIS — M79641 Pain in right hand: Secondary | ICD-10-CM | POA: Diagnosis not present

## 2016-04-05 DIAGNOSIS — C61 Malignant neoplasm of prostate: Secondary | ICD-10-CM | POA: Diagnosis not present

## 2016-04-11 ENCOUNTER — Other Ambulatory Visit: Payer: Self-pay | Admitting: Family Medicine

## 2016-04-11 DIAGNOSIS — E119 Type 2 diabetes mellitus without complications: Secondary | ICD-10-CM

## 2016-04-12 DIAGNOSIS — C61 Malignant neoplasm of prostate: Secondary | ICD-10-CM | POA: Diagnosis not present

## 2016-04-12 DIAGNOSIS — N5201 Erectile dysfunction due to arterial insufficiency: Secondary | ICD-10-CM | POA: Diagnosis not present

## 2016-04-12 DIAGNOSIS — N183 Chronic kidney disease, stage 3 (moderate): Secondary | ICD-10-CM | POA: Diagnosis not present

## 2016-04-12 DIAGNOSIS — N393 Stress incontinence (female) (male): Secondary | ICD-10-CM | POA: Diagnosis not present

## 2016-04-14 ENCOUNTER — Other Ambulatory Visit: Payer: Self-pay | Admitting: Family Medicine

## 2016-04-15 ENCOUNTER — Other Ambulatory Visit (INDEPENDENT_AMBULATORY_CARE_PROVIDER_SITE_OTHER): Payer: Medicare Other

## 2016-04-15 DIAGNOSIS — E119 Type 2 diabetes mellitus without complications: Secondary | ICD-10-CM

## 2016-04-15 LAB — BASIC METABOLIC PANEL
BUN: 23 mg/dL (ref 6–23)
CHLORIDE: 105 meq/L (ref 96–112)
CO2: 27 meq/L (ref 19–32)
CREATININE: 1.78 mg/dL — AB (ref 0.40–1.50)
Calcium: 10 mg/dL (ref 8.4–10.5)
GFR: 40.36 mL/min — ABNORMAL LOW (ref >60.00)
Glucose, Bld: 147 mg/dL — ABNORMAL HIGH (ref 70–99)
POTASSIUM: 4 meq/L (ref 3.5–5.1)
Sodium: 140 mEq/L (ref 135–145)

## 2016-04-15 LAB — HEMOGLOBIN A1C: HEMOGLOBIN A1C: 6.7 % — AB (ref 4.6–6.5)

## 2016-06-16 ENCOUNTER — Encounter: Payer: Self-pay | Admitting: Family Medicine

## 2016-06-16 DIAGNOSIS — N183 Chronic kidney disease, stage 3 (moderate): Secondary | ICD-10-CM | POA: Diagnosis not present

## 2016-06-16 DIAGNOSIS — E1122 Type 2 diabetes mellitus with diabetic chronic kidney disease: Secondary | ICD-10-CM | POA: Diagnosis not present

## 2016-07-05 DIAGNOSIS — E119 Type 2 diabetes mellitus without complications: Secondary | ICD-10-CM | POA: Diagnosis not present

## 2016-07-05 LAB — HM DIABETES EYE EXAM

## 2016-07-18 ENCOUNTER — Other Ambulatory Visit: Payer: Self-pay | Admitting: Family Medicine

## 2016-07-18 ENCOUNTER — Other Ambulatory Visit (INDEPENDENT_AMBULATORY_CARE_PROVIDER_SITE_OTHER): Payer: Medicare Other

## 2016-07-18 DIAGNOSIS — E119 Type 2 diabetes mellitus without complications: Secondary | ICD-10-CM

## 2016-07-18 LAB — BASIC METABOLIC PANEL
BUN: 24 mg/dL — ABNORMAL HIGH (ref 6–23)
CHLORIDE: 104 meq/L (ref 96–112)
CO2: 26 mEq/L (ref 19–32)
Calcium: 9.9 mg/dL (ref 8.4–10.5)
Creatinine, Ser: 1.84 mg/dL — ABNORMAL HIGH (ref 0.40–1.50)
GFR: 38.82 mL/min — ABNORMAL LOW (ref >60.00)
Glucose, Bld: 163 mg/dL — ABNORMAL HIGH (ref 70–99)
POTASSIUM: 4.8 meq/L (ref 3.5–5.1)
Sodium: 139 mEq/L (ref 135–145)

## 2016-07-18 LAB — HEMOGLOBIN A1C: HEMOGLOBIN A1C: 7.7 % — AB (ref 4.6–6.5)

## 2016-07-20 ENCOUNTER — Ambulatory Visit (INDEPENDENT_AMBULATORY_CARE_PROVIDER_SITE_OTHER): Payer: Medicare Other | Admitting: Family Medicine

## 2016-07-20 ENCOUNTER — Encounter: Payer: Self-pay | Admitting: Family Medicine

## 2016-07-20 DIAGNOSIS — R7989 Other specified abnormal findings of blood chemistry: Secondary | ICD-10-CM

## 2016-07-20 DIAGNOSIS — M778 Other enthesopathies, not elsewhere classified: Secondary | ICD-10-CM

## 2016-07-20 DIAGNOSIS — E119 Type 2 diabetes mellitus without complications: Secondary | ICD-10-CM | POA: Diagnosis not present

## 2016-07-20 DIAGNOSIS — M542 Cervicalgia: Secondary | ICD-10-CM | POA: Diagnosis not present

## 2016-07-20 DIAGNOSIS — K8020 Calculus of gallbladder without cholecystitis without obstruction: Secondary | ICD-10-CM

## 2016-07-20 DIAGNOSIS — M779 Enthesopathy, unspecified: Secondary | ICD-10-CM

## 2016-07-20 NOTE — Progress Notes (Signed)
CKD and DM2 f/u. He has seen renal in the meantime.  Cr d/w pt.  He is off celebrex. BP controlled today.    Prev incidentally noted gallstones.  Prev MRI with  1. No evidence of right renal mass or correlate for the ultrasound abnormality. 2. Cholelithiasis. No sx from gall stones.  D/w pt.  No RUQ pain.    Diabetes:  No medications Hypoglycemic episodes: no Hyperglycemic episodes: no Feet problems: no Blood Sugars averaging: usually ~ 120s, more recently has been up to ~140s.  eye exam within last year: yes He clearly doesn't do as well during the winter and he is looking very much forward to spring.   He has intentional weight loss over the years with diet.    He had seen ortho in the fall of 2017.  He was having neck and back pain.  He stopped pravastatin and he had less aches in the meantime.  He feels better in general.  His hands aren't back to normal, but are improved.  He wore splints for his thumbs prev.  We can consider statin retrial later.    PMH and SH reviewed  ROS: Per HPI unless specifically indicated in ROS section   Meds, vitals, and allergies reviewed.   GEN: nad, alert and oriented HEENT: mucous membranes moist NECK: supple w/o LA CV: rrr. PULM: ctab, no inc wob ABD: soft, +bs EXT: no edema SKIN: no acute rash He has B hand dupuytren contracture w/o loss of ROM.    Diabetic foot exam: Normal inspection No skin breakdown No calluses  Normal DP pulses Normal sensation to light touch and monofilament Nails normal

## 2016-07-20 NOTE — Patient Instructions (Addendum)
Recheck A1c in about 3-4 months.  Take care.  Glad to see you.  Update me as needed.  If you have more changes in your sugar in the meantime, then let me know.  I would stay off the statin for now and I'm glad you stopped it.  We can work on that later on.   It is worth considering PT if the neck pain isn't getting better.

## 2016-07-21 DIAGNOSIS — K8020 Calculus of gallbladder without cholecystitis without obstruction: Secondary | ICD-10-CM | POA: Insufficient documentation

## 2016-07-21 NOTE — Assessment & Plan Note (Signed)
Improved with splints. Could have been that statin was contributing. On statin and improved in the meantime. We can consider retry of lower dose pravastatin, approximately 10 mg, in the future. He agrees.

## 2016-07-21 NOTE — Assessment & Plan Note (Signed)
No change in medications at this point. He will work on diet and exercise more in the spring. Recheck in a few months. A1c above goal but I think he can get this down without extra medications. He agrees. Labs discussed with patient.

## 2016-07-21 NOTE — Assessment & Plan Note (Signed)
If he does not continue to improve off statin, then he can follow-up with physical therapy.

## 2016-07-21 NOTE — Assessment & Plan Note (Signed)
Incidentally noted. Discussed with patient. No symptoms. Update me as needed.

## 2016-07-21 NOTE — Assessment & Plan Note (Signed)
Off NSAIDs. Has had renal follow-up. Creatinine stable. Discussed with patient. No change in medications today.

## 2016-09-27 DIAGNOSIS — C61 Malignant neoplasm of prostate: Secondary | ICD-10-CM | POA: Diagnosis not present

## 2016-09-30 ENCOUNTER — Other Ambulatory Visit: Payer: Self-pay | Admitting: Family Medicine

## 2016-09-30 DIAGNOSIS — E119 Type 2 diabetes mellitus without complications: Secondary | ICD-10-CM

## 2016-10-04 DIAGNOSIS — C61 Malignant neoplasm of prostate: Secondary | ICD-10-CM | POA: Diagnosis not present

## 2016-10-04 DIAGNOSIS — N183 Chronic kidney disease, stage 3 (moderate): Secondary | ICD-10-CM | POA: Diagnosis not present

## 2016-10-04 DIAGNOSIS — N393 Stress incontinence (female) (male): Secondary | ICD-10-CM | POA: Diagnosis not present

## 2016-10-04 DIAGNOSIS — N5201 Erectile dysfunction due to arterial insufficiency: Secondary | ICD-10-CM | POA: Diagnosis not present

## 2016-10-17 ENCOUNTER — Other Ambulatory Visit (INDEPENDENT_AMBULATORY_CARE_PROVIDER_SITE_OTHER): Payer: Medicare Other

## 2016-10-17 DIAGNOSIS — E119 Type 2 diabetes mellitus without complications: Secondary | ICD-10-CM | POA: Diagnosis not present

## 2016-10-17 LAB — HEMOGLOBIN A1C: Hgb A1c MFr Bld: 8.3 % — ABNORMAL HIGH (ref 4.6–6.5)

## 2016-10-20 ENCOUNTER — Ambulatory Visit (INDEPENDENT_AMBULATORY_CARE_PROVIDER_SITE_OTHER): Payer: Medicare Other | Admitting: Family Medicine

## 2016-10-20 ENCOUNTER — Encounter: Payer: Self-pay | Admitting: Family Medicine

## 2016-10-20 VITALS — BP 120/80 | HR 77 | Temp 98.6°F | Wt 159.2 lb

## 2016-10-20 DIAGNOSIS — M542 Cervicalgia: Secondary | ICD-10-CM

## 2016-10-20 DIAGNOSIS — K219 Gastro-esophageal reflux disease without esophagitis: Secondary | ICD-10-CM

## 2016-10-20 DIAGNOSIS — R7989 Other specified abnormal findings of blood chemistry: Secondary | ICD-10-CM | POA: Diagnosis not present

## 2016-10-20 DIAGNOSIS — Z1211 Encounter for screening for malignant neoplasm of colon: Secondary | ICD-10-CM

## 2016-10-20 DIAGNOSIS — E119 Type 2 diabetes mellitus without complications: Secondary | ICD-10-CM

## 2016-10-20 MED ORDER — RANITIDINE HCL 150 MG PO TABS: 150.0000 mg | ORAL_TABLET | Freq: Two times a day (BID) | ORAL | Status: DC

## 2016-10-20 NOTE — Progress Notes (Signed)
Still with some GERD sx in spite of zantac qd.  It had prev been working well once a day.  Taking tums prn in the meantime. He has the occ sensation of food sticking but not in the last week- is better in the meantime.  "I've always eaten fast, and I've slowed down some" and that helped.  Some occ cough.  D/w pt about options.  See AVS.   He hasn't elevated the head of his bed due to his neck.  He has gotten a new pillow but has a old mattress.  The new pillow helps some.  He has some occ posterior spasms in his neck.  No sx now.  Happens when leaning over, that is the only time it bothers him now.  He tried heat yesterday.     Due for f/u colonoscopy.  dw pt.  Referred.    Diabetes:  No meds.  Hypoglycemic episodes: no Hyperglycemic episodes: no Feet problems: no Blood Sugars averaging: ~ 160s in the AMs, down to 140s more recently.   eye exam within last year: yes A1c up some, d/w pt.   Off metformin.   He started walking in the meantime and his joint aches in general are better, except for his neck.  He hasn't been walking as much in the last few months and he was expecting his A1c to be up some.   CKD.  Has seen renal. Off metformin.  Not on nsaids.    PMH and SH reviewed  ROS: Per HPI unless specifically indicated in ROS section   Meds, vitals, and allergies reviewed.   GEN: nad, alert and oriented HEENT: mucous membranes moist NECK: supple w/o LA CV: rrr. PULM: ctab, no inc wob ABD: soft, +bs EXT: no edema SKIN: no acute rash  Diabetic foot exam: Normal inspection No skin breakdown No calluses  Normal DP pulses Normal sensation to light touch and monofilament Nails normal

## 2016-10-20 NOTE — Progress Notes (Signed)
Pre visit review using our clinic review tool, if applicable. No additional management support is needed unless otherwise documented below in the visit note. 

## 2016-10-20 NOTE — Patient Instructions (Addendum)
Recheck A1c in about 3 months, update me as needed in the meantime.  Work on diet and exercise in the meantime.  Rosaria Ferries will call about your referral. Take care.  Glad to see you.

## 2016-10-21 DIAGNOSIS — Z1211 Encounter for screening for malignant neoplasm of colon: Secondary | ICD-10-CM | POA: Insufficient documentation

## 2016-10-21 NOTE — Assessment & Plan Note (Signed)
Avoiding NSAIDs. Off metformin. He has been seen by renal. Continue as is. >25 minutes spent in face to face time with patient, >50% spent in counselling or coordination of care.

## 2016-10-21 NOTE — Assessment & Plan Note (Signed)
Refer. Discussed with patient.

## 2016-10-21 NOTE — Assessment & Plan Note (Signed)
With possible esophageal spasm occasionally. Discussed with patient. Increase Zantac to twice a day, continue Tums, update me if not better. He agrees. We are going to refer to GI anyway because he is due for colonoscopy.

## 2016-10-21 NOTE — Assessment & Plan Note (Signed)
A1c was up some as patient was expecting. Discussed patient about diet and exercise. He'll continue to monitor blood sugar at home and we can recheck A1c in 3 months. He agrees. Labs d/w pt.

## 2016-10-21 NOTE — Assessment & Plan Note (Signed)
Likely due to muscle spasm due to underlying arthritis. Discussed with patient. He may need to rotate his mattress. Reasonable to go to PT. Refer. He agrees.

## 2016-10-24 ENCOUNTER — Telehealth: Payer: Self-pay | Admitting: Gastroenterology

## 2016-10-24 NOTE — Telephone Encounter (Signed)
Patient left a voice message that he was returning your call regarding a colonoscopy  [phone] or  [phone]

## 2016-10-26 ENCOUNTER — Other Ambulatory Visit: Payer: Self-pay

## 2016-10-26 DIAGNOSIS — Z1211 Encounter for screening for malignant neoplasm of colon: Secondary | ICD-10-CM

## 2016-10-26 DIAGNOSIS — M6281 Muscle weakness (generalized): Secondary | ICD-10-CM | POA: Diagnosis not present

## 2016-10-26 DIAGNOSIS — M542 Cervicalgia: Secondary | ICD-10-CM | POA: Diagnosis not present

## 2016-10-26 DIAGNOSIS — Z86010 Personal history of colonic polyps: Secondary | ICD-10-CM

## 2016-10-26 NOTE — Progress Notes (Signed)
Gastroenterology Pre-Procedure Review  Request Date: June 19th, 2018 (Tue) Requesting Physician: Dr. Allen Norris  PATIENT REVIEW QUESTIONS: The patient responded to the following health history questions as indicated:    1. Are you having any GI issues? yes (no) 2. Do you have a personal history of Polyps? yes (self) 3. Do you have a family history of Colon Cancer or Polyps? yes (father polyps) pt has had prostate cancer 4. Diabetes Mellitus? yes (self) 5. Joint replacements in the past 12 months?no 6. Major health problems in the past 3 months?no 7. Any artificial heart valves, MVP, or defibrillator?no    MEDICATIONS & ALLERGIES:    Patient reports the following regarding taking any anticoagulation/antiplatelet therapy:   Plavix, Coumadin, Eliquis, Xarelto, Lovenox, Pradaxa, Brilinta, or Effient? no Aspirin? no  Patient confirms/reports the following medications:  Current Outpatient Prescriptions  Medication Sig Dispense Refill  . amoxicillin (AMOXIL) 500 MG capsule Take 4 capsules (2,000 mg total) by mouth daily. Before dental work. 12 capsule 0  . ONETOUCH DELICA LANCETS 10U MISC USE AS DIRECTED 100 each 5  . ONETOUCH VERIO test strip USE TO TEST BLOOD GLUCOSE DAILY 100 each 3  . ranitidine (ZANTAC) 150 MG tablet Take 1 tablet (150 mg total) by mouth 2 (two) times daily.     No current facility-administered medications for this visit.     Patient confirms/reports the following allergies:  Allergies  Allergen Reactions  . Pravastatin Other (See Comments)    Aches at 40mg  dosing.     No orders of the defined types were placed in this encounter.   AUTHORIZATION INFORMATION Primary Insurance: 1D#: Group #:  Secondary Insurance: 1D#: Group #:  SCHEDULE INFORMATION: Date: 06/19 Dr. Allen Norris  Time: Location:[facility]

## 2016-10-27 ENCOUNTER — Telehealth: Payer: Self-pay | Admitting: Gastroenterology

## 2016-10-27 NOTE — Telephone Encounter (Signed)
10/27/16 Per Stanton Kidney at [facility] for [phone] / Z12.11, Z86.010 NO prior auth required for [facility] or BCBS.

## 2016-11-02 DIAGNOSIS — M542 Cervicalgia: Secondary | ICD-10-CM | POA: Diagnosis not present

## 2016-12-05 ENCOUNTER — Encounter: Payer: Self-pay | Admitting: *Deleted

## 2016-12-06 ENCOUNTER — Ambulatory Visit: Payer: Medicare Other | Admitting: Registered Nurse

## 2016-12-06 ENCOUNTER — Ambulatory Visit
Admission: RE | Admit: 2016-12-06 | Discharge: 2016-12-06 | Disposition: A | Discharge status: Home / Self Care | Payer: Medicare Other | Source: Ambulatory Visit | Attending: Gastroenterology | Admitting: Gastroenterology

## 2016-12-06 ENCOUNTER — Encounter
Admission: RE | Disposition: A | Discharge status: Home / Self Care | Payer: Self-pay | Source: Ambulatory Visit | Attending: Gastroenterology

## 2016-12-06 DIAGNOSIS — Z860100 Personal history of colon polyps, unspecified: Secondary | ICD-10-CM

## 2016-12-06 DIAGNOSIS — Z1211 Encounter for screening for malignant neoplasm of colon: Secondary | ICD-10-CM | POA: Insufficient documentation

## 2016-12-06 DIAGNOSIS — Z85828 Personal history of other malignant neoplasm of skin: Secondary | ICD-10-CM | POA: Diagnosis not present

## 2016-12-06 DIAGNOSIS — K219 Gastro-esophageal reflux disease without esophagitis: Secondary | ICD-10-CM | POA: Diagnosis not present

## 2016-12-06 DIAGNOSIS — E119 Type 2 diabetes mellitus without complications: Secondary | ICD-10-CM | POA: Diagnosis not present

## 2016-12-06 DIAGNOSIS — Z96641 Presence of right artificial hip joint: Secondary | ICD-10-CM | POA: Insufficient documentation

## 2016-12-06 DIAGNOSIS — K635 Polyp of colon: Secondary | ICD-10-CM | POA: Diagnosis not present

## 2016-12-06 DIAGNOSIS — M1990 Unspecified osteoarthritis, unspecified site: Secondary | ICD-10-CM | POA: Diagnosis not present

## 2016-12-06 DIAGNOSIS — F419 Anxiety disorder, unspecified: Secondary | ICD-10-CM | POA: Insufficient documentation

## 2016-12-06 DIAGNOSIS — K449 Diaphragmatic hernia without obstruction or gangrene: Secondary | ICD-10-CM | POA: Diagnosis not present

## 2016-12-06 DIAGNOSIS — D120 Benign neoplasm of cecum: Secondary | ICD-10-CM | POA: Insufficient documentation

## 2016-12-06 DIAGNOSIS — C61 Malignant neoplasm of prostate: Secondary | ICD-10-CM | POA: Diagnosis not present

## 2016-12-06 HISTORY — PX: COLONOSCOPY WITH PROPOFOL: SHX5780

## 2016-12-06 LAB — GLUCOSE, CAPILLARY: GLUCOSE-CAPILLARY: 202 mg/dL — AB (ref 65–99)

## 2016-12-06 SURGERY — COLONOSCOPY WITH PROPOFOL
Anesthesia: General

## 2016-12-06 MED ORDER — PROPOFOL 500 MG/50ML IV EMUL
INTRAVENOUS | Status: AC
  Filled 2016-12-06: qty 50

## 2016-12-06 MED ORDER — PHENYLEPHRINE HCL 10 MG/ML IJ SOLN
INTRAMUSCULAR | Status: AC
  Filled 2016-12-06: qty 1

## 2016-12-06 MED ORDER — PROPOFOL 500 MG/50ML IV EMUL
INTRAVENOUS | Status: DC | PRN
Start: 2016-12-06 — End: 2016-12-06
  Administered 2016-12-06: 160 ug/kg/min via INTRAVENOUS

## 2016-12-06 MED ORDER — SODIUM CHLORIDE 0.9 % IV SOLN
INTRAVENOUS | Status: DC
  Administered 2016-12-06: 08:00:00 via INTRAVENOUS

## 2016-12-06 MED ORDER — LIDOCAINE HCL (CARDIAC) 20 MG/ML IV SOLN
INTRAVENOUS | Status: DC | PRN
  Administered 2016-12-06: 30 mg via INTRAVENOUS

## 2016-12-06 MED ORDER — LIDOCAINE HCL (PF) 2 % IJ SOLN
INTRAMUSCULAR | Status: AC
  Filled 2016-12-06: qty 2

## 2016-12-06 MED ORDER — PROPOFOL 10 MG/ML IV BOLUS
INTRAVENOUS | Status: DC | PRN
  Administered 2016-12-06: 70 mg via INTRAVENOUS

## 2016-12-06 NOTE — Transfer of Care (Signed)
Immediate Anesthesia Transfer of Care Note  Patient: Guy [name]  Procedure(s) Performed: Procedure(s): COLONOSCOPY WITH PROPOFOL (N/A)  Patient Location: PACU  Anesthesia Type:General  Level of Consciousness: awake, alert  and oriented  Airway & Oxygen Therapy: Patient Spontanous Breathing and Patient connected to nasal cannula oxygen  Post-op Assessment: Report given to RN and Post -op Vital signs reviewed and stable  Post vital signs: Reviewed and stable  Last Vitals:  Vitals:   12/06/16 0710 12/06/16 0804  BP: 118/74 94/70  Pulse: (!) 106 86  Resp: 15 12  Temp: 36.7 C 36.1 C    Last Pain:  Vitals:   12/06/16 0804  TempSrc: Tympanic         Complications: No apparent anesthesia complications

## 2016-12-06 NOTE — Anesthesia Post-op Follow-up Note (Cosign Needed)
Anesthesia QCDR form completed.        

## 2016-12-06 NOTE — Anesthesia Procedure Notes (Signed)
Date/Time: 12/06/2016 7:46 AM Performed by: Hedda Slade Pre-anesthesia Checklist: Patient identified, Emergency Drugs available, Suction available, Patient being monitored and Timeout performed Patient Re-evaluated:Patient Re-evaluated prior to inductionOxygen Delivery Method: Nasal cannula

## 2016-12-06 NOTE — Anesthesia Postprocedure Evaluation (Signed)
Anesthesia Post Note  Patient: Guy [name]  Procedure(s) Performed: Procedure(s) (LRB): COLONOSCOPY WITH PROPOFOL (N/A)  Patient location during evaluation: Endoscopy Anesthesia Type: General Level of consciousness: awake and alert Pain management: pain level controlled Vital Signs Assessment: post-procedure vital signs reviewed and stable Respiratory status: spontaneous breathing, nonlabored ventilation, respiratory function stable and patient connected to nasal cannula oxygen Cardiovascular status: blood pressure returned to baseline and stable Postop Assessment: no signs of nausea or vomiting Anesthetic complications: no     Last Vitals:  Vitals:   12/06/16 0824 12/06/16 0834  BP: 95/72 110/88  Pulse: 82 71  Resp: 13 15  Temp:      Last Pain:  Vitals:   12/06/16 0804  TempSrc: Tympanic                 Martha Clan

## 2016-12-06 NOTE — H&P (Signed)
Guy Lame, MD [place] [name] [name], [name] [address] [phone] Fax : [phone]  Primary Care Physician:  Guy Ghent, MD Primary Gastroenterologist:  Dr. Allen [name]  Pre-Procedure History & Physical: HPI:  Guy [name] is a 71 y.o. male is here for an colonoscopy.   Past Medical History:  Diagnosis Date  . Anxiety    not clinical  . Arthritis   . Cataract   . Diabetes mellitus type II    oral meds  . Elevated PSA   . GERD (gastroesophageal reflux disease)   . History of basal cell carcinoma excision    1990's--  moh's surg. of nose  . History of hiatal hernia   . Neck complaint    "stiffness, catches"-denies numbness, tingling of arms  . Prostate cancer ([employer]) 2016  . Wears contact lenses     Past Surgical History:  Procedure Laterality Date  . COLONOSCOPY  06-01-2011  . ESOPHAGOGASTRODUODENOSCOPY  07/29/05   [facility]  . LYMPHADENECTOMY N/A 05/06/2015   Procedure: LYMPH NODE DISSECTION;  Surgeon: Guy Frock, MD;  Location: [facility];  Service: Urology;  Laterality: N/A;  . MOHS SURGERY  1990's   nose  . PROSTATE BIOPSY  2014  . PROSTATE BIOPSY N/A 03/06/2015   Procedure: SATURATION BIOPSY TRANSRECTAL ULTRASONIC PROSTATE (TUBP);  Surgeon: Guy Frock, MD;  [facility];  Service: Urology;  Laterality: N/A;  . ROBOT ASSISTED LAPAROSCOPIC RADICAL PROSTATECTOMY N/A 05/06/2015   Procedure: ROBOTIC ASSISTED LAPAROSCOPIC RADICAL PROSTATECTOMY;  Surgeon: Guy Frock, MD;  Location: [facility];  Service: Urology;  Laterality: N/A;  . TONSILLECTOMY  as a child  . TOTAL HIP ARTHROPLASTY Right 08/ 2000    Prior to Admission medications   Medication Sig Start Date End Date Taking? Authorizing Provider  acetaminophen (TYLENOL) 500 MG tablet Take 500 mg by mouth 3 (three) times daily.   Yes [provider]  ranitidine (ZANTAC) 150 MG tablet Take 1 tablet (150 mg total) by mouth 2 (two) times daily. 10/20/16  Yes  Guy Ghent, MD  amoxicillin (AMOXIL) 500 MG capsule Take 4 capsules (2,000 mg total) by mouth daily. Before dental work. Patient not taking: Reported on 12/06/2016 07/17/15   Guy Ghent, MD  [facility] LANCETS 50D MISC USE AS DIRECTED 04/14/16   Guy Ghent, MD  [facility] VERIO test strip USE TO TEST BLOOD GLUCOSE DAILY 02/08/16   Guy Ghent, MD    Allergies as of 10/26/2016 - Review Complete 10/20/2016  Allergen Reaction Noted  . Pravastatin Other (See Comments) 07/20/2016    Family History  Problem Relation Age of Onset  . COPD Mother        smoker  . Cancer Mother        unknown primary  . Alzheimer's disease Father   . Hypertension Father   . Diabetes Father   . Obesity Brother   . Hypertension Brother   . Diabetes Brother   . Heart disease Brother        heart valve  . Prostate cancer Neg Hx   . Colon cancer Neg Hx     Social History   Social History  . Marital status: Widowed    Spouse name: N/A  . Number of children: 1  . Years of education: N/A   Occupational History  . 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     Therapist, sports for a IT sales professional   Social History Main Topics  . Smoking status: Never  Smoker  . Smokeless tobacco: Never Used  . Alcohol use 0.0 oz/week     Comment: rare  . Drug use: No  . Sexual activity: Not Currently   Other Topics Concern  . Not on file   Social History Narrative   [employer] 2 years, E5 ([place] '69-71)   Married 1980, widowed 2014   1 step daughter    Tourist information centre manager   Retired from [employer]: See HPI, otherwise negative ROS  Physical Exam: BP 118/74   Pulse (!) 106   Temp 98.1 F (36.7 C) (Oral)   Resp 15   Ht 5\' 6"  (1.676 m)   Wt 163 lb (73.9 kg)   SpO2 99%   BMI 26.31 kg/m  General:   Alert,  pleasant and cooperative in NAD Head:  Normocephalic and atraumatic. Neck:  Supple; no masses or thyromegaly. Lungs:  Clear throughout to auscultation.    Heart:  Regular rate and  rhythm. Abdomen:  Soft, nontender and nondistended. Normal bowel sounds, without guarding, and without rebound.   Neurologic:  Alert and  oriented x4;  grossly normal neurologically.  Impression/Plan: Guy [name] is here for an colonoscopy to be performed for history of colon polyps  Risks, benefits, limitations, and alternatives regarding  colonoscopy have been reviewed with the patient.  Questions have been answered.  All parties agreeable.   Guy Lame, MD  12/06/2016, 7:36 AM

## 2016-12-06 NOTE — Anesthesia Preprocedure Evaluation (Signed)
Anesthesia Evaluation  Patient identified by MRN, date of birth, ID band Patient awake    Reviewed: Allergy & Precautions, H&P , NPO status , Patient's Chart, lab work & pertinent test results, reviewed documented beta blocker date and time   History of Anesthesia Complications Negative for: history of anesthetic complications  Airway Mallampati: II  TM Distance: >3 FB Neck ROM: full    Dental  (+) Teeth Intact, Dental Advidsory Given   Pulmonary neg pulmonary ROS,           Cardiovascular Exercise Tolerance: Good negative cardio ROS       Neuro/Psych negative neurological ROS  negative psych ROS   GI/Hepatic Neg liver ROS, hiatal hernia, GERD  ,  Endo/Other  diabetes  Renal/GU negative Renal ROS  negative genitourinary   Musculoskeletal   Abdominal   Peds  Hematology negative hematology ROS (+)   Anesthesia Other Findings Past Medical History: No date: Anxiety     Comment: not clinical No date: Arthritis No date: Cataract No date: Diabetes mellitus type II     Comment: oral meds No date: Elevated PSA No date: GERD (gastroesophageal reflux disease) No date: History of basal cell carcinoma excision     Comment: 1990's--  moh's surg. of nose No date: History of hiatal hernia No date: Neck complaint     Comment: "stiffness, catches"-denies numbness, tingling              of arms 2016: Prostate cancer ([place]) No date: Wears contact lenses   Reproductive/Obstetrics negative OB ROS                             Anesthesia Physical Anesthesia Plan  ASA: II  Anesthesia Plan: General   Post-op Pain Management:    Induction: Intravenous  PONV Risk Score and Plan: 2 and Propofol  Airway Management Planned: Natural Airway  Additional Equipment:   Intra-op Plan:   Post-operative Plan:   Informed Consent: I have reviewed the patients History and Physical, chart, labs and  discussed the procedure including the risks, benefits and alternatives for the proposed anesthesia with the patient or authorized representative who has indicated his/her understanding and acceptance.   Dental Advisory Given  Plan Discussed with: Anesthesiologist, CRNA and Surgeon  Anesthesia Plan Comments:         Anesthesia Quick Evaluation

## 2016-12-06 NOTE — Op Note (Signed)
[facility] Gastroenterology Patient Name: Guy [name] Procedure Date: 12/06/2016 7:15 AM MRN: [id number] Account #: [id number] Date of Birth: [birth year] Admit Type: Outpatient Age: 71 Room: [facility] ENDO ROOM 4 Gender: Male Note Status: Finalized Procedure:            Colonoscopy Indications:          High risk colon cancer surveillance: Personal history                        of colonic polyps Providers:            [name] MD, MD Referring MD:         Elveria Rising. Damita Dunnings, MD (Referring MD) Medicines:            Propofol per Anesthesia Complications:        No immediate complications. Procedure:            Pre-Anesthesia Assessment:                       - Prior to the procedure, a History and Physical was                        performed, and patient medications and allergies were                        reviewed. The patient's tolerance of previous                        anesthesia was also reviewed. The risks and benefits of                        the procedure and the sedation options and risks were                        discussed with the patient. All questions were                        answered, and informed consent was obtained. Prior                        Anticoagulants: The patient has taken no previous                        anticoagulant or antiplatelet agents. ASA Grade                        Assessment: II - A patient with mild systemic disease.                        After reviewing the risks and benefits, the patient was                        deemed in satisfactory condition to undergo the                        procedure.                       After obtaining informed consent, the colonoscope was  passed under direct vision. Throughout the procedure,                        the patient's blood pressure, pulse, and oxygen                        saturations were monitored continuously. The                        Colonoscope  was introduced through the anus and                        advanced to the the cecum, identified by appendiceal                        orifice and ileocecal valve. The colonoscopy was                        performed without difficulty. The patient tolerated the                        procedure well. The quality of the bowel preparation                        was excellent. Findings:      The perianal and digital rectal examinations were normal.      A 2 mm polyp was found in the cecum. The polyp was sessile. The polyp       was removed with a cold biopsy forceps. Resection and retrieval were       complete. Impression:           - One 2 mm polyp in the cecum, removed with a cold                        biopsy forceps. Resected and retrieved. Recommendation:       - Discharge patient to home.                       - Resume previous diet.                       - Continue present medications.                       - Await pathology results.                       - Repeat colonoscopy in 5 years for surveillance. Procedure Code(s):    --- Professional ---                       [phone], Colonoscopy, flexible; with biopsy, single or                        multiple Diagnosis Code(s):    --- Professional ---                       Z86.010, Personal history of colonic polyps                       D12.0, Benign neoplasm of cecum CPT copyright 2016 American  Medical Association. All rights reserved. The codes documented in this report are preliminary and upon coder review may  be revised to meet current compliance requirements. [name] MD, MD 12/06/2016 8:02:21 AM This report has been signed electronically. Number of Addenda: 0 Note Initiated On: 12/06/2016 7:15 AM Scope Withdrawal Time: 0 hours 7 minutes 0 seconds  Total Procedure Duration: 0 hours 9 minutes 10 seconds       [facility]

## 2016-12-07 ENCOUNTER — Encounter: Payer: Self-pay | Admitting: Gastroenterology

## 2016-12-07 LAB — SURGICAL PATHOLOGY

## 2016-12-14 DIAGNOSIS — E1129 Type 2 diabetes mellitus with other diabetic kidney complication: Secondary | ICD-10-CM | POA: Diagnosis not present

## 2016-12-14 DIAGNOSIS — R81 Glycosuria: Secondary | ICD-10-CM | POA: Diagnosis not present

## 2016-12-14 DIAGNOSIS — N183 Chronic kidney disease, stage 3 (moderate): Secondary | ICD-10-CM | POA: Diagnosis not present

## 2016-12-15 LAB — GLUCOSE (CC13)
CREATININE: 1.71
Glucose: 183

## 2016-12-29 ENCOUNTER — Encounter: Payer: Self-pay | Admitting: Family Medicine

## 2017-01-11 ENCOUNTER — Other Ambulatory Visit: Payer: Self-pay | Admitting: *Deleted

## 2017-01-11 DIAGNOSIS — E119 Type 2 diabetes mellitus without complications: Secondary | ICD-10-CM

## 2017-01-20 ENCOUNTER — Other Ambulatory Visit: Payer: Medicare Other

## 2017-03-22 ENCOUNTER — Other Ambulatory Visit (INDEPENDENT_AMBULATORY_CARE_PROVIDER_SITE_OTHER): Payer: Medicare Other

## 2017-03-22 DIAGNOSIS — E119 Type 2 diabetes mellitus without complications: Secondary | ICD-10-CM | POA: Diagnosis not present

## 2017-03-22 LAB — HEMOGLOBIN A1C: Hgb A1c MFr Bld: 8.8 % — ABNORMAL HIGH (ref 4.6–6.5)

## 2017-03-30 ENCOUNTER — Encounter: Payer: Self-pay | Admitting: Family Medicine

## 2017-03-30 ENCOUNTER — Ambulatory Visit (INDEPENDENT_AMBULATORY_CARE_PROVIDER_SITE_OTHER): Payer: Medicare Other | Admitting: Family Medicine

## 2017-03-30 VITALS — BP 122/82 | HR 82 | Temp 98.9°F | Wt 160.0 lb

## 2017-03-30 DIAGNOSIS — E119 Type 2 diabetes mellitus without complications: Secondary | ICD-10-CM

## 2017-03-30 DIAGNOSIS — G4700 Insomnia, unspecified: Secondary | ICD-10-CM | POA: Insufficient documentation

## 2017-03-30 DIAGNOSIS — K219 Gastro-esophageal reflux disease without esophagitis: Secondary | ICD-10-CM | POA: Diagnosis not present

## 2017-03-30 MED ORDER — TRAZODONE HCL 50 MG PO TABS: 25.0000 mg | ORAL_TABLET | Freq: Every evening | ORAL | 1 refills | Status: DC | PRN

## 2017-03-30 MED ORDER — GLIPIZIDE 5 MG PO TABS: 5.0000 mg | ORAL_TABLET | Freq: Every day | ORAL | 1 refills | Status: DC

## 2017-03-30 NOTE — Assessment & Plan Note (Signed)
Okay to use zantac BID.  Sleep changes and social upheaval/diet changes likely contribute.  D/w pt.  He agrees.

## 2017-03-30 NOTE — Assessment & Plan Note (Signed)
See above.  Per patient, "This was not an ideal situation."  His diet and exercise situation have clearly been affected.  D/w pt about options.  If sugar is trending down then continue as is off med.  If not, then start glipizide and update me as needed.  Recheck in about 3 months.  He agrees.  Okay for outpatient f/u.  >25 minutes spent in face to face time with patient, >50% spent in counselling or coordination of care.

## 2017-03-30 NOTE — Patient Instructions (Signed)
Try melatonin 5mg  at night and consider getting a white noise machine.   Then try benadryl at 25mg  if needed.  Then try trazodone 25-50mg  at night if needed.  Take care.  Glad to see you.  Recheck labs in about 3 months.   Update me as needed in the meantime.   Start glipizide if your sugar isn't coming down.   Take care.  Glad to see you.

## 2017-03-30 NOTE — Assessment & Plan Note (Signed)
Exacerbated by sig social stressors, d/w pt.   Try melatonin, then benadryl, then trazodone if needed.  See AVS.  He agrees.

## 2017-03-30 NOTE — Progress Notes (Signed)
Diabetes:  Using medications without difficulties: no meds.   Hypoglycemic episodes:no Hyperglycemic episodes: no Feet problems:no Blood Sugars averaging: 130-200 in the AMs.  Has been variable.   eye exam within last year: yes A1c up.  See below He was walking more and had gone to PT for her neck.   Flu shot to be done at [facility].  I'll defer to patient.   His mother in law was hospitalized for a week.  Then a distant family member was hospitalized with a head bleed and the died after 1 month of an inpatient stay.  Then he had to put his dog down.   Then his mother in law was hospitalized again and died.   Patient had to arrange 2 funerals recently.    I offered condolences.   His whole summer was consumed with the care for relatives and settling their estates, etc.   Per patient, "This was not an ideal situation."  His diet and exercise situation have clearly been affected.  D/w pt about options.   He is waking up at about 2 AM worried about all of the paperwork and work he has to do.  He is going back to [place] tomorrow to go to [place] to work on setting the estate.    He has had more GERD sx in the meantime, but better in the las few days.  He usually takes zantac daily, occ BID.  D/w pt about taking it BID prn.    Meds, vitals, and allergies reviewed.   ROS: Per HPI unless specifically indicated in ROS section   GEN: nad, alert and oriented HEENT: mucous membranes moist NECK: supple w/o LA CV: rrr. PULM: ctab, no inc wob ABD: soft, +bs EXT: no edema

## 2017-04-04 DIAGNOSIS — C61 Malignant neoplasm of prostate: Secondary | ICD-10-CM | POA: Diagnosis not present

## 2017-04-10 DIAGNOSIS — C61 Malignant neoplasm of prostate: Secondary | ICD-10-CM | POA: Diagnosis not present

## 2017-04-10 DIAGNOSIS — N5201 Erectile dysfunction due to arterial insufficiency: Secondary | ICD-10-CM | POA: Diagnosis not present

## 2017-04-10 DIAGNOSIS — N393 Stress incontinence (female) (male): Secondary | ICD-10-CM | POA: Diagnosis not present

## 2017-06-06 DIAGNOSIS — N183 Chronic kidney disease, stage 3 (moderate): Secondary | ICD-10-CM | POA: Diagnosis not present

## 2017-06-06 DIAGNOSIS — E1129 Type 2 diabetes mellitus with other diabetic kidney complication: Secondary | ICD-10-CM | POA: Diagnosis not present

## 2017-06-29 ENCOUNTER — Other Ambulatory Visit: Payer: Self-pay | Admitting: Family Medicine

## 2017-06-29 DIAGNOSIS — E119 Type 2 diabetes mellitus without complications: Secondary | ICD-10-CM

## 2017-06-30 ENCOUNTER — Other Ambulatory Visit: Payer: Medicare Other

## 2017-08-01 ENCOUNTER — Other Ambulatory Visit (INDEPENDENT_AMBULATORY_CARE_PROVIDER_SITE_OTHER): Payer: Medicare Other

## 2017-08-01 DIAGNOSIS — E119 Type 2 diabetes mellitus without complications: Secondary | ICD-10-CM

## 2017-08-01 LAB — LIPID PANEL
CHOL/HDL RATIO: 3
Cholesterol: 163 mg/dL (ref 0–200)
HDL: 53.4 mg/dL (ref >39.00)
LDL Cholesterol: 98 mg/dL (ref 0–99)
NonHDL: 109.23
Triglycerides: 54 mg/dL (ref 0.0–149.0)
VLDL: 10.8 mg/dL (ref 0.0–40.0)

## 2017-08-01 LAB — COMPREHENSIVE METABOLIC PANEL
ALK PHOS: 68 U/L (ref 39–117)
ALT: 15 U/L (ref 0–53)
AST: 16 U/L (ref 0–37)
Albumin: 4.7 g/dL (ref 3.5–5.2)
BILIRUBIN TOTAL: 0.9 mg/dL (ref 0.2–1.2)
BUN: 28 mg/dL — AB (ref 6–23)
CO2: 27 meq/L (ref 19–32)
Calcium: 10 mg/dL (ref 8.4–10.5)
Chloride: 102 mEq/L (ref 96–112)
Creatinine, Ser: 1.75 mg/dL — ABNORMAL HIGH (ref 0.40–1.50)
GFR: 41.01 mL/min — ABNORMAL LOW (ref >60.00)
Glucose, Bld: 165 mg/dL — ABNORMAL HIGH (ref 70–99)
Potassium: 4 mEq/L (ref 3.5–5.1)
SODIUM: 139 meq/L (ref 135–145)
TOTAL PROTEIN: 7.3 g/dL (ref 6.0–8.3)

## 2017-08-01 LAB — MICROALBUMIN / CREATININE URINE RATIO
CREATININE, U: 111.7 mg/dL
MICROALB UR: 1.8 mg/dL (ref 0.0–1.9)
MICROALB/CREAT RATIO: 1.7 mg/g (ref 0.0–30.0)

## 2017-08-01 LAB — HEMOGLOBIN A1C: HEMOGLOBIN A1C: 8 % — AB (ref 4.6–6.5)

## 2017-08-15 ENCOUNTER — Other Ambulatory Visit: Payer: Self-pay | Admitting: Family Medicine

## 2017-08-17 ENCOUNTER — Other Ambulatory Visit: Payer: Self-pay | Admitting: Family Medicine

## 2017-08-18 ENCOUNTER — Telehealth: Payer: Self-pay | Admitting: Family Medicine

## 2017-08-18 MED ORDER — ONETOUCH DELICA LANCETS 33G MISC: 1 refills | Status: DC

## 2017-08-18 NOTE — Telephone Encounter (Signed)
Copied from [place]. Topic: Quick Communication - See Telephone Encounter >> Aug 18, 2017  9:37 AM Boyd Kerbs wrote: CRM for notification. See Telephone encounter for:   [facility] LANCETS 83T MISC  need clarification on dosage,  on  frequency, this is for billing insurance   08/18/17.

## 2017-08-18 NOTE — Telephone Encounter (Signed)
Correction made

## 2017-08-21 ENCOUNTER — Telehealth: Payer: Self-pay | Admitting: Family Medicine

## 2017-08-21 NOTE — Telephone Encounter (Signed)
Copied from [place] [id number]. Topic: Quick Communication - Rx Refill/Question >> Aug 21, 2017 12:16 PM Wynetta Emery, [place] C wrote: Medication: One Touch Verio Meter - pt has a coupon for a free Meter. Pt is also at pharmacy at the moment.    Has the patient contacted their pharmacy?yes    (Agent: If no, request that the patient contact the pharmacy for the refill.)   Preferred Pharmacy (with phone number or street name): [id number] - [place], [place]: Please be advised that RX refills may take up to 3 business days. We ask that you follow-up with your pharmacy.

## 2017-08-22 ENCOUNTER — Other Ambulatory Visit: Payer: Self-pay | Admitting: *Deleted

## 2017-08-22 MED ORDER — ONETOUCH VERIO W/DEVICE KIT: 1.0000 | PACK | Freq: Every day | 0 refills | Status: DC

## 2017-10-02 DIAGNOSIS — N183 Chronic kidney disease, stage 3 (moderate): Secondary | ICD-10-CM | POA: Diagnosis not present

## 2017-10-02 DIAGNOSIS — E1129 Type 2 diabetes mellitus with other diabetic kidney complication: Secondary | ICD-10-CM | POA: Diagnosis not present

## 2017-12-15 ENCOUNTER — Other Ambulatory Visit: Payer: Self-pay | Admitting: Family Medicine

## 2018-01-01 ENCOUNTER — Ambulatory Visit: Payer: Medicare Other

## 2018-01-01 ENCOUNTER — Other Ambulatory Visit: Payer: Self-pay | Admitting: Family Medicine

## 2018-01-01 ENCOUNTER — Ambulatory Visit (INDEPENDENT_AMBULATORY_CARE_PROVIDER_SITE_OTHER): Payer: Medicare Other

## 2018-01-01 VITALS — BP 124/70 | HR 70 | Temp 98.4°F | Ht 65.25 in | Wt 163.0 lb

## 2018-01-01 DIAGNOSIS — E119 Type 2 diabetes mellitus without complications: Secondary | ICD-10-CM | POA: Diagnosis not present

## 2018-01-01 DIAGNOSIS — Z0000 Encounter for general adult medical examination without abnormal findings: Secondary | ICD-10-CM | POA: Diagnosis not present

## 2018-01-01 LAB — COMPREHENSIVE METABOLIC PANEL
ALBUMIN: 4.3 g/dL (ref 3.5–5.2)
ALK PHOS: 61 U/L (ref 39–117)
ALT: 13 U/L (ref 0–53)
AST: 15 U/L (ref 0–37)
BUN: 29 mg/dL — AB (ref 6–23)
CALCIUM: 9.4 mg/dL (ref 8.4–10.5)
CO2: 24 mEq/L (ref 19–32)
Chloride: 107 mEq/L (ref 96–112)
Creatinine, Ser: 1.88 mg/dL — ABNORMAL HIGH (ref 0.40–1.50)
GFR: 37.71 mL/min — AB (ref >60.00)
Glucose, Bld: 139 mg/dL — ABNORMAL HIGH (ref 70–99)
POTASSIUM: 4.5 meq/L (ref 3.5–5.1)
SODIUM: 139 meq/L (ref 135–145)
TOTAL PROTEIN: 6.8 g/dL (ref 6.0–8.3)
Total Bilirubin: 0.5 mg/dL (ref 0.2–1.2)

## 2018-01-01 LAB — LIPID PANEL
CHOLESTEROL: 164 mg/dL (ref 0–200)
HDL: 58.1 mg/dL (ref >39.00)
LDL Cholesterol: 94 mg/dL (ref 0–99)
NonHDL: 105.81
TRIGLYCERIDES: 58 mg/dL (ref 0.0–149.0)
Total CHOL/HDL Ratio: 3
VLDL: 11.6 mg/dL (ref 0.0–40.0)

## 2018-01-01 LAB — HEMOGLOBIN A1C: Hgb A1c MFr Bld: 6.8 % — ABNORMAL HIGH (ref 4.6–6.5)

## 2018-01-01 NOTE — Patient Instructions (Addendum)
Guy [name] , Thank you for taking time to come for your Medicare Wellness Visit. I appreciate your ongoing commitment to your health goals. Please review the following plan we discussed and let me know if I can assist you in the future.   These are the goals we discussed: Goals    . Increase physical activity     Starting 01/01/2018, I will continue to walk at least 2 miles 2 days per week and to do garden work as needed.        This is a list of the screening recommended for you and due dates:  Health Maintenance  Topic Date Due  . Complete foot exam   01/09/2018*  . Eye exam for diabetics  06/19/2018*  . Flu Shot  01/18/2018  . Hemoglobin A1C  07/04/2018  . Urine Protein Check  08/01/2018  . Tetanus Vaccine  06/23/2021  . Colon Cancer Screening  12/06/2021  .  Hepatitis C: One time screening is recommended by Center for Disease Control  (CDC) for  adults born from [birth year] through 1965.   Completed  . Pneumonia vaccines  Completed  *Topic was postponed. The date shown is not the original due date.       Preventive Care for Adults  A healthy lifestyle and preventive care can promote health and wellness. Preventive health guidelines for adults include the following key practices.  . A routine yearly physical is a good way to check with your health care provider about your health and preventive screening. It is a chance to share any concerns and updates on your health and to receive a thorough exam.  . Visit your dentist for a routine exam and preventive care every 6 months. Brush your teeth twice a day and floss once a day. Good oral hygiene prevents tooth decay and gum disease.  . The frequency of eye exams is based on your age, health, family medical history, use  of contact lenses, and other factors. Follow your health care provider's recommendations for frequency of eye exams.  . Eat a healthy diet. Foods like vegetables, fruits, whole grains, low-fat dairy products, and lean  protein foods contain the nutrients you need without too many calories. Decrease your intake of foods high in solid fats, added sugars, and salt. Eat the right amount of calories for you. Get information about a proper diet from your health care provider, if necessary.  . Regular physical exercise is one of the most important things you can do for your health. Most adults should get at least 150 minutes of moderate-intensity exercise (any activity that increases your heart rate and causes you to sweat) each week. In addition, most adults need muscle-strengthening exercises on 2 or more days a week.  Silver Sneakers may be a benefit available to you. To determine eligibility, you may visit the website: www.silversneakers.com or contact program at 410-176-2476 Mon-Fri between 8AM-8PM.   . Maintain a healthy weight. The body mass index (BMI) is a screening tool to identify possible weight problems. It provides an estimate of body fat based on height and weight. Your health care provider can find your BMI and can help you achieve or maintain a healthy weight.   For adults 20 years and older: ? A BMI below 18.5 is considered underweight. ? A BMI of 18.5 to 24.9 is normal. ? A BMI of 25 to 29.9 is considered overweight. ? A BMI of 30 and above is considered obese.   . Maintain normal blood  lipids and cholesterol levels by exercising and minimizing your intake of saturated fat. Eat a balanced diet with plenty of fruit and vegetables. Blood tests for lipids and cholesterol should begin at age 66 and be repeated every 5 years. If your lipid or cholesterol levels are high, you are over 50, or you are at high risk for heart disease, you may need your cholesterol levels checked more frequently. Ongoing high lipid and cholesterol levels should be treated with medicines if diet and exercise are not working.  . If you smoke, find out from your health care provider how to quit. If you do not use tobacco, please  do not start.  . If you choose to drink alcohol, please do not consume more than 2 drinks per day. One drink is considered to be 12 ounces (355 mL) of beer, 5 ounces (148 mL) of wine, or 1.5 ounces (44 mL) of liquor.  . If you are 72-81 years old, ask your health care provider if you should take aspirin to prevent strokes.  . Use sunscreen. Apply sunscreen liberally and repeatedly throughout the day. You should seek shade when your shadow is shorter than you. Protect yourself by wearing long sleeves, pants, a wide-brimmed hat, and sunglasses year round, whenever you are outdoors.  . Once a month, do a whole body skin exam, using a mirror to look at the skin on your back. Tell your health care provider of new moles, moles that have irregular borders, moles that are larger than a pencil eraser, or moles that have changed in shape or color.

## 2018-01-01 NOTE — Progress Notes (Signed)
PCP notes:   Health maintenance:  Foot exam - PCP please address at next appt Eye exam - addressed A1C - completed  Abnormal screenings:   Mini-Cog score: 19/20 MMSE - Mini Mental State Exam 01/01/2018 01/07/2016  Orientation to time 5 5  Orientation to Place 5 5  Registration 3 3  Attention/ Calculation 0 0  Recall 2 3  Recall-comments unable to recall 1 of 3 words -  Language- name 2 objects 0 0  Language- repeat 1 1  Language- follow 3 step command 3 3  Language- read & follow direction 0 0  Write a sentence 0 0  Copy design 0 0  Total score 19 20    Patient concerns:   None  Nurse concerns:  None  Next PCP appt:   01/09/18 @ 1045  I reviewed health advisor's note, was available for consultation on the day of service listed in this note, and agree with documentation and plan. Elsie Stain, MD.

## 2018-01-01 NOTE — Progress Notes (Signed)
Subjective:   Guy [name] is a 72 y.o. male who presents for Medicare Annual/Subsequent preventive examination.  Review of Systems:  N/A Cardiac Risk Factors include: advanced age (>8mn, >>20women);diabetes mellitus;dyslipidemia;hypertension;male gender     Objective:    Vitals: BP 124/70 (BP Location: Right Arm, Patient Position: Sitting, Cuff Size: Normal)   Pulse 70   Temp 98.4 F (36.9 C) (Oral)   Ht 5' 5.25" (1.657 m) Comment: no shoes  Wt 163 lb (73.9 kg)   SpO2 99%   BMI 26.92 kg/m   Body mass index is 26.92 kg/m.  Advanced Directives 01/01/2018 12/06/2016 01/07/2016 01/07/2016 05/06/2015 05/06/2015 04/30/2015  Does Patient Have a Medical Advance Directive? Yes Yes Yes Yes Yes - Yes  Type of Advance Directive [place] will [place] will Living will - Living will  Does patient want to make changes to medical advance directive? Yes (MAU/Ambulatory/Procedural Areas - Information given) - Yes - information given Yes - information given No - Patient declined - No - Patient declined  Copy of [place] Chart? No - copy requested No - copy requested - - Yes Yes No - copy requested  Would patient like information on creating a medical advance directive? - - - - - - -    Tobacco Social History   Tobacco Use  Smoking Status Never Smoker  Smokeless Tobacco Never Used     Counseling given: No   Clinical Intake:  Pre-visit preparation completed: Yes  Pain : No/denies pain Pain Score: 0-No pain     Nutritional Status: BMI 25 -29 Overweight Nutritional Risks: None Diabetes: Yes CBG done?: No Did pt. bring in CBG monitor from home?: No  How often do you need to have someone help you when you read instructions, pamphlets, or other written materials from your doctor or pharmacy?: 1 - Never What is the last grade  level you completed in school?: 12th grade + 2 yrs college  Interpreter Needed?: No  Comments: pt is a widower and lives alone Information entered by :: LPinson, LPN  Past Medical History:  Diagnosis Date  . Anxiety    not clinical  . Arthritis   . Cataract   . Diabetes mellitus type II    oral meds  . Elevated PSA   . GERD (gastroesophageal reflux disease)   . History of basal cell carcinoma excision    1990's--  moh's surg. of nose  . History of hiatal hernia   . Neck complaint    "stiffness, catches"-denies numbness, tingling of arms  . Prostate cancer ([place]  . Wears contact lenses    Past Surgical History:  Procedure Laterality Date  . COLONOSCOPY  06-01-2011  . COLONOSCOPY WITH PROPOFOL N/A 12/06/2016   Procedure: COLONOSCOPY WITH PROPOFOL;  Surgeon: WLucilla Lame MD;  [facility];  Service: Endoscopy;  Laterality: N/A;  . ESOPHAGOGASTRODUODENOSCOPY  07/29/05   [facility]  . LYMPHADENECTOMY N/A 05/06/2015   Procedure: LYMPH NODE DISSECTION;  Surgeon: TAlexis Frock MD;  Location: [facility];  Service: Urology;  Laterality: N/A;  . MOHS SURGERY  1990's   nose  . PROSTATE BIOPSY  2014  . PROSTATE BIOPSY N/A 03/06/2015   Procedure: SATURATION BIOPSY TRANSRECTAL ULTRASONIC PROSTATE (TUBP);  Surgeon: TAlexis Frock MD;  [facility]  Service: Urology;  Laterality: N/A;  . ROBOT ASSISTED LAPAROSCOPIC RADICAL PROSTATECTOMY N/A 05/06/2015  Procedure: ROBOTIC ASSISTED LAPAROSCOPIC RADICAL PROSTATECTOMY;  Surgeon: Alexis Frock, MD;  Location: [facility];  Service: Urology;  Laterality: N/A;  . TONSILLECTOMY  as a child  . TOTAL HIP ARTHROPLASTY Right 08/ 2000   Family History  Problem Relation Age of Onset  . COPD Mother        smoker  . Cancer Mother        unknown primary  . Alzheimer's disease Father   . Hypertension Father   . Diabetes Father   . Obesity Brother   . Hypertension Brother   . Diabetes Brother   . Heart disease  Brother        heart valve  . Prostate cancer Neg Hx   . Colon cancer Neg Hx    Social History   Socioeconomic History  . Marital status: Widowed    Spouse name: Not on file  . Number of children: 1  . Years of education: Not on file  . Highest education level: Not on file  Occupational History  . 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 Financial resource strain: Not on file  . Food insecurity:    Worry: Not on file    Inability: Not on file  . Transportation needs:    Medical: Not on file    Non-medical: Not on file  Tobacco Use  . Smoking status: Never Smoker  . Smokeless tobacco: Never Used  Substance and Sexual Activity  . Alcohol use: Yes    Alcohol/week: 0.0 oz    Comment: rare  . Drug use: No  . Sexual activity: Not Currently  Lifestyle  . Physical activity:    Days per week: Not on file    Minutes per session: Not on file  . Stress: Not on file  Relationships  . Social connections:    Talks on phone: Not on file    Gets together: Not on file    Attends religious service: Not on file    Active member of club or organization: Not on file    Attends meetings of clubs or organizations: Not on file    Relationship status: Not on file  Other Topics Concern  . Not on file  Social History Narrative   [employer] 2 years, E5 ([place] '69-71)   Married 1980, widowed 2014   1 step daughter    Elaina Hoops   Retired from [employer] Encounter Medications as of 01/01/2018  Medication Sig  . acetaminophen (TYLENOL) 500 MG tablet Take 500 mg by mouth 2 (two) times daily.   Marland Kitchen amoxicillin (AMOXIL) 500 MG capsule Take 4 capsules (2,000 mg total) by mouth daily. Before dental work.  . Blood Glucose Monitoring Suppl (ONETOUCH VERIO) w/Device KIT 1 kit by Other route daily. Use as instructed to check blood sugar once daily and as needed.  Diagnosis:  E11.9  Non insulin dependent.  Marland Kitchen glipiZIDE (GLUCOTROL) 5 MG tablet  TAKE ONE TABLET BY MOUTH EVERY MORNING BEFORE BREAKFAST  . ONETOUCH DELICA LANCETS 99B MISC Use to check blood sugar daily  . ONETOUCH VERIO test strip CHECK BLOOD GLUCOSE ONCE DAILY AS DIRECTED  . ranitidine (ZANTAC) 150 MG tablet Take 1 tablet (150 mg total) by mouth 2 (two) times daily.  . traZODone (DESYREL) 50 MG tablet Take 0.5-1 tablets (25-50 mg total) by mouth at bedtime as needed for sleep. (Patient not taking: Reported on 01/01/2018)   No facility-administered encounter  medications on file as of 01/01/2018.     Activities of Daily Living In your present state of health, do you have any difficulty performing the following activities: 01/01/2018  Hearing? N  Vision? N  Difficulty concentrating or making decisions? N  Walking or climbing stairs? N  Dressing or bathing? N  Doing errands, shopping? N  Preparing Food and eating ? N  Using the Toilet? N  In the past six months, have you accidently leaked urine? N  Do you have problems with loss of bowel control? N  Managing your Medications? N  Managing your Finances? N  Housekeeping or managing your Housekeeping? N  Some recent data might be hidden    Patient Care Team: Tonia Ghent, MD as PCP - General (Family Medicine) Romine, Richard Miu, DO as Referring Physician (Optometry) Alexis Frock, MD as Consulting Physician (Urology)   Assessment:   This is a routine wellness examination for Manuelito.   Hearing Screening   '125Hz'$  '250Hz'$  '500Hz'$  '1000Hz'$  '2000Hz'$  '3000Hz'$  '4000Hz'$  '6000Hz'$  '8000Hz'$   Right ear:   40 40 40  40    Left ear:   40 40 40  40      Visual Acuity Screening   Right eye Left eye Both eyes  Without correction: 20/50 20/50 20/25-1  With correction:        Exercise Activities and Dietary recommendations Current Exercise Habits: Home exercise routine, Type of exercise: walking, Time (Minutes): 60, Frequency (Times/Week): 2, Weekly Exercise (Minutes/Week): 120, Intensity: Moderate, Exercise limited by: orthopedic  condition(s)  Goals    . Increase physical activity     Starting 01/01/2018, I will continue to walk at least 2 miles 2 days per week and to do garden work as needed.        Fall Risk Fall Risk  01/01/2018 01/07/2016 01/07/2016 01/13/2015 01/08/2014  Falls in the past year? No No No No No  Number falls in past yr: - - - - -  Injury with Fall? - - - - -   Depression Screen PHQ 2/9 Scores 01/01/2018 01/07/2016 01/07/2016 01/13/2015  PHQ - 2 Score 0 0 0 0  PHQ- 9 Score 0 - - -    Cognitive Function MMSE - Mini Mental State Exam 01/01/2018 01/07/2016  Orientation to time 5 5  Orientation to Place 5 5  Registration 3 3  Attention/ Calculation 0 0  Recall 2 3  Recall-comments unable to recall 1 of 3 words -  Language- name 2 objects 0 0  Language- repeat 1 1  Language- follow 3 step command 3 3  Language- read & follow direction 0 0  Write a sentence 0 0  Copy design 0 0  Total score 19 20     PLEASE NOTE: A Mini-Cog screen was completed. Maximum score is 20. A value of 0 denotes this part of Folstein MMSE was not completed or the patient failed this part of the Mini-Cog screening.   Mini-Cog Screening Orientation to Time - Max 5 pts Orientation to Place - Max 5 pts Registration - Max 3 pts Recall - Max 3 pts Language Repeat - Max 1 pts Language Follow 3 Step Command - Max 3 pts     Immunization History  Administered Date(s) Administered  . Influenza Split 06/24/2011  . Influenza Whole 05/09/2007, 04/27/2009  . Influenza, High Dose Seasonal PF 07/10/2017  . Influenza, Seasonal, Injecte, Preservative Fre 06/25/2012  . Influenza-Unspecified 06/24/2014, 03/21/2015, 06/16/2016  . Pneumococcal Conjugate-13 09/09/2014  . Pneumococcal Polysaccharide-23  05/20/1997, 06/24/2011  . Td 03/12/2001, 06/24/2011  . Zoster 07/24/2006   Screening Tests Health Maintenance  Topic Date Due  . FOOT EXAM  01/09/2018 (Originally 10/20/2017)  . OPHTHALMOLOGY EXAM  06/19/2018 (Originally  07/05/2017)  . INFLUENZA VACCINE  01/18/2018  . HEMOGLOBIN A1C  07/04/2018  . URINE MICROALBUMIN  08/01/2018  . TETANUS/TDAP  06/23/2021  . COLONOSCOPY  12/06/2021  . Hepatitis C Screening  Completed  . PNA vac Low Risk Adult  Completed     Plan:     I have personally reviewed, addressed, and noted the following in the patient's chart:  A. Medical and social history B. Use of alcohol, tobacco or illicit drugs  C. Current medications and supplements D. Functional ability and status E.  Nutritional status F.  Physical activity G. Advance directives H. List of other physicians I.  Hospitalizations, surgeries, and ER visits in previous 12 months J.  [place] to include hearing, vision, cognitive, depression L. Referrals and appointments - none  In addition, I have reviewed and discussed with patient certain preventive protocols, quality metrics, and best practice recommendations. A written personalized care plan for preventive services as well as general preventive health recommendations were provided to patient.  See attached scanned questionnaire for additional information.   Signed,   Lindell Noe, MHA, BS, LPN Health Coach

## 2018-01-09 ENCOUNTER — Encounter: Payer: Self-pay | Admitting: Family Medicine

## 2018-01-09 ENCOUNTER — Ambulatory Visit (INDEPENDENT_AMBULATORY_CARE_PROVIDER_SITE_OTHER): Payer: Medicare Other | Admitting: Family Medicine

## 2018-01-09 ENCOUNTER — Ambulatory Visit (INDEPENDENT_AMBULATORY_CARE_PROVIDER_SITE_OTHER)
Admission: RE | Admit: 2018-01-09 | Discharge: 2018-01-09 | Disposition: A | Discharge status: Home / Self Care | Payer: Medicare Other | Source: Ambulatory Visit | Attending: Family Medicine | Admitting: Family Medicine

## 2018-01-09 VITALS — BP 124/70 | HR 70 | Temp 98.4°F | Ht 65.25 in | Wt 163.0 lb

## 2018-01-09 DIAGNOSIS — N189 Chronic kidney disease, unspecified: Secondary | ICD-10-CM | POA: Diagnosis not present

## 2018-01-09 DIAGNOSIS — Z7189 Other specified counseling: Secondary | ICD-10-CM

## 2018-01-09 DIAGNOSIS — Z0000 Encounter for general adult medical examination without abnormal findings: Secondary | ICD-10-CM

## 2018-01-09 DIAGNOSIS — E119 Type 2 diabetes mellitus without complications: Secondary | ICD-10-CM

## 2018-01-09 DIAGNOSIS — E785 Hyperlipidemia, unspecified: Secondary | ICD-10-CM | POA: Diagnosis not present

## 2018-01-09 DIAGNOSIS — M545 Low back pain: Secondary | ICD-10-CM | POA: Diagnosis not present

## 2018-01-09 DIAGNOSIS — R1011 Right upper quadrant pain: Secondary | ICD-10-CM

## 2018-01-09 DIAGNOSIS — C61 Malignant neoplasm of prostate: Secondary | ICD-10-CM

## 2018-01-09 MED ORDER — AMOXICILLIN 500 MG PO CAPS: 2000.0000 mg | ORAL_CAPSULE | Freq: Every day | ORAL | 1 refills | Status: DC
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
## 2018-01-09 NOTE — Patient Instructions (Addendum)
We will call about your referral.  Rosaria Ferries or Azalee Course will call you if you don't see one of them on the way out.  Go to the lab on the way out.  We'll contact you with your xray report. Take care.  Glad to see you.  Recheck in about 3 months, labs at the visit.  The only lab you need to have done for your next diabetic visit is an A1c.  We can do this with a fingerstick test at the office visit.  You do not need a lab visit ahead of time for this.  It does not matter if you are fasting when the lab is done.

## 2018-01-11 DIAGNOSIS — M549 Dorsalgia, unspecified: Secondary | ICD-10-CM | POA: Insufficient documentation

## 2018-01-11 DIAGNOSIS — R1011 Right upper quadrant pain: Secondary | ICD-10-CM | POA: Insufficient documentation

## 2018-01-11 DIAGNOSIS — Z0000 Encounter for general adult medical examination without abnormal findings: Secondary | ICD-10-CM | POA: Insufficient documentation

## 2018-01-11 DIAGNOSIS — N189 Chronic kidney disease, unspecified: Secondary | ICD-10-CM | POA: Insufficient documentation

## 2018-01-11 NOTE — Assessment & Plan Note (Signed)
Fortunately without back pain at time of exam and straight leg raise is negative bilaterally.  Distally neurovascular intact.  Still okay for outpatient follow-up.  Reasonable to check plain films of his back.  See notes on imaging.

## 2018-01-11 NOTE — Assessment & Plan Note (Signed)
Benign abdominal exam now.  Reasonable to check right upper quadrant ultrasound.  Rationale discussed with patient.  He agrees. Routine cautions given.

## 2018-01-11 NOTE — Assessment & Plan Note (Signed)
Living will d/w pt. Would have Guy [name] designated if patient were incapacitated.  

## 2018-01-11 NOTE — Assessment & Plan Note (Signed)
Statin intolerant.  Continue work on diet and exercise.  Labs discussed with patient. 

## 2018-01-11 NOTE — Assessment & Plan Note (Signed)
Per urology.  I'll defer.  He agrees.  

## 2018-01-11 NOTE — Assessment & Plan Note (Signed)
A1c is reasonable.  No change in meds.  Recheck in few months.  Continue work on diet and exercise.  He agrees.

## 2018-01-11 NOTE — Assessment & Plan Note (Signed)
Flu prev done Shingles 2008 PNA prev done. Tetanus 2013 Colonoscopy 2018 Prostate cancer per uro.  He has f/u pending.   Living will d/w pt.  Would have [name] designated if patient were incapacitated.   Normal recall today.  D/w pt.   He uses amoxil prior to dental visits.

## 2018-01-11 NOTE — Assessment & Plan Note (Signed)
Per renal clinic.  He is avoiding NSAIDs.  Routine cautions given.

## 2018-01-12 ENCOUNTER — Ambulatory Visit
Admission: RE | Admit: 2018-01-12 | Discharge: 2018-01-12 | Disposition: A | Discharge status: Home / Self Care | Payer: Medicare Other | Source: Ambulatory Visit | Attending: Family Medicine | Admitting: Family Medicine

## 2018-01-12 DIAGNOSIS — R1011 Right upper quadrant pain: Secondary | ICD-10-CM | POA: Diagnosis not present

## 2018-01-12 DIAGNOSIS — K8020 Calculus of gallbladder without cholecystitis without obstruction: Secondary | ICD-10-CM | POA: Insufficient documentation

## 2018-01-14 ENCOUNTER — Other Ambulatory Visit: Payer: Self-pay | Admitting: Family Medicine

## 2018-01-14 DIAGNOSIS — R1011 Right upper quadrant pain: Secondary | ICD-10-CM

## 2018-01-22 ENCOUNTER — Other Ambulatory Visit: Payer: Self-pay

## 2018-01-23 ENCOUNTER — Other Ambulatory Visit: Payer: Self-pay | Admitting: Family Medicine

## 2018-01-24 DIAGNOSIS — I10 Essential (primary) hypertension: Secondary | ICD-10-CM | POA: Diagnosis not present

## 2018-01-24 DIAGNOSIS — N183 Chronic kidney disease, stage 3 (moderate): Secondary | ICD-10-CM | POA: Diagnosis not present

## 2018-01-24 DIAGNOSIS — E1129 Type 2 diabetes mellitus with other diabetic kidney complication: Secondary | ICD-10-CM | POA: Diagnosis not present

## 2018-01-25 ENCOUNTER — Encounter: Payer: Self-pay | Admitting: Surgery

## 2018-01-25 ENCOUNTER — Ambulatory Visit (INDEPENDENT_AMBULATORY_CARE_PROVIDER_SITE_OTHER): Payer: Medicare Other | Admitting: Surgery

## 2018-01-25 VITALS — BP 138/89 | HR 77 | Temp 97.9°F | Ht 65.25 in | Wt 165.6 lb

## 2018-01-25 DIAGNOSIS — K8020 Calculus of gallbladder without cholecystitis without obstruction: Secondary | ICD-10-CM

## 2018-01-25 NOTE — H&P (View-Only) (Signed)
Surgical Clinic History and Physical  Referring provider:  Tonia Ghent, MD [place], [place] [address]  HISTORY OF PRESENT ILLNESS (HPI):  72 y.o. male presents for evaluation of abdominal pain. Patient reports he first noticed mild intermittent RUQ abdominal pain 2 years ago, that he describes was at that time mild. Over the past 6 - 8 months, however, his pain has worsened and become more frequent. He has noticed that his pain occurs after eating fatty foods in particular and improved when he avoids fatty foods. Patient otherwise denies fever/chills or N/V and denies CP or SOB with walking >2 blocks or up/down a flight of steps.  PAST MEDICAL HISTORY (PMH):  Past Medical History:  Diagnosis Date  . Anxiety    not clinical  . Arthritis   . Cataract   . Diabetes mellitus type II    oral meds  . Elevated PSA   . GERD (gastroesophageal reflux disease)   . History of basal cell carcinoma excision    1990's--  moh's surg. of nose  . History of hiatal hernia   . Neck complaint    "stiffness, catches"-denies numbness, tingling of arms  . Prostate cancer ([place]) 2016  . Wears contact lenses      PAST SURGICAL HISTORY ([place]):  Past Surgical History:  Procedure Laterality Date  . COLONOSCOPY  06-01-2011  . COLONOSCOPY WITH PROPOFOL N/A 12/06/2016   Procedure: COLONOSCOPY WITH PROPOFOL;  Surgeon: [name], MD;  Location: [facility] ENDOSCOPY;  Service: Endoscopy;  Laterality: N/A;  . ESOPHAGOGASTRODUODENOSCOPY  07/29/05   [facility]  . LYMPHADENECTOMY N/A 05/06/2015   Procedure: LYMPH NODE DISSECTION;  Surgeon: Alexis Frock, MD;  Location: [facility] ORS;  Service: Urology;  Laterality: N/A;  . MOHS SURGERY  1990's   nose  . PROSTATE BIOPSY  2014  . PROSTATE BIOPSY N/A 03/06/2015   Procedure: SATURATION BIOPSY TRANSRECTAL ULTRASONIC PROSTATE (TUBP);  Surgeon: Alexis Frock, MD;  Location[facility];  Service: Urology;  Laterality: N/A;  . ROBOT  ASSISTED LAPAROSCOPIC RADICAL PROSTATECTOMY N/A 05/06/2015   Procedure: ROBOTIC ASSISTED LAPAROSCOPIC RADICAL PROSTATECTOMY;  Surgeon: Alexis Frock, MD;  Location: [facility] ORS;  Service: Urology;  Laterality: N/A;  . TONSILLECTOMY  as a child  . TOTAL HIP ARTHROPLASTY Right 08/ 2000     MEDICATIONS:  Prior to Admission medications   Medication Sig Start Date End Date Taking? Authorizing Provider  acetaminophen (TYLENOL) 500 MG tablet Take 500 mg by mouth 2 (two) times daily.    Yes [provider]  amoxicillin (AMOXIL) 500 MG capsule Take 4 capsules (2,000 mg total) by mouth daily. Before dental work. 01/09/18  Yes Tonia Ghent, MD  Blood Glucose Monitoring Suppl (ONETOUCH VERIO) w/Device KIT 1 kit by Other route daily. Use as instructed to check blood sugar once daily and as needed.  Diagnosis:  E11.9  Non insulin dependent. 08/22/17  Yes Tonia Ghent, MD  glipiZIDE (GLUCOTROL) 5 MG tablet TAKE ONE TABLET BY MOUTH EVERY MORNING BEFORE BREAKFAST 12/15/17  Yes Tonia Ghent, MD  [facility] DELICA LANCETS 94W MISC Use to check blood sugar daily 08/18/17  Yes Tonia Ghent, MD  [facility] VERIO test strip CHECK BLOOD GLUCOSE ONCE DAILY AS DIRECTED 01/24/18  Yes Tonia Ghent, MD  ranitidine (ZANTAC) 150 MG tablet Take 1 tablet (150 mg total) by mouth 2 (two) times daily. 10/20/16  Yes Tonia Ghent, MD     ALLERGIES:  Allergies  Allergen Reactions  .  Nsaids Other (See Comments)    Avoid due to h/o renal disease  . Pravastatin Other (See Comments)    Aches at '40mg'$  dosing.      SOCIAL HISTORY:  Social History   Socioeconomic History  . Marital status: Widowed    Spouse name: Not on file  . Number of children: 1  . Years of education: Not on file  . Highest education level: Not on file  Occupational History  . 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 Financial resource strain: Not on file  . Food insecurity:     Worry: Not on file    Inability: Not on file  . Transportation needs:    Medical: Not on file    Non-medical: Not on file  Tobacco Use  . Smoking status: Never Smoker  . Smokeless tobacco: Never Used  Substance and Sexual Activity  . Alcohol use: Yes    Alcohol/week: 0.0 standard drinks    Comment: rare  . Drug use: No  . Sexual activity: Not Currently  Lifestyle  . Physical activity:    Days per week: Not on file    Minutes per session: Not on file  . Stress: Not on file  Relationships  . Social connections:    Talks on phone: Not on file    Gets together: Not on file    Attends religious service: Not on file    Active member of club or organization: Not on file    Attends meetings of clubs or organizations: Not on file    Relationship status: Not on file  . Intimate partner violence:    Fear of current or ex partner: Not on file    Emotionally abused: Not on file    Physically abused: Not on file    Forced sexual activity: Not on file  Other Topics Concern  . Not on file  Social History Narrative   [employer] 2 years, E5 ([place] '69-71)   Married 1980, widowed 2014   1 step daughter    Tourist information centre manager   Retired from [employer]    The patient currently resides (home / rehab facility / nursing home): Home The patient normally is (ambulatory / bedbound): Ambulatory  FAMILY HISTORY:  Family History  Problem Relation Age of Onset  . COPD Mother        smoker  . Cancer Mother        unknown primary  . Alzheimer's disease Father   . Hypertension Father   . Diabetes Father   . Obesity Brother   . Hypertension Brother   . Diabetes Brother   . Heart disease Brother        heart valve  . Prostate cancer Neg Hx   . Colon cancer Neg Hx     Otherwise negative/non-contributory.  REVIEW OF SYSTEMS:  Constitutional: denies any other weight loss, fever, chills, or sweats  Eyes: denies any other vision changes, history of eye injury  ENT: denies sore throat, hearing problems   Respiratory: denies shortness of breath, wheezing  Cardiovascular: denies chest pain, palpitations  Gastrointestinal: abdominal pain, N/V, and bowel function as per HPI Musculoskeletal: denies any other joint pains or cramps  Skin: Denies any other rashes or skin discolorations Neurological: denies any other headache, dizziness, weakness  Psychiatric: Denies any other depression, anxiety   All other review of systems were otherwise negative   VITAL SIGNS:  BP 138/89   Pulse  77   Temp 97.9 F (36.6 C) (Oral)   Ht 5' 5.25" (1.657 m)   Wt 165 lb 9.6 oz (75.1 kg)   BMI 27.35 kg/m    PHYSICAL EXAM:  Constitutional:  -- Normal body habitus  -- Awake, alert, and oriented x3  Eyes:  -- Pupils equally round and reactive to light  -- No scleral icterus  Ear, nose, throat:  -- No jugular venous distension -- No nasal drainage, bleeding Pulmonary:  -- No crackles  -- Equal breath sounds bilaterally -- Breathing non-labored at rest Cardiovascular:  -- S1, S2 present  -- No pericardial rubs  Gastrointestinal:  -- Abdomen soft, nontender, non-distended, no guarding/rebound  -- No abdominal masses appreciated, pulsatile or otherwise  Musculoskeletal and Integumentary:  -- Wounds or skin discoloration: None appreciated -- Extremities: B/L UE and LE FROM, hands and feet warm, no edema  Neurologic:  -- Motor function: Intact and symmetric -- Sensation: Intact and symmetric  Labs:  CBC Latest Ref Rng & Units 05/07/2015 05/06/2015 04/30/2015  WBC 4.0 - 10.5 K/uL - - 6.7  Hemoglobin 13.0 - 17.0 g/dL 12.1(L) 12.8(L) 14.6  Hematocrit 39.0 - 52.0 % 35.7(L) 37.4(L) 42.9  Platelets 150 - 400 K/uL - - 304   CMP Latest Ref Rng & Units 01/01/2018 08/01/2017 12/14/2016  Glucose 70 - 99 mg/dL 139(H) 165(H) -  BUN 6 - 23 mg/dL 29(H) 28(H) -  Creatinine 0.40 - 1.50 mg/dL 1.88(H) 1.75(H) 1.71  Sodium 135 - 145 mEq/L 139 139 -  Potassium 3.5 - 5.1 mEq/L 4.5 4.0 -  Chloride 96 - 112 mEq/L 107  102 -  CO2 19 - 32 mEq/L 24 27 -  Calcium 8.4 - 10.5 mg/dL 9.4 10.0 -  Total Protein 6.0 - 8.3 g/dL 6.8 7.3 -  Total Bilirubin 0.2 - 1.2 mg/dL 0.5 0.9 -  Alkaline Phos 39 - 117 U/L 61 68 -  AST 0 - 37 U/L 15 16 -  ALT 0 - 53 U/L 13 15 -    Imaging studies:  Limited RUQ Abdominal Ultrasound (01/12/2018) Well distended with evidence of cholelithiasis. No wall thickening or pericholecystic fluid is noted. Common bile duct diameter: 4.4 mm.  Assessment/Plan:  72 y.o. male with increasingly symptomatic (painful) cholelithiasis, complicated by co-morbidities including DM, CKD, GERD, osteoarthritis, and a history significant for prostate cancer.              - avoid/minimize foods with higher fat content (meats, cheeses/dairy, and fried)             - prefer low-fat vegetables, whole grains (wheat bread, ceareals, etc), and fruits until cholecystectomy              - all risks, benefits, and alternatives to cholecystectomy were discussed with the patient, all of his questions were answered to his expressed satisfaction, patient expresses he wishes to proceed, and informed consent was obtained.             - will plan for laparoscopic cholecystectomy on 8/30 pending anesthesia and OR availability             - anticipate return to clinic 2 weeks after above planned surgery             - instructed to call if any questions or concerns  All of the above recommendations were discussed with the patient, and all of patient's questions were answered to his expressed satisfaction.  Thank you for the opportunity to participate in this  patient's care.  -- Marilynne Drivers Rosana Hoes, MD, [place]: [facility] - Partnering for exceptional care. Office: [phone]

## 2018-01-25 NOTE — Patient Instructions (Signed)
You have requested to have your gallbladder removed. This will be done at [facility] with Dr.Davis.  You will most likely be out of work 1-2 weeks for this surgery. You will return after your post-op appointment with a lifting restriction for approximately 4 more weeks.  You will be able to eat anything you would like to following surgery. But, start by eating a bland diet and advance this as tolerated. The Gallbladder diet is below, please go as closely by this diet as possible prior to surgery to avoid any further attacks.  Please see the (blue)pre-care form that you have been given today. If you have any questions, please call our office.  Laparoscopic Cholecystectomy Laparoscopic cholecystectomy is surgery to remove the gallbladder. The gallbladder is located in the upper right part of the abdomen, behind the liver. It is a storage sac for bile, which is produced in the liver. Bile aids in the digestion and absorption of fats. Cholecystectomy is often done for inflammation of the gallbladder (cholecystitis). This condition is usually caused by a buildup of gallstones (cholelithiasis) in the gallbladder. Gallstones can block the flow of bile, and that can result in inflammation and pain. In severe cases, emergency surgery may be required. If emergency surgery is not required, you will have time to prepare for the procedure. Laparoscopic surgery is an alternative to open surgery. Laparoscopic surgery has a shorter recovery time. Your common bile duct may also need to be examined during the procedure. If stones are found in the common bile duct, they may be removed. LET [facility] CARE PROVIDER KNOW ABOUT:  Any allergies you have.  All medicines you are taking, including vitamins, herbs, eye drops, creams, and over-the-counter medicines.  Previous problems you or members of your family have had with the use of anesthetics.  Any blood disorders you have.  Previous surgeries you have  had.    Any medical conditions you have. RISKS AND COMPLICATIONS Generally, this is a safe procedure. However, problems may occur, including:  Infection.  Bleeding.  Allergic reactions to medicines.  Damage to other structures or organs.  A stone remaining in the common bile duct.  A bile leak from the cyst duct that is clipped when your gallbladder is removed.  The need to convert to open surgery, which requires a larger incision in the abdomen. This may be necessary if your surgeon thinks that it is not safe to continue with a laparoscopic procedure. BEFORE THE PROCEDURE  Ask your health care provider about:  Changing or stopping your regular medicines. This is especially important if you are taking diabetes medicines or blood thinners.  Taking medicines such as aspirin and ibuprofen. These medicines can thin your blood. Do not take these medicines before your procedure if your health care provider instructs you not to.  Follow instructions from your health care provider about eating or drinking restrictions.  Let your health care provider know if you develop a cold or an infection before surgery.  Plan to have someone take you home after the procedure.  Ask your health care provider how your surgical site will be marked or identified.  You may be given antibiotic medicine to help prevent infection. PROCEDURE  To reduce your risk of infection:  Your health care team will wash or sanitize their hands.  Your skin will be washed with soap.  An IV tube may be inserted into one of your veins.  You will be given a medicine to make you fall asleep (  general anesthetic).  A breathing tube will be placed in your mouth.  The surgeon will make several small cuts (incisions) in your abdomen.  A thin, lighted tube (laparoscope) that has a tiny camera on the end will be inserted through one of the small incisions. The camera on the laparoscope will send a picture to a TV  screen (monitor) in the operating room. This will give the surgeon a good view inside your abdomen.  A gas will be pumped into your abdomen. This will expand your abdomen to give the surgeon more room to perform the surgery.  Other tools that are needed for the procedure will be inserted through the other incisions. The gallbladder will be removed through one of the incisions.  After your gallbladder has been removed, the incisions will be closed with stitches (sutures), staples, or skin glue.  Your incisions may be covered with a bandage (dressing). The procedure may vary among health care providers and hospitals. AFTER THE PROCEDURE  Your blood pressure, heart rate, breathing rate, and blood oxygen level will be monitored often until the medicines you were given have worn off.  You will be given medicines as needed to control your pain.   This information is not intended to replace advice given to you by your health care provider. Make sure you discuss any questions you have with your health care provider.   Document Released: 06/06/2005 Document Revised: 02/25/2015 Document Reviewed: 01/16/2013 Elsevier Interactive Patient Education 2016 [place] Diet for Gallbladder Conditions A low-fat diet can be helpful if you have pancreatitis or a gallbladder condition. With these conditions, your pancreas and gallbladder have trouble digesting fats. A healthy eating plan with less fat will help rest your pancreas and gallbladder and reduce your symptoms. WHAT DO I NEED TO KNOW ABOUT THIS DIET?  Eat a low-fat diet.  Reduce your fat intake to less than 20-30% of your total daily calories. This is less than 50-60 g of fat per day.  Remember that you need some fat in your diet. Ask your dietician what your daily goal should be.  Choose nonfat and low-fat healthy foods. Look for the words "nonfat," "low fat," or "fat free."  As a guide, look on the label and choose foods with  less than 3 g of fat per serving. Eat only one serving.  Avoid alcohol.  Do not smoke. If you need help quitting, talk with your health care provider.  Eat small frequent meals instead of three large heavy meals. WHAT FOODS CAN I EAT? Grains Include healthy grains and starches such as potatoes, wheat bread, fiber-rich cereal, and brown rice. Choose whole grain options whenever possible. In adults, whole grains should account for 45-65% of your daily calories.  Fruits and Vegetables Eat plenty of fruits and vegetables. Fresh fruits and vegetables add fiber to your diet. Meats and Other Protein Sources Eat lean meat such as chicken and pork. Trim any fat off of meat before cooking it. Eggs, fish, and beans are other sources of protein. In adults, these foods should account for 10-35% of your daily calories. Dairy Choose low-fat milk and dairy options. Dairy includes fat and protein, as well as calcium.  Fats and Oils Limit high-fat foods such as fried foods, sweets, baked goods, sugary drinks.  Other Creamy sauces and condiments, such as mayonnaise, can add extra fat. Think about whether or not you need to use them, or use smaller amounts or low fat options. WHAT FOODS ARE  NOT RECOMMENDED?  High fat foods, such as:  [employer].  Ice cream.  [place] toast.  Sweet rolls.  Pizza.  Cheese bread.  Foods covered with batter, butter, creamy sauces, or cheese.  Fried foods.  Sugary drinks and desserts.  Foods that cause gas or bloating   This information is not intended to replace advice given to you by your health care provider. Make sure you discuss any questions you have with your health care provider.   Document Released: 06/11/2013 Document Reviewed: 06/11/2013 Elsevier Interactive Patient Education [employer].

## 2018-01-25 NOTE — Progress Notes (Signed)
Surgical Clinic History and Physical  Referring provider:  Tonia Ghent, MD [name], [name] [id number]  HISTORY OF PRESENT ILLNESS (HPI):  72 y.o. male presents for evaluation of abdominal pain. Patient reports he first noticed mild intermittent RUQ abdominal pain 2 years ago, that he describes was at that time mild. Over the past 6 - 8 months, however, his pain has worsened and become more frequent. He has noticed that his pain occurs after eating fatty foods in particular and improved when he avoids fatty foods. Patient otherwise denies fever/chills or N/V and denies CP or SOB with walking >2 blocks or up/down a flight of steps.  PAST MEDICAL HISTORY (PMH):  Past Medical History:  Diagnosis Date  . Anxiety    not clinical  . Arthritis   . Cataract   . Diabetes mellitus type II    oral meds  . Elevated PSA   . GERD (gastroesophageal reflux disease)   . History of basal cell carcinoma excision    1990's--  moh's surg. of nose  . History of hiatal hernia   . Neck complaint    "stiffness, catches"-denies numbness, tingling of arms  . Prostate cancer ([name]) 2016  . Wears contact lenses      PAST SURGICAL HISTORY ([place]):  Past Surgical History:  Procedure Laterality Date  . COLONOSCOPY  06-01-2011  . COLONOSCOPY WITH PROPOFOL N/A 12/06/2016   Procedure: COLONOSCOPY WITH PROPOFOL;  Surgeon: Guy Lame, MD;  Location: [name];  Service: Endoscopy;  Laterality: N/A;  . ESOPHAGOGASTRODUODENOSCOPY  07/29/05   [facility].  . LYMPHADENECTOMY N/A 05/06/2015   Procedure: LYMPH NODE DISSECTION;  Surgeon: [place] Frock, MD;  Location: [name];  Service: Urology;  Laterality: N/A;  . MOHS SURGERY  1990's   nose  . PROSTATE BIOPSY  2014  . PROSTATE BIOPSY N/A 03/06/2015   Procedure: SATURATION BIOPSY TRANSRECTAL ULTRASONIC PROSTATE (TUBP);  Surgeon: [place] Frock, MD;  Location: [name];  Service: Urology;  Laterality: N/A;  . ROBOT  ASSISTED LAPAROSCOPIC RADICAL PROSTATECTOMY N/A 05/06/2015   Procedure: ROBOTIC ASSISTED LAPAROSCOPIC RADICAL PROSTATECTOMY;  Surgeon: [place] Frock, MD;  Location: [name];  Service: Urology;  Laterality: N/A;  . TONSILLECTOMY  as a child  . TOTAL HIP ARTHROPLASTY Right 08/ 2000     MEDICATIONS:  Prior to Admission medications   Medication Sig Start Date End Date Taking? Authorizing Provider  acetaminophen (TYLENOL) 500 MG tablet Take 500 mg by mouth 2 (two) times daily.    Yes [provider]  amoxicillin (AMOXIL) 500 MG capsule Take 4 capsules (2,000 mg total) by mouth daily. Before dental work. 01/09/18  Yes Guy Ghent, MD  Blood Glucose Monitoring Suppl ([name]) w/Device KIT 1 kit by Other route daily. Use as instructed to check blood sugar once daily and as needed.  Diagnosis:  E11.9  Non insulin dependent. 08/22/17  Yes Guy Ghent, MD  glipiZIDE (GLUCOTROL) 5 MG tablet TAKE ONE TABLET BY MOUTH EVERY MORNING BEFORE BREAKFAST 12/15/17  Yes Guy Ghent, MD  [name] - [name] DELICA LANCETS 66Y MISC Use to check blood sugar daily 08/18/17  Yes Guy Ghent, MD  [place] [name] test strip CHECK BLOOD GLUCOSE ONCE DAILY AS DIRECTED 01/24/18  Yes Guy Ghent, MD  ranitidine (ZANTAC) 150 MG tablet Take 1 tablet (150 mg total) by mouth 2 (two) times daily. 10/20/16  Yes Guy Ghent, MD     ALLERGIES:  Allergies  Allergen Reactions  .  Nsaids Other (See Comments)    Avoid due to h/o renal disease  . Pravastatin Other (See Comments)    Aches at '40mg'$  dosing.      SOCIAL HISTORY:  Social History   Socioeconomic History  . Marital status: Widowed    Spouse name: Not on file  . Number of children: 1  . Years of education: Not on file  . Highest education level: Not on file  Occupational History  . 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 Financial resource strain: Not on file  . Food insecurity:     Worry: Not on file    Inability: Not on file  . Transportation needs:    Medical: Not on file    Non-medical: Not on file  Tobacco Use  . Smoking status: Never Smoker  . Smokeless tobacco: Never Used  Substance and Sexual Activity  . Alcohol use: Yes    Alcohol/week: 0.0 standard drinks    Comment: rare  . Drug use: No  . Sexual activity: Not Currently  Lifestyle  . Physical activity:    Days per week: Not on file    Minutes per session: Not on file  . Stress: Not on file  Relationships  . Social connections:    Talks on phone: Not on file    Gets together: Not on file    Attends religious service: Not on file    Active member of club or organization: Not on file    Attends meetings of clubs or organizations: Not on file    Relationship status: Not on file  . Intimate partner violence:    Fear of current or ex partner: Not on file    Emotionally abused: Not on file    Physically abused: Not on file    Forced sexual activity: Not on file  Other Topics Concern  . Not on file  Social History Narrative   [employer] 2 years, E5 ([place] '69-71)   Married 1980, widowed 2014   1 step daughter    Tourist information centre manager   Retired from [employer]    The patient currently resides (home / rehab facility / nursing home): Home The patient normally is (ambulatory / bedbound): Ambulatory  FAMILY HISTORY:  Family History  Problem Relation Age of Onset  . COPD Mother        smoker  . Cancer Mother        unknown primary  . Alzheimer's disease Father   . Hypertension Father   . Diabetes Father   . Obesity Brother   . Hypertension Brother   . Diabetes Brother   . Heart disease Brother        heart valve  . Prostate cancer Neg Hx   . Colon cancer Neg Hx     Otherwise negative/non-contributory.  REVIEW OF SYSTEMS:  Constitutional: denies any other weight loss, fever, chills, or sweats  Eyes: denies any other vision changes, history of eye injury  ENT: denies sore throat, hearing problems   Respiratory: denies shortness of breath, wheezing  Cardiovascular: denies chest pain, palpitations  Gastrointestinal: abdominal pain, N/V, and bowel function as per HPI Musculoskeletal: denies any other joint pains or cramps  Skin: Denies any other rashes or skin discolorations Neurological: denies any other headache, dizziness, weakness  Psychiatric: Denies any other depression, anxiety   All other review of systems were otherwise negative   VITAL SIGNS:  BP 138/89   Pulse  77   Temp 97.9 F (36.6 C) (Oral)   Ht 5' 5.25" (1.657 m)   Wt 165 lb 9.6 oz (75.1 kg)   BMI 27.35 kg/m    PHYSICAL EXAM:  Constitutional:  -- Normal body habitus  -- Awake, alert, and oriented x3  Eyes:  -- Pupils equally round and reactive to light  -- No scleral icterus  Ear, nose, throat:  -- No jugular venous distension -- No nasal drainage, bleeding Pulmonary:  -- No crackles  -- Equal breath sounds bilaterally -- Breathing non-labored at rest Cardiovascular:  -- S1, S2 present  -- No pericardial rubs  Gastrointestinal:  -- Abdomen soft, nontender, non-distended, no guarding/rebound  -- No abdominal masses appreciated, pulsatile or otherwise  Musculoskeletal and Integumentary:  -- Wounds or skin discoloration: None appreciated -- Extremities: B/L UE and LE FROM, hands and feet warm, no edema  Neurologic:  -- Motor function: Intact and symmetric -- Sensation: Intact and symmetric  Labs:  CBC Latest Ref Rng & Units 05/07/2015 05/06/2015 04/30/2015  WBC 4.0 - 10.5 K/uL - - 6.7  Hemoglobin 13.0 - 17.0 g/dL 12.1(L) 12.8(L) 14.6  Hematocrit 39.0 - 52.0 % 35.7(L) 37.4(L) 42.9  Platelets 150 - 400 K/uL - - 304   CMP Latest Ref Rng & Units 01/01/2018 08/01/2017 12/14/2016  Glucose 70 - 99 mg/dL 139(H) 165(H) -  BUN 6 - 23 mg/dL 29(H) 28(H) -  Creatinine 0.40 - 1.50 mg/dL 1.88(H) 1.75(H) 1.71  Sodium 135 - 145 mEq/L 139 139 -  Potassium 3.5 - 5.1 mEq/L 4.5 4.0 -  Chloride 96 - 112 mEq/L 107  102 -  CO2 19 - 32 mEq/L 24 27 -  Calcium 8.4 - 10.5 mg/dL 9.4 10.0 -  Total Protein 6.0 - 8.3 g/dL 6.8 7.3 -  Total Bilirubin 0.2 - 1.2 mg/dL 0.5 0.9 -  Alkaline Phos 39 - 117 U/L 61 68 -  AST 0 - 37 U/L 15 16 -  ALT 0 - 53 U/L 13 15 -    Imaging studies:  Limited RUQ Abdominal Ultrasound (01/12/2018) Well distended with evidence of cholelithiasis. No wall thickening or pericholecystic fluid is noted. Common bile duct diameter: 4.4 mm.  Assessment/Plan:  72 y.o. male with increasingly symptomatic (painful) cholelithiasis, complicated by co-morbidities including DM, CKD, GERD, osteoarthritis, and a history significant for prostate cancer.              - avoid/minimize foods with higher fat content (meats, cheeses/dairy, and fried)             - prefer low-fat vegetables, whole grains (wheat bread, ceareals, etc), and fruits until cholecystectomy              - all risks, benefits, and alternatives to cholecystectomy were discussed with the patient, all of his questions were answered to his expressed satisfaction, patient expresses he wishes to proceed, and informed consent was obtained.             - will plan for laparoscopic cholecystectomy on 8/30 pending anesthesia and OR availability             - anticipate return to clinic 2 weeks after above planned surgery             - instructed to call if any questions or concerns  All of the above recommendations were discussed with the patient, and all of patient's questions were answered to his expressed satisfaction.  Thank you for the opportunity to participate in this  patient's care.  -- Marilynne Drivers Rosana Hoes, MD, [place]: [facility] - Partnering for exceptional care. Office: [phone]

## 2018-01-31 ENCOUNTER — Encounter: Payer: Self-pay | Admitting: Surgery

## 2018-01-31 ENCOUNTER — Telehealth: Payer: Self-pay | Admitting: Surgery

## 2018-01-31 DIAGNOSIS — H25013 Cortical age-related cataract, bilateral: Secondary | ICD-10-CM | POA: Diagnosis not present

## 2018-01-31 LAB — HM DIABETES EYE EXAM

## 2018-01-31 NOTE — Telephone Encounter (Signed)
I have called patient to go over surgery information. No answer. I have left a message on patient's voicemail to call office to go over information below.   pre op date/time and sx date. Sx: 02/16/18 with Dr Audria Nine cholecystectomy.  Pre op: 02/08/18 @ 9:15am--office interview.   Make patient aware to call [phone], between 1-3:00pm the day before surgery, to find out what time to arrive.

## 2018-02-01 NOTE — Telephone Encounter (Signed)
Patient has called back and all dates and time have been discussed with the patient.

## 2018-02-08 ENCOUNTER — Other Ambulatory Visit: Payer: Self-pay

## 2018-02-08 ENCOUNTER — Encounter
Admission: RE | Admit: 2018-02-08 | Discharge: 2018-02-08 | Disposition: A | Discharge status: Home / Self Care | Payer: Medicare Other | Source: Ambulatory Visit | Attending: Surgery | Admitting: Surgery

## 2018-02-08 DIAGNOSIS — Z01812 Encounter for preprocedural laboratory examination: Secondary | ICD-10-CM | POA: Insufficient documentation

## 2018-02-08 DIAGNOSIS — Z01810 Encounter for preprocedural cardiovascular examination: Secondary | ICD-10-CM | POA: Insufficient documentation

## 2018-02-08 DIAGNOSIS — E119 Type 2 diabetes mellitus without complications: Secondary | ICD-10-CM | POA: Diagnosis not present

## 2018-02-08 HISTORY — DX: Chronic kidney disease, unspecified: N18.9

## 2018-02-08 LAB — COMPREHENSIVE METABOLIC PANEL
ALT: 15 U/L (ref 0–44)
ANION GAP: 8 (ref 5–15)
AST: 21 U/L (ref 15–41)
Albumin: 4.7 g/dL (ref 3.5–5.0)
Alkaline Phosphatase: 68 U/L (ref 38–126)
BUN: 32 mg/dL — ABNORMAL HIGH (ref 8–23)
CHLORIDE: 104 mmol/L (ref 98–111)
CO2: 27 mmol/L (ref 22–32)
CREATININE: 1.89 mg/dL — AB (ref 0.61–1.24)
Calcium: 9.5 mg/dL (ref 8.9–10.3)
GFR calc non Af Amer: 34 mL/min — ABNORMAL LOW (ref >60)
GFR, EST AFRICAN AMERICAN: 40 mL/min — AB (ref >60)
Glucose, Bld: 117 mg/dL — ABNORMAL HIGH (ref 70–99)
POTASSIUM: 4.5 mmol/L (ref 3.5–5.1)
SODIUM: 139 mmol/L (ref 135–145)
Total Bilirubin: 0.5 mg/dL (ref 0.3–1.2)
Total Protein: 7.7 g/dL (ref 6.5–8.1)

## 2018-02-08 LAB — CBC WITH DIFFERENTIAL/PLATELET
Basophils Absolute: 0.1 10*3/uL (ref 0–0.1)
Basophils Relative: 1 %
EOS ABS: 0.1 10*3/uL (ref 0–0.7)
EOS PCT: 1 %
HCT: 48.7 % (ref 40.0–52.0)
Hemoglobin: 16.3 g/dL (ref 13.0–18.0)
LYMPHS ABS: 1.5 10*3/uL (ref 1.0–3.6)
LYMPHS PCT: 29 %
MCH: 32.3 pg (ref 26.0–34.0)
MCHC: 33.5 g/dL (ref 32.0–36.0)
MCV: 96.4 fL (ref 80.0–100.0)
MONO ABS: 0.5 10*3/uL (ref 0.2–1.0)
MONOS PCT: 9 %
Neutro Abs: 3.1 10*3/uL (ref 1.4–6.5)
Neutrophils Relative %: 60 %
PLATELETS: 298 10*3/uL (ref 150–440)
RBC: 5.05 MIL/uL (ref 4.40–5.90)
RDW: 13.3 % (ref 11.5–14.5)
WBC: 5.2 10*3/uL (ref 3.8–10.6)

## 2018-02-08 NOTE — Pre-Procedure Instructions (Signed)
Met C results sent to Dr. Rosana Hoes and Anesthesia for review.

## 2018-02-08 NOTE — Patient Instructions (Signed)
Your procedure is scheduled on: Friday 02/16/18 Report to [facility]. To find out your arrival time please call [phone] between 1PM - 3PM on Thursday 02/15/18.  Remember: Instructions that are not followed completely may result in serious medical risk, up to and including death, or upon the discretion of your surgeon and anesthesiologist your surgery may need to be rescheduled.     _X__ 1. Do not eat food after midnight the night before your procedure.                 No gum chewing or hard candies. You may drink clear liquids up to 2 hours                 before you are scheduled to arrive for your surgery- DO not drink clear                 liquids within 2 hours of the start of your surgery.                 Clear Liquids include:  water, apple juice without pulp, clear carbohydrate                 drink such as Clearfast or Gatorade, Black Coffee or Tea (Do not add                 anything to coffee or tea).  __X__2.  On the morning of surgery brush your teeth with toothpaste and water, you                 may rinse your mouth with mouthwash if you wish.  Do not swallow any              toothpaste of mouthwash.     _X__ 3.  No Alcohol for 24 hours before or after surgery.   _X__ 4.  Do Not Smoke or use e-cigarettes For 24 Hours Prior to Your Surgery.                 Do not use any chewable tobacco products for at least 6 hours prior to                 surgery.  ____  5.  Bring all medications with you on the day of surgery if instructed.   __X__  6.  Notify your doctor if there is any change in your medical condition      (cold, fever, infections).     Do not wear jewelry, make-up, hairpins, clips or nail polish. Do not wear lotions, powders, or perfumes.  Do not shave 48 hours prior to surgery. Men may shave face and neck. Do not bring valuables to the hospital.    [facility] is not responsible for any belongings or  valuables.  Contacts, dentures/partials or body piercings may not be worn into surgery. Bring a case for your contacts, glasses or hearing aids, a denture cup will be supplied. Leave your suitcase in the car. After surgery it may be brought to your room. For patients admitted to the hospital, discharge time is determined by your treatment team.   Patients discharged the day of surgery will not be allowed to drive home.   Please read over the following fact sheets that you were given:   MRSA Information  __X__ Take these medicines the morning of surgery with A SIP OF WATER:  1. Zantac  2.   3.   4.  5.  6.  ____ Fleet Enema (as directed)   __X__ Use CHG Soap/SAGE wipes as directed  ____ Use inhalers on the day of surgery  ____ Stop metformin/Janumet/Farxiga 2 days prior to surgery    ____ Take 1/2 of usual insulin dose the night before surgery. No insulin the morning          of surgery.   ____ Stop Blood Thinners Coumadin/Plavix/Xarelto/Pleta/Pradaxa/Eliquis/Effient/Aspirin  on   Or contact your Surgeon, Cardiologist or Medical Doctor regarding  ability to stop your blood thinners  __X__ Stop Anti-inflammatories 7 days before surgery such as Advil, Ibuprofen, Motrin,  BC or Goodies Powder, Naprosyn, Naproxen, Aleve, Aspirin   TYLENOL IS OK  __X__ Stop all herbal supplements, fish oil or vitamin E until after surgery.    ____ Bring C-Pap to the hospital.

## 2018-02-15 MED ORDER — CEFAZOLIN SODIUM-DEXTROSE 2-4 GM/100ML-% IV SOLN
2.0000 g | INTRAVENOUS | Status: AC
  Administered 2018-02-16: 2 g via INTRAVENOUS

## 2018-02-16 ENCOUNTER — Encounter
Admission: RE | Disposition: A | Discharge status: Home / Self Care | Payer: Self-pay | Source: Ambulatory Visit | Attending: Surgery

## 2018-02-16 ENCOUNTER — Other Ambulatory Visit: Payer: Self-pay

## 2018-02-16 ENCOUNTER — Ambulatory Visit: Payer: Medicare Other | Admitting: Anesthesiology

## 2018-02-16 ENCOUNTER — Ambulatory Visit
Admission: RE | Admit: 2018-02-16 | Discharge: 2018-02-16 | Disposition: A | Discharge status: Home / Self Care | Payer: Medicare Other | Source: Ambulatory Visit | Attending: Surgery | Admitting: Surgery

## 2018-02-16 DIAGNOSIS — I129 Hypertensive chronic kidney disease with stage 1 through stage 4 chronic kidney disease, or unspecified chronic kidney disease: Secondary | ICD-10-CM | POA: Insufficient documentation

## 2018-02-16 DIAGNOSIS — M1990 Unspecified osteoarthritis, unspecified site: Secondary | ICD-10-CM | POA: Insufficient documentation

## 2018-02-16 DIAGNOSIS — Z7984 Long term (current) use of oral hypoglycemic drugs: Secondary | ICD-10-CM | POA: Diagnosis not present

## 2018-02-16 DIAGNOSIS — Z79899 Other long term (current) drug therapy: Secondary | ICD-10-CM | POA: Insufficient documentation

## 2018-02-16 DIAGNOSIS — Z888 Allergy status to other drugs, medicaments and biological substances status: Secondary | ICD-10-CM | POA: Insufficient documentation

## 2018-02-16 DIAGNOSIS — K219 Gastro-esophageal reflux disease without esophagitis: Secondary | ICD-10-CM | POA: Diagnosis not present

## 2018-02-16 DIAGNOSIS — N189 Chronic kidney disease, unspecified: Secondary | ICD-10-CM | POA: Diagnosis not present

## 2018-02-16 DIAGNOSIS — Z85828 Personal history of other malignant neoplasm of skin: Secondary | ICD-10-CM | POA: Diagnosis not present

## 2018-02-16 DIAGNOSIS — K8010 Calculus of gallbladder with chronic cholecystitis without obstruction: Secondary | ICD-10-CM | POA: Diagnosis not present

## 2018-02-16 DIAGNOSIS — F419 Anxiety disorder, unspecified: Secondary | ICD-10-CM | POA: Insufficient documentation

## 2018-02-16 DIAGNOSIS — K8020 Calculus of gallbladder without cholecystitis without obstruction: Secondary | ICD-10-CM | POA: Diagnosis not present

## 2018-02-16 DIAGNOSIS — E1122 Type 2 diabetes mellitus with diabetic chronic kidney disease: Secondary | ICD-10-CM | POA: Insufficient documentation

## 2018-02-16 DIAGNOSIS — Z8546 Personal history of malignant neoplasm of prostate: Secondary | ICD-10-CM | POA: Diagnosis not present

## 2018-02-16 DIAGNOSIS — Z886 Allergy status to analgesic agent status: Secondary | ICD-10-CM | POA: Insufficient documentation

## 2018-02-16 DIAGNOSIS — R1011 Right upper quadrant pain: Secondary | ICD-10-CM | POA: Diagnosis not present

## 2018-02-16 HISTORY — DX: Failed or difficult intubation, initial encounter: T88.4XXA

## 2018-02-16 HISTORY — PX: CHOLECYSTECTOMY: SHX55

## 2018-02-16 LAB — GLUCOSE, CAPILLARY
Glucose-Capillary: 128 mg/dL — ABNORMAL HIGH (ref 70–99)
Glucose-Capillary: 246 mg/dL — ABNORMAL HIGH (ref 70–99)

## 2018-02-16 SURGERY — LAPAROSCOPIC CHOLECYSTECTOMY
Anesthesia: General | Wound class: Clean Contaminated

## 2018-02-16 MED ORDER — ONDANSETRON HCL 4 MG/2ML IJ SOLN
INTRAMUSCULAR | Status: AC
  Filled 2018-02-16: qty 2

## 2018-02-16 MED ORDER — LIDOCAINE HCL (PF) 1 % IJ SOLN
INTRAMUSCULAR | Status: AC
  Filled 2018-02-16: qty 30

## 2018-02-16 MED ORDER — EPHEDRINE SULFATE 50 MG/ML IJ SOLN
INTRAMUSCULAR | Status: AC
  Filled 2018-02-16: qty 1

## 2018-02-16 MED ORDER — ROCURONIUM BROMIDE 50 MG/5ML IV SOLN
INTRAVENOUS | Status: AC
  Filled 2018-02-16: qty 1

## 2018-02-16 MED ORDER — FAMOTIDINE 20 MG PO TABS
ORAL_TABLET | ORAL | Status: AC
  Administered 2018-02-16: 20 mg via ORAL
  Filled 2018-02-16: qty 1

## 2018-02-16 MED ORDER — EPHEDRINE SULFATE 50 MG/ML IJ SOLN
INTRAMUSCULAR | Status: DC | PRN
  Administered 2018-02-16 (×2): 10 mg via INTRAVENOUS

## 2018-02-16 MED ORDER — SODIUM CHLORIDE FLUSH 0.9 % IV SOLN
INTRAVENOUS | Status: AC
  Filled 2018-02-16: qty 10

## 2018-02-16 MED ORDER — OXYCODONE-ACETAMINOPHEN 5-325 MG PO TABS: 1.0000 | ORAL_TABLET | ORAL | 0 refills | Status: DC | PRN

## 2018-02-16 MED ORDER — MIDAZOLAM HCL 2 MG/2ML IJ SOLN
INTRAMUSCULAR | Status: DC | PRN
  Administered 2018-02-16: 2 mg via INTRAVENOUS

## 2018-02-16 MED ORDER — FENTANYL CITRATE (PF) 100 MCG/2ML IJ SOLN
INTRAMUSCULAR | Status: DC | PRN
  Administered 2018-02-16 (×4): 50 ug via INTRAVENOUS
  Administered 2018-02-16: 100 ug via INTRAVENOUS

## 2018-02-16 MED ORDER — CHLORHEXIDINE GLUCONATE CLOTH 2 % EX PADS: 6.0000 | MEDICATED_PAD | Freq: Once | CUTANEOUS | Status: DC

## 2018-02-16 MED ORDER — LIDOCAINE HCL (PF) 2 % IJ SOLN
INTRAMUSCULAR | Status: AC
  Filled 2018-02-16: qty 10

## 2018-02-16 MED ORDER — ACETAMINOPHEN 500 MG PO TABS
ORAL_TABLET | ORAL | Status: AC
  Administered 2018-02-16: 1000 mg via ORAL
  Filled 2018-02-16: qty 2

## 2018-02-16 MED ORDER — PROPOFOL 10 MG/ML IV BOLUS
INTRAVENOUS | Status: DC | PRN
  Administered 2018-02-16: 140 mg via INTRAVENOUS

## 2018-02-16 MED ORDER — FENTANYL CITRATE (PF) 250 MCG/5ML IJ SOLN
INTRAMUSCULAR | Status: AC
  Filled 2018-02-16: qty 5

## 2018-02-16 MED ORDER — OXYCODONE HCL 5 MG PO TABS: 5.0000 mg | ORAL_TABLET | Freq: Once | ORAL | Status: DC | PRN

## 2018-02-16 MED ORDER — SUGAMMADEX SODIUM 200 MG/2ML IV SOLN
INTRAVENOUS | Status: DC | PRN
  Administered 2018-02-16: 100 mg via INTRAVENOUS

## 2018-02-16 MED ORDER — FAMOTIDINE 20 MG PO TABS
20.0000 mg | ORAL_TABLET | Freq: Once | ORAL | Status: AC
  Administered 2018-02-16: 20 mg via ORAL

## 2018-02-16 MED ORDER — SUGAMMADEX SODIUM 200 MG/2ML IV SOLN
INTRAVENOUS | Status: AC
  Filled 2018-02-16: qty 2

## 2018-02-16 MED ORDER — PROPOFOL 10 MG/ML IV BOLUS
INTRAVENOUS | Status: AC
  Filled 2018-02-16: qty 40

## 2018-02-16 MED ORDER — ROCURONIUM BROMIDE 100 MG/10ML IV SOLN
INTRAVENOUS | Status: DC | PRN
  Administered 2018-02-16: 50 mg via INTRAVENOUS

## 2018-02-16 MED ORDER — FENTANYL CITRATE (PF) 100 MCG/2ML IJ SOLN
25.0000 ug | INTRAMUSCULAR | Status: DC | PRN
  Administered 2018-02-16 (×2): 25 ug via INTRAVENOUS
  Administered 2018-02-16: 50 ug via INTRAVENOUS

## 2018-02-16 MED ORDER — SODIUM CHLORIDE 0.9 % IV SOLN
INTRAVENOUS | Status: DC
  Administered 2018-02-16 (×2): via INTRAVENOUS

## 2018-02-16 MED ORDER — CEFAZOLIN SODIUM-DEXTROSE 2-4 GM/100ML-% IV SOLN
INTRAVENOUS | Status: AC
  Filled 2018-02-16: qty 100

## 2018-02-16 MED ORDER — FENTANYL CITRATE (PF) 100 MCG/2ML IJ SOLN
INTRAMUSCULAR | Status: AC
  Administered 2018-02-16: 50 ug via INTRAVENOUS
  Filled 2018-02-16: qty 2

## 2018-02-16 MED ORDER — ONDANSETRON HCL 4 MG/2ML IJ SOLN
INTRAMUSCULAR | Status: DC | PRN
  Administered 2018-02-16: 4 mg via INTRAVENOUS

## 2018-02-16 MED ORDER — OXYCODONE HCL 5 MG/5ML PO SOLN: 5.0000 mg | Freq: Once | ORAL | Status: DC | PRN

## 2018-02-16 MED ORDER — FENTANYL CITRATE (PF) 100 MCG/2ML IJ SOLN
INTRAMUSCULAR | Status: AC
  Filled 2018-02-16: qty 2

## 2018-02-16 MED ORDER — PROMETHAZINE HCL 25 MG/ML IJ SOLN
INTRAMUSCULAR | Status: AC
  Administered 2018-02-16: 12.5 mg via INTRAVENOUS
  Filled 2018-02-16: qty 1

## 2018-02-16 MED ORDER — BUPIVACAINE HCL (PF) 0.5 % IJ SOLN
INTRAMUSCULAR | Status: AC
  Filled 2018-02-16: qty 30

## 2018-02-16 MED ORDER — ACETAMINOPHEN 500 MG PO TABS
1000.0000 mg | ORAL_TABLET | ORAL | Status: AC
  Administered 2018-02-16: 1000 mg via ORAL

## 2018-02-16 MED ORDER — LIDOCAINE HCL 1 % IJ SOLN
INTRAMUSCULAR | Status: DC | PRN
  Administered 2018-02-16: 20 mL

## 2018-02-16 MED ORDER — PROMETHAZINE HCL 25 MG/ML IJ SOLN
6.2500 mg | INTRAMUSCULAR | Status: DC | PRN
  Administered 2018-02-16: 12.5 mg via INTRAVENOUS

## 2018-02-16 MED ORDER — MIDAZOLAM HCL 2 MG/2ML IJ SOLN
INTRAMUSCULAR | Status: AC
  Filled 2018-02-16: qty 2

## 2018-02-16 SURGICAL SUPPLY — 27 items
APPLIER CLIP ROT 10 11.4 M/L (STAPLE) ×2
CHLORAPREP W/TINT 26ML (MISCELLANEOUS) ×2 IMPLANT
CLIP APPLIE ROT 10 11.4 M/L (STAPLE) ×1 IMPLANT
DERMABOND ADVANCED (GAUZE/BANDAGES/DRESSINGS) ×1
DERMABOND ADVANCED .7 DNX12 (GAUZE/BANDAGES/DRESSINGS) ×1 IMPLANT
ELECT REM PT RETURN 9FT ADLT (ELECTROSURGICAL) ×2
ELECTRODE REM PT RTRN 9FT ADLT (ELECTROSURGICAL) ×1 IMPLANT
GLOVE BIO SURGEON STRL SZ7 (GLOVE) ×2 IMPLANT
GLOVE BIOGEL PI IND STRL 7.5 (GLOVE) ×1 IMPLANT
GLOVE BIOGEL PI INDICATOR 7.5 (GLOVE) ×1
GOWN STRL REUS W/ TWL LRG LVL3 (GOWN DISPOSABLE) ×3 IMPLANT
GOWN STRL REUS W/TWL LRG LVL3 (GOWN DISPOSABLE) ×3
GRASPER SUT TROCAR 14GX15 (MISCELLANEOUS) ×2 IMPLANT
KIT TURNOVER KIT A (KITS) ×2 IMPLANT
NEEDLE HYPO 22GX1.5 SAFETY (NEEDLE) ×2 IMPLANT
NEEDLE INSUFFLATION 14GA 120MM (NEEDLE) ×2 IMPLANT
NS IRRIG 1000ML POUR BTL (IV SOLUTION) ×2 IMPLANT
PACK LAP CHOLECYSTECTOMY (MISCELLANEOUS) ×2 IMPLANT
POUCH SPECIMEN RETRIEVAL 10MM (ENDOMECHANICALS) ×2 IMPLANT
SCISSORS METZENBAUM CVD 33 (INSTRUMENTS) IMPLANT
SLEEVE ENDOPATH XCEL 5M (ENDOMECHANICALS) ×4 IMPLANT
SUT MNCRL AB 4-0 PS2 18 (SUTURE) ×2 IMPLANT
SUT VICRYL 0 UR6 27IN ABS (SUTURE) ×2 IMPLANT
SUT VICRYL AB 3-0 FS1 BRD 27IN (SUTURE) ×2 IMPLANT
TROCAR XCEL NON-BLD 11X100MML (ENDOMECHANICALS) ×2 IMPLANT
TROCAR XCEL NON-BLD 5MMX100MML (ENDOMECHANICALS) ×2 IMPLANT
TUBING INSUFFLATION (TUBING) ×2 IMPLANT

## 2018-02-16 NOTE — Interval H&P Note (Signed)
History and Physical Interval Note:  02/16/2018 7:06 AM  [name]  has presented today for surgery, with the diagnosis of right upper quadrant pain  The various methods of treatment have been discussed with the patient and family. After consideration of risks, benefits and other options for treatment, the patient has consented to  Procedure(s): LAPAROSCOPIC CHOLECYSTECTOMY (N/A) as a surgical intervention .  The patient's history has been reviewed, patient examined, no change in status, stable for surgery.  I have reviewed the patient's chart and labs.  Questions were answered to the patient's satisfaction.     Vickie Epley

## 2018-02-16 NOTE — Anesthesia Procedure Notes (Signed)
Procedure Name: Intubation Date/Time: 02/16/2018 7:42 AM Performed by: Gentry Fitz, CRNA Pre-anesthesia Checklist: Patient identified, Emergency Drugs available, Suction available and Patient being monitored Patient Re-evaluated:Patient Re-evaluated prior to induction Oxygen Delivery Method: Circle system utilized Preoxygenation: Pre-oxygenation with 100% oxygen Induction Type: IV induction Ventilation: Mask ventilation without difficulty Laryngoscope Size: McGraph and 4 Grade View: Grade II Tube type: Oral Tube size: 7.5 mm Number of attempts: 1 Airway Equipment and Method: Stylet Placement Confirmation: ETT inserted through vocal cords under direct vision,  positive ETCO2 and breath sounds checked- equal and bilateral Secured at: 23 cm Tube secured with: Tape Dental Injury: Teeth and Oropharynx as per pre-operative assessment  Difficulty Due To: Difficulty was anticipated Comments: Anterior, grade II with Jamas Lav

## 2018-02-16 NOTE — Discharge Instructions (Addendum)
In addition to included general post-operative instructions for Laparoscopic Cholecystectomy,  Diet: Resume home heart healthy diet (may prefer to start with low fat foods as discussed).   Activity: No heavy lifting >20 pounds (children, pets, laundry, garbage) or strenuous activity until follow-up, but light activity and walking are encouraged. Do not drive or drink alcohol if taking narcotic pain medications.  Wound care: 2 days after surgery (Sunday, 9/1), you may shower/get incision wet with soapy water and pat dry (do not rub incisions), but no baths or submerging incision underwater until follow-up.   Medications: Resume all home medications. For mild to moderate pain: acetaminophen (Tylenol). Combining Tylenol with alcohol can substantially increase your risk of causing liver disease. Narcotic pain medications, if prescribed, can be used for severe pain, though may cause nausea, constipation, and drowsiness. Do not combine Tylenol and Percocet (or similar) within a 6 hour period as Percocet (and similar) contain(s) Tylenol. If you do not need the narcotic pain medication, you do not need to fill the prescription.  Call office [phone]) at any time if any questions, worsening pain, fevers/chills, bleeding, drainage from incision site, or other concerns.  AMBULATORY SURGERY  DISCHARGE INSTRUCTIONS   1) The drugs that you were given will stay in your system until tomorrow so for the next 24 hours you should not:  A) Drive an automobile B) Make any legal decisions C) Drink any alcoholic beverage   2) You may resume regular meals tomorrow.  Today it is better to start with liquids and gradually work up to solid foods.  You may eat anything you prefer, but it is better to start with liquids, then soup and crackers, and gradually work up to solid foods.   3) Please notify your doctor immediately if you have any unusual bleeding, trouble breathing, redness and pain at the surgery  site, drainage, fever, or pain not relieved by medication.    4) Additional Instructions:        Please contact your physician with any problems or Same Day Surgery at [phone], Monday through Friday 6 am to 4 pm, or [place] at [facility] number at [phone].

## 2018-02-16 NOTE — Anesthesia Post-op Follow-up Note (Signed)
Anesthesia QCDR form completed.        

## 2018-02-16 NOTE — Anesthesia Preprocedure Evaluation (Addendum)
Anesthesia Evaluation  Patient identified by MRN, date of birth, ID band Patient awake    Reviewed: Allergy & Precautions, H&P , NPO status , Patient's Chart, lab work & pertinent test results  Airway Mallampati: III  TM Distance: >3 FB Neck ROM: full    Dental no notable dental hx. (+) Teeth Intact   Pulmonary neg pulmonary ROS, neg pneumonia , neg COPD,    breath sounds clear to auscultation       Cardiovascular hypertension, (-) angina(-) Past MI, (-) Cardiac Stents and (-) CABG negative cardio ROS   Rhythm:regular Rate:Normal     Neuro/Psych Anxiety negative neurological ROS  negative psych ROS   GI/Hepatic negative GI ROS, Neg liver ROS, hiatal hernia, GERD  ,  Endo/Other  negative endocrine ROSdiabetes  Renal/GU Renal diseasenegative Renal ROS  negative genitourinary   Musculoskeletal  (+) Arthritis ,   Abdominal   Peds  Hematology negative hematology ROS (+)   Anesthesia Other Findings Past Medical History: No date: Anxiety     Comment:  not clinical No date: Arthritis No date: Cataract No date: Chronic kidney disease No date: Diabetes mellitus type II     Comment:  oral meds No date: Elevated PSA No date: GERD (gastroesophageal reflux disease) No date: History of basal cell carcinoma excision     Comment:  1990's--  moh's surg. of nose No date: History of hiatal hernia No date: Neck complaint     Comment:  "stiffness, catches"-denies numbness, tingling of arms 2016: Prostate cancer ([place]) No date: Wears contact lenses  Past Surgical History: 06-01-2011: COLONOSCOPY 12/06/2016: COLONOSCOPY WITH PROPOFOL; N/A     Comment:  Procedure: COLONOSCOPY WITH PROPOFOL;  Surgeon: [name], MD;  Location: [facility] ENDOSCOPY;  Service:               Endoscopy;  Laterality: N/A; 07/29/05: ESOPHAGOGASTRODUODENOSCOPY     Comment:  wnl H. H. No date: JOINT REPLACEMENT 05/06/2015: LYMPHADENECTOMY;  N/A     Comment:  Procedure: LYMPH NODE DISSECTION;  Surgeon: Alexis Frock, MD;  Location: [facility];  Service: Urology;                Laterality: N/A; 1990's: MOHS SURGERY     Comment:  nose 2014: PROSTATE BIOPSY 03/06/2015: PROSTATE BIOPSY; N/A     Comment:  Procedure: SATURATION BIOPSY TRANSRECTAL ULTRASONIC               PROSTATE (TUBP);  Surgeon: Alexis Frock, MD;  Location:              [facility];  Service: Urology;                Laterality: N/A; 05/06/2015: ROBOT ASSISTED LAPAROSCOPIC RADICAL PROSTATECTOMY; N/A     Comment:  Procedure: ROBOTIC ASSISTED LAPAROSCOPIC RADICAL               PROSTATECTOMY;  Surgeon: Alexis Frock, MD;  Location:               [facility];  Service: Urology;  Laterality: N/A; as a child: TONSILLECTOMY 08/ 2000: TOTAL HIP ARTHROPLASTY; Right  BMI    Body Mass Index:  26.96 kg/m      Reproductive/Obstetrics negative OB ROS  Anesthesia Physical Anesthesia Plan  ASA: II  Anesthesia Plan: General and General ETT   Post-op Pain Management:    Induction:   PONV Risk Score and Plan: Ondansetron and Dexamethasone  Airway Management Planned:   Additional Equipment:   Intra-op Plan:   Post-operative Plan:   Informed Consent: I have reviewed the patients History and Physical, chart, labs and discussed the procedure including the risks, benefits and alternatives for the proposed anesthesia with the patient or authorized representative who has indicated his/her understanding and acceptance.   Dental Advisory Given  Plan Discussed with: Anesthesiologist, CRNA and Surgeon  Anesthesia Plan Comments:         Anesthesia Quick Evaluation

## 2018-02-16 NOTE — Transfer of Care (Signed)
Immediate Anesthesia Transfer of Care Note  Patient: NATIVIDAD HALLS  Procedure(s) Performed: LAPAROSCOPIC CHOLECYSTECTOMY (N/A )  Patient Location: PACU  Anesthesia Type:General  Level of Consciousness: awake and drowsy  Airway & Oxygen Therapy: Patient Spontanous Breathing and Patient connected to face mask oxygen  Post-op Assessment: Report given to RN and Post -op Vital signs reviewed and stable  Post vital signs: Reviewed and stable  Last Vitals:  Vitals Value Taken Time  BP 155/94 02/16/2018  9:27 AM  Temp 36.1 C 02/16/2018  9:27 AM  Pulse 93 02/16/2018  9:32 AM  Resp 23 02/16/2018  9:32 AM  SpO2 99 % 02/16/2018  9:32 AM  Vitals shown include unvalidated device data.  Last Pain:  Vitals:   02/16/18 0927  TempSrc:   PainSc: 0-No pain         Complications: No apparent anesthesia complications

## 2018-02-16 NOTE — Op Note (Signed)
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 male who recently presented with post-prandial RUQ > epigastric abdominal pain after eating fatty foods in particular. Ultrasound suggested cholelithiasis without sonographic evidence of cholecystitis. All risks, benefits, and alternatives to above elective procedures were discussed with the patient, who elected to proceed, and informed consent was accordingly obtained at that time.   DETAILS OF PROCEDURE:  Patient was brought to the operating suite and appropriately identified. General anesthesia was administered along with peri-operative prophylactic IV antibiotics, and endotracheal intubation was performed by anesthesiologist, along with NG/OG tube for gastric decompression. In supine position, operative site was prepped and draped in usual sterile fashion, and following a brief time out, initial 5 mm incision was made in a natural skin crease just above the umbilicus. Fascia was then elevated, and a Verress needle was inserted and its proper position confirmed using aspiration and saline meniscus test.  Upon insufflation of the abdominal cavity with carbon dioxide to a well-tolerated  pressure of 12-15 mmHg, 5 mm peri-umbilical port followed by laparoscope were inserted and used to inspect the abdominal cavity and its contents with no injuries from insertion of the first trochar noted. Three additional trocars were inserted, one at the epigastric position (10 mm) and two along the Right costal margin (5 mm). The table was then placed in reverse Trendelenburg position with the Right side up. Filmy adhesions between the gallbladder and omentum/duodenum/transverse colon were lysed using combined blunt dissection and selective electrocautery. The apex/dome of the gallbladder was grasped with an atraumatic grasper passed through the lateral port and retracted apically over the liver. The infundibulum was also grasped and retracted, exposing Calot's triangle. The peritoneum overlying the gallbladder infundibulum was incised and dissected free of surrounding peritoneal attachments, revealing the cystic duct and cystic artery, which were clipped twice on the patient side and once on the gallbladder specimen side close to the gallbladder. The gallbladder was then dissected from its peritoneal attachments to the liver using electrocautery, and the gallbladder was placed into a laparoscopic specimen bag and removed from the abdominal cavity via the epigastric port site. Hemostasis and secure placement of clips were confirmed, and intra-peritoneal cavity was inspected with no additional findings. PMI laparoscopic fascial closure device was then used to re-approximate fascia at the 10 mm epigastric port site.  All ports were then removed under direct visualization, and abdominal cavity was desuflated. All port sites were irrigated/cleaned, additional local anesthetic was injected at each incision, 3-0 Vicryl was used to re-approximate dermis at 10 mm port site(s), and subcuticular 4-0 Monocryl suture was used to re-approximate skin. Skin was then cleaned, dried, and sterile skin glue was applied. Patient  was then safely able to be awakened, extubated, and transferred to PACU for post-operative monitoring and care.   I was present for all aspects of the above procedure, and no operative complications were  apparent.

## 2018-02-18 NOTE — Anesthesia Postprocedure Evaluation (Signed)
Anesthesia Post Note  Patient: Guy [name]  Procedure(s) Performed: LAPAROSCOPIC CHOLECYSTECTOMY (N/A )  Patient location during evaluation: PACU Anesthesia Type: General Level of consciousness: awake and alert Pain management: pain level controlled Vital Signs Assessment: post-procedure vital signs reviewed and stable Respiratory status: spontaneous breathing, nonlabored ventilation, respiratory function stable and patient connected to nasal cannula oxygen Cardiovascular status: blood pressure returned to baseline and stable Postop Assessment: no apparent nausea or vomiting Anesthetic complications: no     Last Vitals:  Vitals:   02/16/18 1040 02/16/18 1120  BP: (!) 153/79 135/75  Pulse: 86 77  Resp: 16 16  Temp: (!) 36.2 C   SpO2: 100% 99%    Last Pain:  Vitals:   02/16/18 1040  TempSrc: Temporal  PainSc: [place]

## 2018-02-20 LAB — SURGICAL PATHOLOGY

## 2018-02-22 ENCOUNTER — Telehealth: Payer: Self-pay | Admitting: Surgery

## 2018-02-22 NOTE — Telephone Encounter (Signed)
Patient called in today stating he has a dentist appointment on Monday 02-26-18 and he had gb surgery by Dr Rosana Hoes last Friday per patient. He would like to know if it would be ok for him to go to the appointment.Please call him at [phone].

## 2018-03-01 ENCOUNTER — Ambulatory Visit (INDEPENDENT_AMBULATORY_CARE_PROVIDER_SITE_OTHER): Payer: Medicare Other | Admitting: Surgery

## 2018-03-01 ENCOUNTER — Encounter: Payer: Self-pay | Admitting: Surgery

## 2018-03-01 VITALS — BP 122/84 | HR 77 | Temp 98.1°F | Wt 161.0 lb

## 2018-03-01 DIAGNOSIS — Z4889 Encounter for other specified surgical aftercare: Secondary | ICD-10-CM

## 2018-03-01 NOTE — Patient Instructions (Addendum)

## 2018-03-05 ENCOUNTER — Encounter: Payer: Self-pay | Admitting: Surgery

## 2018-03-05 NOTE — Progress Notes (Signed)
Surgical Clinic Progress/Follow-up Note   HPI:  72 y.o. Male presents to clinic for post-op follow-up 2 weeks s/p laparoscopic cholecystectomy Rosana Hoes, 02/16/2018). Patient reports complete resolution of pre-operative pain and has been tolerating regular diet with +flatus and normal BM's, denies N/V, fever/chills, CP, or SOB.  Review of Systems:  Constitutional: denies fever/chills  Respiratory: denies shortness of breath, wheezing  Cardiovascular: denies chest pain, palpitations  Gastrointestinal: abdominal pain, N/V, and bowel function as per interval history Skin: Denies any other rashes or skin discolorations except post-surgical wounds as per interval history  Vital Signs:  BP 122/84   Pulse 77   Temp 98.1 F (36.7 C) (Oral)   Wt 161 lb (73 kg)   SpO2 98% Comment: room air  BMI 26.79 kg/m    Physical Exam:  Constitutional:  -- Normal body habitus  -- Awake, alert, and oriented x3  Pulmonary:  -- No crackles -- Equal breath sounds bilaterally -- Breathing non-labored at rest Cardiovascular:  -- S1, S2 present  -- No pericardial rubs  Gastrointestinal:  -- Soft and non-distended, non-tender to palpation, no guarding/rebound tenderness -- Post-surgical incisions all well-approximated without any peri-incisional erythema or drainage -- No abdominal masses appreciated, pulsatile or otherwise  Musculoskeletal / Integumentary:  -- Wounds or skin discoloration: None appreciated except post-surgical incisions as described above (GI) -- Extremities: B/L UE and LE FROM, hands and feet warm, no edema   Assessment:  72 y.o. yo Male with a problem list including...  Patient Active Problem List   Diagnosis Date Noted  . Healthcare maintenance 01/11/2018  . CKD (chronic kidney disease) 01/11/2018  . RUQ pain 01/11/2018  . Low back pain 01/11/2018  . Insomnia 03/30/2017  . Personal history of colonic polyps   . Benign neoplasm of cecum   . Colon cancer screening 10/21/2016   . Calculus of gallbladder without cholecystitis without obstruction 07/21/2016  . Thumb tendonitis 01/04/2016  . Neck pain 07/19/2015  . Osteoarthritis 07/01/2015  . Prostate cancer ([place]) 03/22/2015  . Advance care planning Jan 28, 2015  . HTN (hypertension) 01/28/15  . Loss or death of family member- widowed 2014 12/24/2012  . Medicare annual wellness visit, subsequent 06/26/2012  . HLD (hyperlipidemia) 06/26/2011  . Diabetes mellitus without complication ([place]) 93/57/0177  . ANXIETY 11/03/2006  . GERD 11/03/2006  . HYPERURICEMIA 11/03/2006  . FATTY LIVER DISEASE 10/31/2006    presents to clinic for post-op follow-up evaluation, doing well 2 weeks s/p laparoscopic cholecysectomy Rosana Hoes, 02/16/2018) for chronic cholecystitis.  Plan:              - advance diet as tolerated              - okay to submerge incisions under water (baths, swimming) prn             - gradually resume all activities without restrictions over next 2 weeks             - apply sunblock particularly to incisions with sun exposure to reduce pigmentation of scars             - return to clinic as needed, instructed to call office if any questions or concerns  All of the above recommendations were discussed with the patient, and all of patient's questions were answered to his expressed satisfaction.  -- Marilynne Drivers Rosana Hoes, MD, [place]: [facility] for exceptional care. Office: [phone]

## 2018-03-25 IMAGING — MR MR ABDOMEN W/O CM
8 of 9 series · 41 of 48 positions shown · non-contrast
Comparison: 02/12/2016 ultrasound.

CLINICAL DATA: Ultrasound demonstrating a possible right renal
mass. Renal failure.

EXAM:
MRI ABDOMEN WITHOUT CONTRAST
TECHNIQUE: Multiplanar multisequence MR imaging was performed without the
administration of intravenous contrast.

[Series 2: T2 · coronal · 8.0mm · 1.56mm/px · 3 of 19 slices shown (1 of 2)]
[im 1/19]
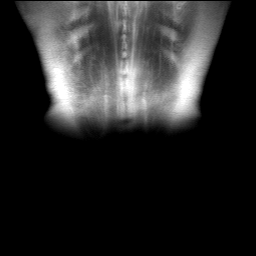
[im 10/19]
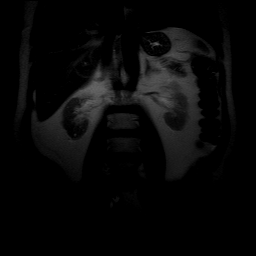
[im 19/19]
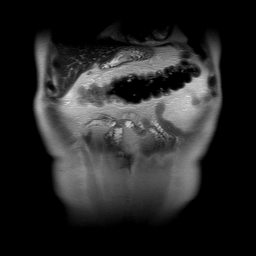

[Series 3: T1 · axial · 8.0mm · 0.74mm/px · z∈[-66,+126]mm · 3 of 21 slices shown (1 of 2)]
[im 1/21]
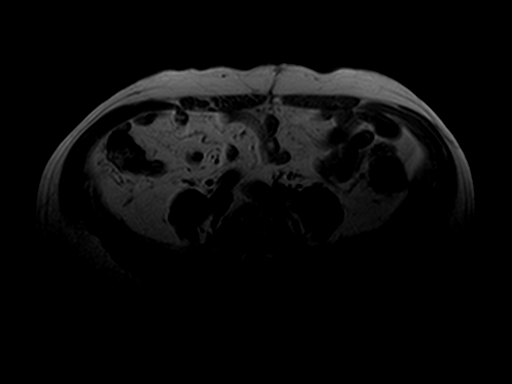
[im 11/21]
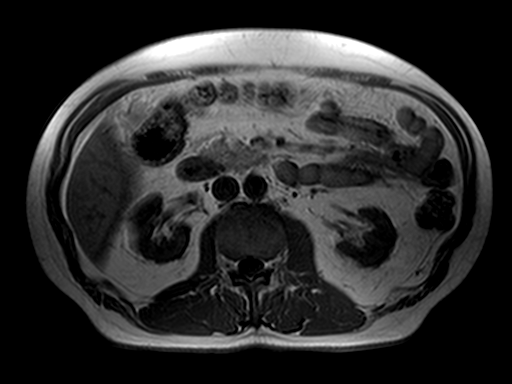
[im 21/21]
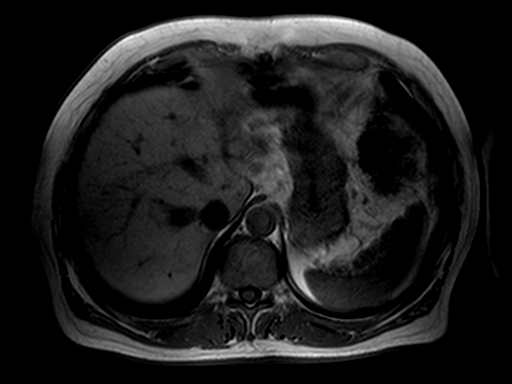

[Series 4: T1 · axial · 8.0mm · 0.74mm/px · z∈[-65,+127]mm · 6 of 42 slices shown (2 of 2)]
[im 1/42]
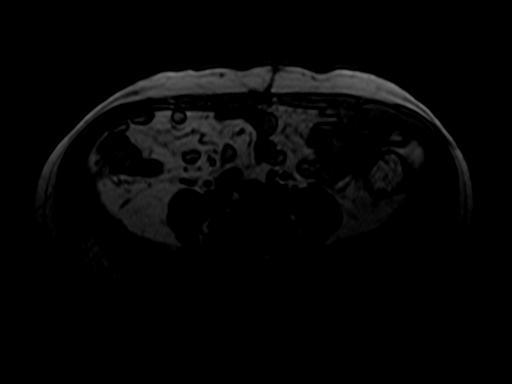
[im 9/42]
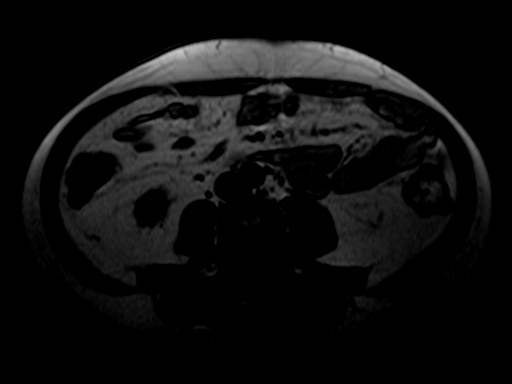
[im 17/42]
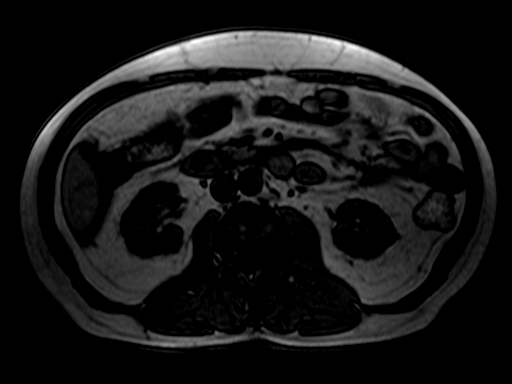
[im 25/42]
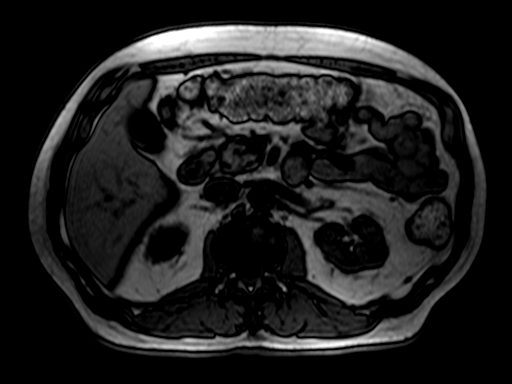
[im 33/42]
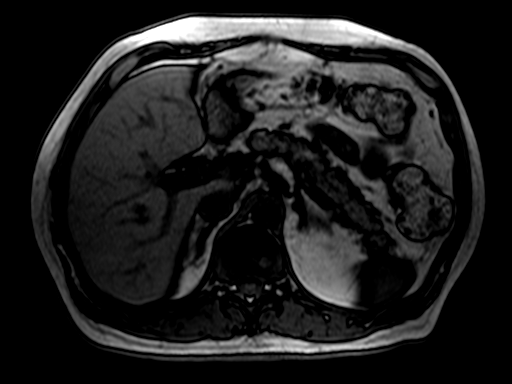
[im 42/42]
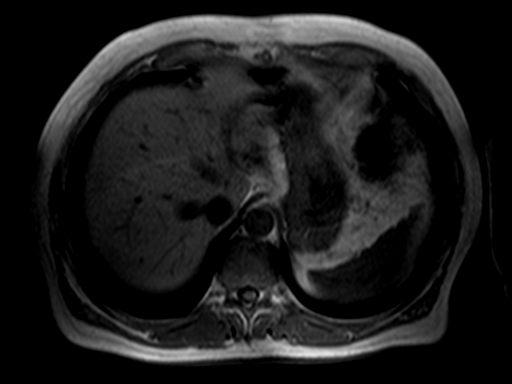

[Series 7: T2 · axial · 8.0mm · 0.74mm/px · z∈[-72,+139]mm · 3 of 23 slices shown (2 of 2)]
[im 1/23]
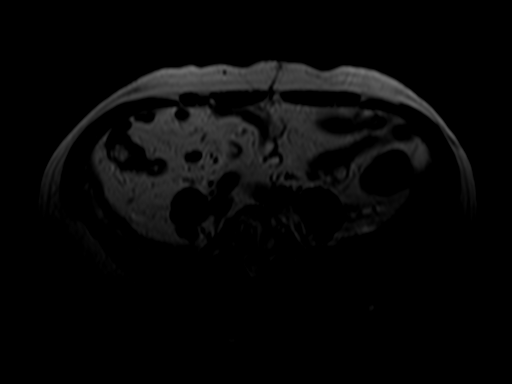
[im 12/23]
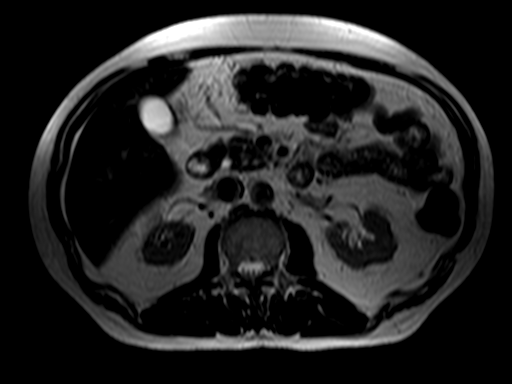
[im 23/23]
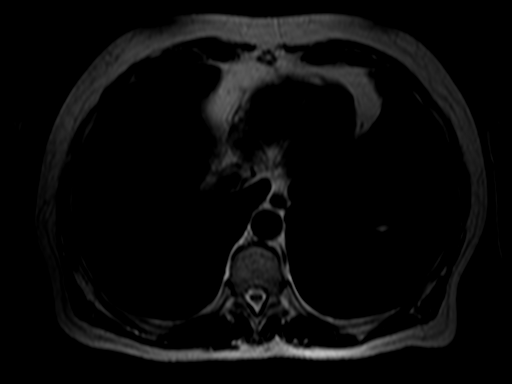

[Series 9: T2 fat-sat · axial · 8.0mm · 0.74mm/px · z∈[-59,+114]mm · 3 of 19 slices shown]
[im 1/19]
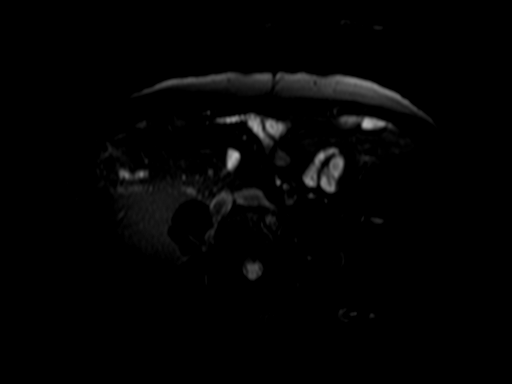
[im 10/19]
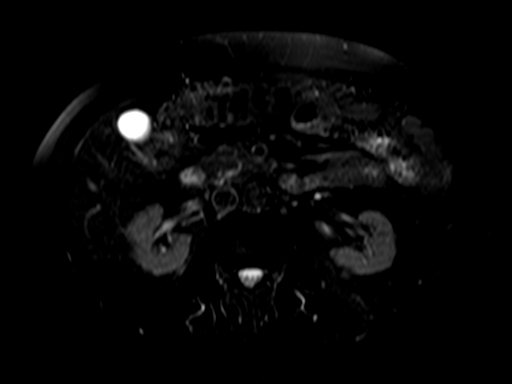
[im 19/19]
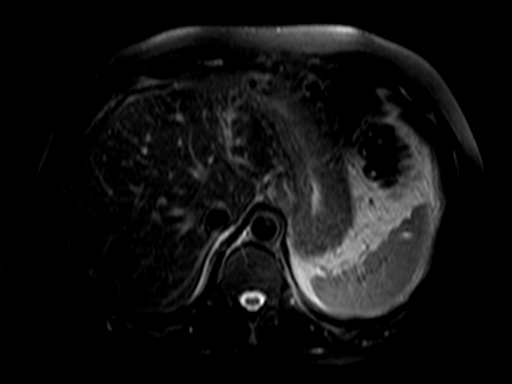

[Series 10: DWI · axial · 6.0mm · 2.93mm/px · z∈[-69,+140]mm · 13 of 90 slices shown]
[im 1/90]
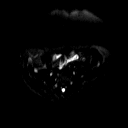
[im 8/90]
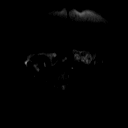
[im 15/90]
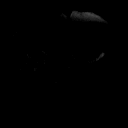
[im 23/90]
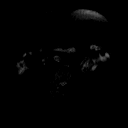
[im 30/90]
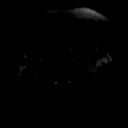
[im 38/90]
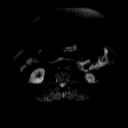
[im 45/90]
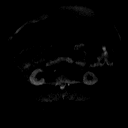
[im 52/90]
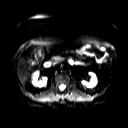
[im 60/90]
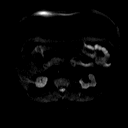
[im 67/90]
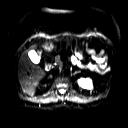
[im 75/90]
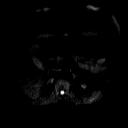
[im 82/90]
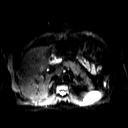
[im 90/90]
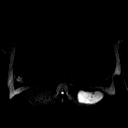

[Series 11: axial dwi_adc · axial · 6.0mm · 2.93mm/px · z∈[-69,+140]mm · 4 of 30 slices shown]
[im 1/30]
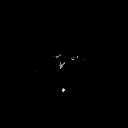
[im 10/30]
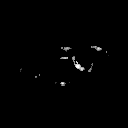
[im 20/30]
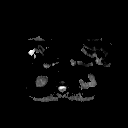
[im 30/30]
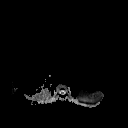

[Series 12: axial true fisp-- · axial · 4.0mm · 0.74mm/px · z∈[-57,+111]mm · 6 of 43 slices shown]
[im 1/43]
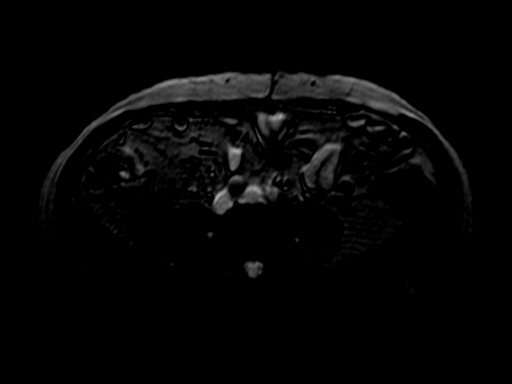
[im 9/43]
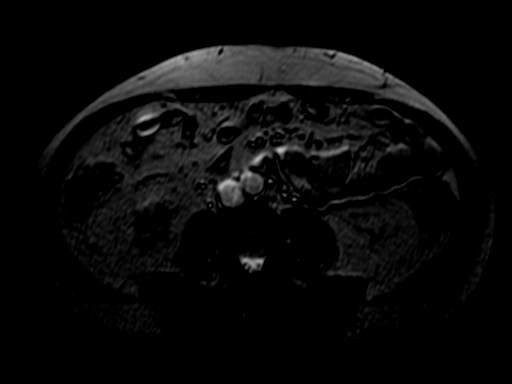
[im 17/43]
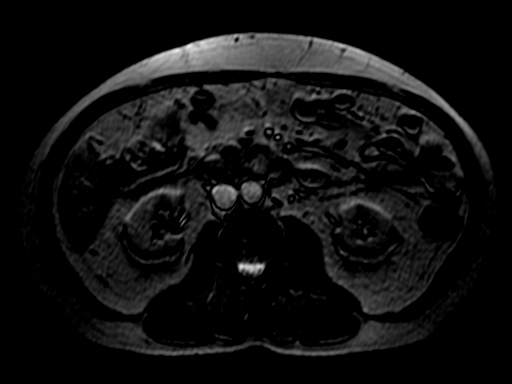
[im 26/43]
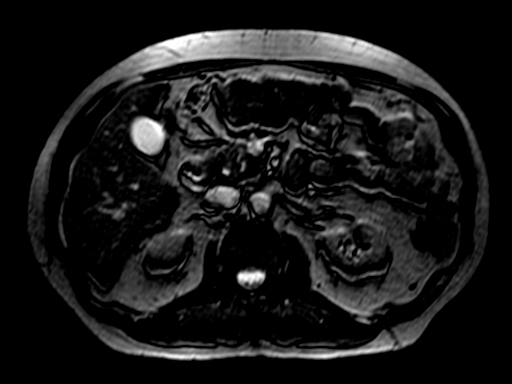
[im 34/43]
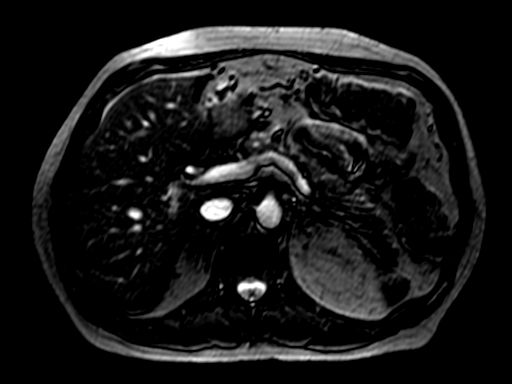
[im 43/43]
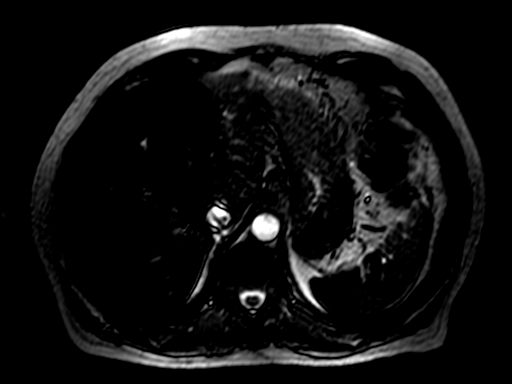

[41 of 48 positions shown; findings below may reference images not displayed]

FINDINGS: Lower chest: Normal heart size without pericardial or pleural
effusion.

Hepatobiliary: Normal liver. 3-4 small gallstones without acute
cholecystitis or biliary duct dilatation.

Pancreas:  Normal, without mass or ductal dilatation.

Spleen:  Normal in size, without focal abnormality.

Adrenals/Urinary Tract: Normal adrenal glands. Mild renal cortical
thinning bilaterally. At least 1 tiny lower pole right renal lesion
which is likely a cyst. No dominant right renal mass or correlate
for the ultrasound abnormality. No hydronephrosis.

Stomach/Bowel: Normal stomach and abdominal bowel loops.

Vascular/Lymphatic: Normal caliber of the aorta and branch vessels.
No retroperitoneal or retrocrural adenopathy.

Other:  No ascites.

Musculoskeletal: No acute osseous abnormality.
IMPRESSION: 1. No evidence of right renal mass or correlate for the ultrasound
abnormality.
2. Cholelithiasis.

## 2018-03-27 DIAGNOSIS — C61 Malignant neoplasm of prostate: Secondary | ICD-10-CM | POA: Diagnosis not present

## 2018-04-02 DIAGNOSIS — C61 Malignant neoplasm of prostate: Secondary | ICD-10-CM | POA: Diagnosis not present

## 2018-04-02 DIAGNOSIS — N5201 Erectile dysfunction due to arterial insufficiency: Secondary | ICD-10-CM | POA: Diagnosis not present

## 2018-04-02 DIAGNOSIS — N183 Chronic kidney disease, stage 3 (moderate): Secondary | ICD-10-CM | POA: Diagnosis not present

## 2018-04-02 DIAGNOSIS — N393 Stress incontinence (female) (male): Secondary | ICD-10-CM | POA: Diagnosis not present

## 2018-04-11 ENCOUNTER — Encounter: Payer: Self-pay | Admitting: Family Medicine

## 2018-04-11 ENCOUNTER — Ambulatory Visit (INDEPENDENT_AMBULATORY_CARE_PROVIDER_SITE_OTHER): Payer: Medicare Other | Admitting: Family Medicine

## 2018-04-11 VITALS — BP 118/78 | HR 90 | Temp 98.3°F | Ht 65.25 in | Wt 163.0 lb

## 2018-04-11 DIAGNOSIS — Z23 Encounter for immunization: Secondary | ICD-10-CM

## 2018-04-11 DIAGNOSIS — E119 Type 2 diabetes mellitus without complications: Secondary | ICD-10-CM | POA: Diagnosis not present

## 2018-04-11 LAB — POCT GLYCOSYLATED HEMOGLOBIN (HGB A1C): HEMOGLOBIN A1C: 7.1 % — AB (ref 4.0–5.6)

## 2018-04-11 MED ORDER — RANITIDINE HCL 150 MG PO TABS: 150.0000 mg | ORAL_TABLET | Freq: Every day | ORAL | Status: DC | PRN

## 2018-04-11 NOTE — Patient Instructions (Addendum)
Given everything that you had going on, you have done a great job.   Recheck in about 3 months, sooner if needed.  Thanks for your effort.   Take care.  Glad to see you.

## 2018-04-11 NOTE — Progress Notes (Signed)
He had his gallbladder out.  D/w pt.  He had occ brief pains at the incision site.  No pain today.  No fevers.  Some occ loose stools, but that has stabilized.    He is cutting back on zantac, now taking 1 tab a day.  D/w pt about recall, he can check with pharmacy about lot numbers.  GERD better overall.    He had post nasal gtt that got better with OTC meds.    Diabetes:  Using medications without difficulties:yes Hypoglycemic episodes: a few times around the surgery, but better in the meantime.   Hyperglycemic episodes:no Feet problems:no Blood Sugars averaging: ~160s around the time of surgery.  Clearly better in the meantime, 100-130 eye exam within last year:  yes A1c d/w pt.    Flu shot today.   He saw urology last week.  Per patient PSA was 0.01.  He'll have routine f/u with them.  He hasn't tried sildenafil yet.  I'll defer to patient and urology.    Meds, vitals, and allergies reviewed.  ROS: Per HPI unless specifically indicated in ROS section   GEN: nad, alert and oriented HEENT: mucous membranes moist NECK: supple w/o LA CV: rrr. PULM: ctab, no inc wob ABD: soft, +bs, previous cholecystectomy port sites appear well-healed. EXT: no edema SKIN: no acute rash

## 2018-04-12 NOTE — Assessment & Plan Note (Signed)
A1c is reasonable, especially given all of his recent events.  Okay for outpatient follow-up.  Continue as is.  No change in medications.  Recheck in a few months.  He agrees.

## 2018-05-28 ENCOUNTER — Encounter: Payer: Self-pay | Admitting: Family Medicine

## 2018-05-28 ENCOUNTER — Ambulatory Visit (INDEPENDENT_AMBULATORY_CARE_PROVIDER_SITE_OTHER): Payer: Medicare Other | Admitting: Family Medicine

## 2018-05-28 ENCOUNTER — Ambulatory Visit (INDEPENDENT_AMBULATORY_CARE_PROVIDER_SITE_OTHER)
Admission: RE | Admit: 2018-05-28 | Discharge: 2018-05-28 | Disposition: A | Discharge status: Home / Self Care | Payer: Medicare Other | Source: Ambulatory Visit | Attending: Family Medicine | Admitting: Family Medicine

## 2018-05-28 VITALS — BP 138/76 | HR 85 | Temp 98.8°F | Ht 65.25 in | Wt 166.2 lb

## 2018-05-28 DIAGNOSIS — M549 Dorsalgia, unspecified: Secondary | ICD-10-CM | POA: Diagnosis not present

## 2018-05-28 LAB — COMPREHENSIVE METABOLIC PANEL
ALT: 11 U/L (ref 0–53)
AST: 16 U/L (ref 0–37)
Albumin: 4.8 g/dL (ref 3.5–5.2)
Alkaline Phosphatase: 64 U/L (ref 39–117)
BUN: 26 mg/dL — ABNORMAL HIGH (ref 6–23)
CALCIUM: 9.9 mg/dL (ref 8.4–10.5)
CO2: 27 meq/L (ref 19–32)
Chloride: 102 mEq/L (ref 96–112)
Creatinine, Ser: 2.11 mg/dL — ABNORMAL HIGH (ref 0.40–1.50)
GFR: 32.97 mL/min — AB (ref >60.00)
Glucose, Bld: 181 mg/dL — ABNORMAL HIGH (ref 70–99)
POTASSIUM: 4.5 meq/L (ref 3.5–5.1)
Sodium: 138 mEq/L (ref 135–145)
Total Bilirubin: 0.8 mg/dL (ref 0.2–1.2)
Total Protein: 7.4 g/dL (ref 6.0–8.3)

## 2018-05-28 LAB — CBC WITH DIFFERENTIAL/PLATELET
Basophils Absolute: 0.1 10*3/uL (ref 0.0–0.1)
Basophils Relative: 1.7 % (ref 0.0–3.0)
EOS ABS: 0.1 10*3/uL (ref 0.0–0.7)
Eosinophils Relative: 1.3 % (ref 0.0–5.0)
HEMATOCRIT: 46.7 % (ref 39.0–52.0)
Hemoglobin: 15.9 g/dL (ref 13.0–17.0)
LYMPHS PCT: 28.7 % (ref 12.0–46.0)
Lymphs Abs: 1.5 10*3/uL (ref 0.7–4.0)
MCHC: 34.1 g/dL (ref 30.0–36.0)
MCV: 95.7 fl (ref 78.0–100.0)
Monocytes Absolute: 0.5 10*3/uL (ref 0.1–1.0)
Monocytes Relative: 9 % (ref 3.0–12.0)
Neutro Abs: 3.1 10*3/uL (ref 1.4–7.7)
Neutrophils Relative %: 59.3 % (ref 43.0–77.0)
PLATELETS: 312 10*3/uL (ref 150.0–400.0)
RBC: 4.88 Mil/uL (ref 4.22–5.81)
RDW: 13.6 % (ref 11.5–15.5)
WBC: 5.2 10*3/uL (ref 4.0–10.5)

## 2018-05-28 LAB — POC URINALSYSI DIPSTICK (AUTOMATED)
Bilirubin, UA: NEGATIVE
Blood, UA: NEGATIVE
GLUCOSE UA: POSITIVE — AB
Ketones, UA: 5
LEUKOCYTES UA: NEGATIVE
NITRITE UA: NEGATIVE
Protein, UA: NEGATIVE
Spec Grav, UA: 1.015 (ref 1.010–1.025)
UROBILINOGEN UA: 0.2 U/dL
pH, UA: 6 (ref 5.0–8.0)

## 2018-05-28 NOTE — Progress Notes (Signed)
Taking tylenol for R sided abd pain.  H/o cholecystectomy.  It seems like the RUQ pain may coincide with back pain.  Back pain does radiate to the R side.  abd isn't ttp on his checking at home.   It seems like this is a pain that predates the gallbladder surgery.  He has some mild R back discomfort with twisting.    No fevers.  No vomiting.  No blood in stool.  He feels good o/w, when not having back or RUQ abd pain.  Pain on most days.  No sweats.  No blood in urine.    Zantac was on recall.  D/w pt.  He is off med but started taking pepcid with relief.  He did worse off H2 blocker with more heartburn.  TUMS helps.    Meds, vitals, and allergies reviewed.   ROS: Per HPI unless specifically indicated in ROS section   GEN: nad, alert and oriented HEENT: mucous membranes moist NECK: supple w/o LA CV: rrr PULM: ctab, no inc wob ABD: soft, +bs, not ttp, no rebound.  EXT: no edema SKIN: no acute rash Midline back not ttp No cva pain

## 2018-05-28 NOTE — Assessment & Plan Note (Signed)
I question if this is all related to his back (heat seems to help some and he has some sx with twisting his back) and not an abd process.  He has deg changes in the L spine, would check T spine today given the RUQ discomfort.   See notes on imaging.  Continue prn tylenol.  Check cmet and cbc and u/a today.  He agrees.   Okay for outpatient f/u.

## 2018-05-28 NOTE — Patient Instructions (Signed)
Go to the lab on the way out.  We'll contact you with your lab and xray report. Take tylenol for pain.  Take care.  Glad to see you.

## 2018-05-30 ENCOUNTER — Other Ambulatory Visit: Payer: Self-pay | Admitting: Family Medicine

## 2018-06-01 ENCOUNTER — Other Ambulatory Visit: Payer: Self-pay | Admitting: Family Medicine

## 2018-06-01 DIAGNOSIS — N189 Chronic kidney disease, unspecified: Secondary | ICD-10-CM

## 2018-06-06 ENCOUNTER — Other Ambulatory Visit (INDEPENDENT_AMBULATORY_CARE_PROVIDER_SITE_OTHER): Payer: Medicare Other

## 2018-06-06 DIAGNOSIS — N189 Chronic kidney disease, unspecified: Secondary | ICD-10-CM

## 2018-06-06 LAB — BASIC METABOLIC PANEL
BUN: 23 mg/dL (ref 6–23)
CALCIUM: 9.8 mg/dL (ref 8.4–10.5)
CO2: 27 mEq/L (ref 19–32)
CREATININE: 1.9 mg/dL — AB (ref 0.40–1.50)
Chloride: 102 mEq/L (ref 96–112)
GFR: 37.21 mL/min — AB (ref >60.00)
GLUCOSE: 130 mg/dL — AB (ref 70–99)
POTASSIUM: 4.2 meq/L (ref 3.5–5.1)
Sodium: 138 mEq/L (ref 135–145)

## 2018-07-17 ENCOUNTER — Ambulatory Visit (INDEPENDENT_AMBULATORY_CARE_PROVIDER_SITE_OTHER): Payer: Medicare Other | Admitting: Family Medicine

## 2018-07-17 ENCOUNTER — Encounter: Payer: Self-pay | Admitting: Family Medicine

## 2018-07-17 VITALS — BP 122/78 | HR 82 | Temp 98.5°F | Ht 65.25 in

## 2018-07-17 DIAGNOSIS — M549 Dorsalgia, unspecified: Secondary | ICD-10-CM

## 2018-07-17 DIAGNOSIS — E119 Type 2 diabetes mellitus without complications: Secondary | ICD-10-CM

## 2018-07-17 LAB — POCT GLYCOSYLATED HEMOGLOBIN (HGB A1C): HEMOGLOBIN A1C: 6.4 % — AB (ref 4.0–5.6)

## 2018-07-17 NOTE — Patient Instructions (Signed)
[facility]  Recheck in about 3 months.  The only lab you need to have done for your next diabetic visit is an A1c.  We can do this with a fingerstick test at the office visit.  You do not need a lab visit ahead of time for this.  It does not matter if you are fasting when the lab is done.   If you have more lows, then stop the glipizide and update me.  Take care.  Glad to see you.

## 2018-07-17 NOTE — Progress Notes (Signed)
Diabetes:  Using medications without difficulties:yes Hypoglycemic episodes: lowest was 79, corrected with a snack, he was aware of the episode.  Rare overall.  D/w pt about taper med if recurrent.   Hyperglycemic episodes:no Feet problems:no Blood Sugars averaging: usually 100-125 eye exam within last year: done 03/2018.   A1c d/w pt.  6.4, improved.    He has renal f/u pending.    He has arthritis and is putting up with episodic back pain that fluctuates.  [facility] and [facility].    Meds, vitals, and allergies reviewed.   ROS: Per HPI unless specifically indicated in ROS section   GEN: nad, alert and oriented HEENT: mucous membranes moist NECK: supple w/o LA CV: rrr. PULM: ctab, no inc wob ABD: soft, +bs EXT: no edema SKIN: well perfused.

## 2018-07-18 NOTE — Assessment & Plan Note (Signed)
He is going to check in at the [facility] and see about easing back and exercise.  He will update me as needed.  He avoids NSAIDs.

## 2018-07-18 NOTE — Assessment & Plan Note (Signed)
A1c d/w pt.  6.4, improved.   Continue work on diet and exercise.  See after visit summary. If having more lows, then stop the glipizide and update me.

## 2018-07-19 ENCOUNTER — Encounter: Payer: Self-pay | Admitting: Family Medicine

## 2018-07-26 DIAGNOSIS — E1129 Type 2 diabetes mellitus with other diabetic kidney complication: Secondary | ICD-10-CM | POA: Diagnosis not present

## 2018-07-26 DIAGNOSIS — N183 Chronic kidney disease, stage 3 (moderate): Secondary | ICD-10-CM | POA: Diagnosis not present

## 2018-08-23 ENCOUNTER — Other Ambulatory Visit: Payer: Self-pay | Admitting: Family Medicine

## 2018-10-08 ENCOUNTER — Other Ambulatory Visit: Payer: Self-pay | Admitting: Family Medicine

## 2018-10-08 DIAGNOSIS — E119 Type 2 diabetes mellitus without complications: Secondary | ICD-10-CM

## 2018-10-12 ENCOUNTER — Other Ambulatory Visit: Payer: Self-pay

## 2018-10-12 ENCOUNTER — Other Ambulatory Visit (INDEPENDENT_AMBULATORY_CARE_PROVIDER_SITE_OTHER): Payer: Medicare Other

## 2018-10-12 DIAGNOSIS — E119 Type 2 diabetes mellitus without complications: Secondary | ICD-10-CM | POA: Diagnosis not present

## 2018-10-12 LAB — POCT GLYCOSYLATED HEMOGLOBIN (HGB A1C): Hemoglobin A1C: 6.1 % — AB (ref 4.0–5.6)

## 2018-10-16 ENCOUNTER — Ambulatory Visit (INDEPENDENT_AMBULATORY_CARE_PROVIDER_SITE_OTHER): Payer: Medicare Other | Admitting: Family Medicine

## 2018-10-16 DIAGNOSIS — E119 Type 2 diabetes mellitus without complications: Secondary | ICD-10-CM

## 2018-10-16 MED ORDER — GLIPIZIDE 5 MG PO TABS: 2.5000 mg | ORAL_TABLET | Freq: Every day | ORAL | Status: DC

## 2018-10-16 MED ORDER — AMOXICILLIN 500 MG PO CAPS: 2000.0000 mg | ORAL_CAPSULE | Freq: Every day | ORAL | 1 refills | Status: DC

## 2018-10-16 NOTE — Progress Notes (Signed)
Virtual visit completed through WebEx or similar program Patient location: home  Provider location: [place] at [facility], office   Limitations and rationale for visit method d/w patient.  Patient agreed to proceed.   CC:  DM  HPI:  Pandemic considerations d/w pt.   He needed refill on amoxil for dental work.  Done at [place].  BP recently 118/71.    He is active and putting up with occ back pain, taking tylenol with some relief.    Diabetes:  Using medications without difficulties:yes Hypoglycemic episodes:rarely, down to the 90s.   Hyperglycemic episodes:no Feet problems:no Blood Sugars averaging: usually ~100 eye exam within last year: yes Labs d/w pt.  A1c stable at 6.1.   D/w pt about tapering glipizide to 2.5mg  a day.   He is working on diet and exercise.   Meds and allergies reviewed.   ROS: Per HPI unless specifically indicated in ROS section   NAD Speech wnl  A/P: Dm2.   Blood Sugars averaging: usually ~100 eye exam within last year: yes Labs d/w pt.  A1c stable at 6.1.   D/w pt about tapering glipizide to 2.5mg  a day.   He is working on diet and exercise.  Plan on recheck in about 3 months.  He agrees.

## 2018-10-17 NOTE — Assessment & Plan Note (Signed)
Blood Sugars averaging: usually ~100 eye exam within last year: yes Labs d/w pt.  A1c stable at 6.1.   D/w pt about tapering glipizide to 2.5mg  a day.   He is working on diet and exercise.  Plan on recheck in about 3 months.  He agrees.

## 2018-11-28 DIAGNOSIS — L820 Inflamed seborrheic keratosis: Secondary | ICD-10-CM | POA: Diagnosis not present

## 2018-11-28 DIAGNOSIS — L814 Other melanin hyperpigmentation: Secondary | ICD-10-CM | POA: Diagnosis not present

## 2018-11-28 DIAGNOSIS — L570 Actinic keratosis: Secondary | ICD-10-CM | POA: Diagnosis not present

## 2018-11-28 DIAGNOSIS — D229 Melanocytic nevi, unspecified: Secondary | ICD-10-CM | POA: Diagnosis not present

## 2018-11-28 DIAGNOSIS — D1800 Hemangioma unspecified site: Secondary | ICD-10-CM | POA: Diagnosis not present

## 2018-11-28 DIAGNOSIS — Z85828 Personal history of other malignant neoplasm of skin: Secondary | ICD-10-CM | POA: Diagnosis not present

## 2018-11-28 DIAGNOSIS — L821 Other seborrheic keratosis: Secondary | ICD-10-CM | POA: Diagnosis not present

## 2018-12-30 ENCOUNTER — Other Ambulatory Visit: Payer: Self-pay | Admitting: Family Medicine

## 2018-12-30 DIAGNOSIS — E119 Type 2 diabetes mellitus without complications: Secondary | ICD-10-CM

## 2018-12-31 ENCOUNTER — Other Ambulatory Visit: Payer: Self-pay | Admitting: Family Medicine

## 2019-01-03 ENCOUNTER — Other Ambulatory Visit: Payer: Self-pay

## 2019-01-03 ENCOUNTER — Other Ambulatory Visit (INDEPENDENT_AMBULATORY_CARE_PROVIDER_SITE_OTHER): Payer: Medicare Other

## 2019-01-03 DIAGNOSIS — E119 Type 2 diabetes mellitus without complications: Secondary | ICD-10-CM | POA: Diagnosis not present

## 2019-01-03 LAB — COMPREHENSIVE METABOLIC PANEL
ALT: 10 U/L (ref 0–53)
AST: 13 U/L (ref 0–37)
Albumin: 4.7 g/dL (ref 3.5–5.2)
Alkaline Phosphatase: 56 U/L (ref 39–117)
BUN: 27 mg/dL — ABNORMAL HIGH (ref 6–23)
CO2: 28 mEq/L (ref 19–32)
Calcium: 9.7 mg/dL (ref 8.4–10.5)
Chloride: 105 mEq/L (ref 96–112)
Creatinine, Ser: 1.79 mg/dL — ABNORMAL HIGH (ref 0.40–1.50)
GFR: 37.44 mL/min — ABNORMAL LOW (ref >60.00)
Glucose, Bld: 121 mg/dL — ABNORMAL HIGH (ref 70–99)
Potassium: 4.2 mEq/L (ref 3.5–5.1)
Sodium: 140 mEq/L (ref 135–145)
Total Bilirubin: 0.8 mg/dL (ref 0.2–1.2)
Total Protein: 6.7 g/dL (ref 6.0–8.3)

## 2019-01-03 LAB — LIPID PANEL
Cholesterol: 167 mg/dL (ref 0–200)
HDL: 56.9 mg/dL (ref >39.00)
LDL Cholesterol: 98 mg/dL (ref 0–99)
NonHDL: 110.46
Total CHOL/HDL Ratio: 3
Triglycerides: 64 mg/dL (ref 0.0–149.0)
VLDL: 12.8 mg/dL (ref 0.0–40.0)

## 2019-01-03 LAB — HEMOGLOBIN A1C: Hgb A1c MFr Bld: 6.2 % (ref 4.6–6.5)

## 2019-01-03 LAB — MICROALBUMIN / CREATININE URINE RATIO
Creatinine,U: 62.5 mg/dL
Microalb Creat Ratio: 1.4 mg/g (ref 0.0–30.0)
Microalb, Ur: 0.9 mg/dL (ref 0.0–1.9)

## 2019-01-04 ENCOUNTER — Other Ambulatory Visit: Payer: Self-pay

## 2019-01-04 ENCOUNTER — Ambulatory Visit (INDEPENDENT_AMBULATORY_CARE_PROVIDER_SITE_OTHER): Payer: Medicare Other

## 2019-01-04 ENCOUNTER — Ambulatory Visit: Payer: Medicare Other

## 2019-01-04 ENCOUNTER — Encounter: Payer: Self-pay | Admitting: Family Medicine

## 2019-01-04 ENCOUNTER — Ambulatory Visit (INDEPENDENT_AMBULATORY_CARE_PROVIDER_SITE_OTHER): Payer: Medicare Other | Admitting: Family Medicine

## 2019-01-04 VITALS — BP 122/72 | HR 90 | Temp 98.3°F | Ht 65.25 in | Wt 158.5 lb

## 2019-01-04 VITALS — Ht 65.5 in | Wt 158.8 lb

## 2019-01-04 DIAGNOSIS — Z0000 Encounter for general adult medical examination without abnormal findings: Secondary | ICD-10-CM

## 2019-01-04 DIAGNOSIS — N189 Chronic kidney disease, unspecified: Secondary | ICD-10-CM

## 2019-01-04 DIAGNOSIS — E119 Type 2 diabetes mellitus without complications: Secondary | ICD-10-CM

## 2019-01-04 DIAGNOSIS — M720 Palmar fascial fibromatosis [Dupuytren]: Secondary | ICD-10-CM | POA: Diagnosis not present

## 2019-01-04 NOTE — Patient Instructions (Addendum)
Stop the glipizide for now and keep a check on your sugar.   Restart if you have severe sugar elevations.  Update me as needed.   Recheck in about 3 months.  A1c at the visit.  Take care.  Glad to see you.  Thanks for your effort.

## 2019-01-04 NOTE — Progress Notes (Signed)
Subjective:   Guy [name] is a 73 y.o. male who presents for Medicare Annual/Subsequent preventive examination.  This visit type was conducted due to national recommendations for restrictions regarding the COVID-19 Pandemic (e.g. social distancing). This format is felt to be most appropriate for this patient at this time. All issues noted in this document were discussed and addressed. No physical exam was performed (except for noted visual exam findings with Video Visits). This patient,Guy [name], has given permission to perform this visit via telephone. Vital signs may be absent or patient reported.  Patient location:  At home  Nurse location:  At home     Review of Systems:  n/a Cardiac Risk Factors include: advanced age (>40mn, >>10women);diabetes mellitus;male gender     Objective:    Vitals: Ht 5' 5.5" (1.664 m) Comment: per patient  Wt 158 lb 12.8 oz (72 kg) Comment: per patient  BMI 26.02 kg/m   Body mass index is 26.02 kg/m.  Advanced Directives 01/04/2019 02/16/2018 02/08/2018 01/01/2018 12/06/2016 01/07/2016 01/07/2016  Does Patient Have a Medical Advance Directive? Yes Yes Yes Yes Yes Yes Yes  Type of AParamedicof AAspen ParkLiving will H[place]Living will - HChicagoLiving will HBriarcliffe AcresLiving will HCharitonLiving will HWy[place]Living will  Does patient want to make changes to medical advance directive? No - Patient declined No - Patient declined Yes (MAU/Ambulatory/Procedural Areas - Information given) Yes (MAU/Ambulatory/Procedural Areas - Information given) - Yes - information given Yes - information given  Copy of [place] Chart? No - copy requested Yes - No - copy requested No - copy requested - -  Would patient like information on creating a medical advance directive? - - - - - - -    Tobacco Social History    Tobacco Use  Smoking Status Never Smoker  Smokeless Tobacco Never Used     Counseling given: Not Answered   Clinical Intake:  Pre-visit preparation completed: Yes  Pain : No/denies pain     Nutritional Status: BMI 25 -29 Overweight Nutritional Risks: None Diabetes: Yes CBG done?: No Did pt. bring in CBG monitor from home?: No  How often do you need to have someone help you when you read instructions, pamphlets, or other written materials from your doctor or pharmacy?: 1 - Never What is the last grade level you completed in school?: some college  Interpreter Needed?: No  Information entered by :: NAllen LPN  Past Medical History:  Diagnosis Date  . Anxiety    not clinical  . Arthritis   . Cataract   . Chronic kidney disease   . Diabetes mellitus type II    oral meds  . Difficult intubation   . Elevated PSA   . GERD (gastroesophageal reflux disease)   . History of basal cell carcinoma excision    1990's--  moh's surg. of nose  . History of hiatal hernia   . Neck complaint    "stiffness, catches"-denies numbness, tingling of arms  . Prostate cancer ([facility] 2016  . Wears contact lenses    Past Surgical History:  Procedure Laterality Date  . CHOLECYSTECTOMY N/A 02/16/2018   Procedure: LAPAROSCOPIC CHOLECYSTECTOMY;  Surgeon: DVickie Epley MD;  Location: [facility];  Service: General;  Laterality: N/A;  . COLONOSCOPY  06-01-2011  . COLONOSCOPY WITH PROPOFOL N/A 12/06/2016   Procedure: COLONOSCOPY WITH PROPOFOL;  Surgeon: WLucilla Lame MD;  Location:  [facility];  Service: Endoscopy;  Laterality: N/A;  . ESOPHAGOGASTRODUODENOSCOPY  07/29/05   [facility]  . JOINT REPLACEMENT    . LYMPHADENECTOMY N/A 05/06/2015   Procedure: LYMPH NODE DISSECTION;  Surgeon: Alexis Frock, MD;  Location: [facility];  Service: Urology;  Laterality: N/A;  . MOHS SURGERY  1990's   nose  . PROSTATE BIOPSY  2014  . PROSTATE BIOPSY N/A 03/06/2015   Procedure: SATURATION BIOPSY TRANSRECTAL  ULTRASONIC PROSTATE (TUBP);  Surgeon: Alexis Frock, MD;  [facility];  Service: Urology;  Laterality: N/A;  . ROBOT ASSISTED LAPAROSCOPIC RADICAL PROSTATECTOMY N/A 05/06/2015   Procedure: ROBOTIC ASSISTED LAPAROSCOPIC RADICAL PROSTATECTOMY;  Surgeon: Alexis Frock, MD;  Location: [facility];  Service: Urology;  Laterality: N/A;  . TONSILLECTOMY  as a child  . TOTAL HIP ARTHROPLASTY Right 08/ 2000   Family History  Problem Relation Age of Onset  . COPD Mother        smoker  . Cancer Mother        unknown primary  . Alzheimer's disease Father   . Hypertension Father   . Diabetes Father   . Obesity Brother   . Hypertension Brother   . Diabetes Brother   . Heart disease Brother        heart valve  . Prostate cancer Neg Hx   . Colon cancer Neg Hx    Social History   Socioeconomic History  . Marital status: Widowed    Spouse name: Not on file  . Number of children: 1  . Years of education: Not on file  . Highest education level: Not on file  Occupational History  . 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 Financial resource strain: Not hard at all  . Food insecurity    Worry: Never true    Inability: Never true  . Transportation needs    Medical: No    Non-medical: No  Tobacco Use  . Smoking status: Never Smoker  . Smokeless tobacco: Never Used  Substance and Sexual Activity  . Alcohol use: Not Currently    Alcohol/week: 0.0 standard drinks  . Drug use: No  . Sexual activity: Not Currently  Lifestyle  . Physical activity    Days per week: 0 days    Minutes per session: 0 min  . Stress: To some extent  Relationships  . Social Herbalist on phone: Not on file    Gets together: Not on file    Attends religious service: Not on file    Active member of club or organization: Not on file    Attends meetings of clubs or organizations: Not on file    Relationship status: Not on  file  Other Topics Concern  . Not on file  Social History Narrative   [employer] 2 years, E5 ([place] '69-71)   Married 1980, widowed 2014   1 step daughter    Elaina Hoops   Retired from [employer] Encounter Medications as of 01/04/2019  Medication Sig  . acetaminophen (TYLENOL) 500 MG tablet Take 1,000 mg by mouth 2 (two) times daily.   Marland Kitchen amoxicillin (AMOXIL) 500 MG capsule Take 4 capsules (2,000 mg total) by mouth daily. Before dental work.  . Blood Glucose Monitoring Suppl (ONETOUCH VERIO) w/Device KIT 1 kit by Other route daily. Use as instructed to check blood sugar once daily and as needed.  Diagnosis:  E11.9  Non insulin dependent.  . famotidine (PEPCID) 10 MG tablet Take 10 mg by mouth daily.  Marland Kitchen glipiZIDE (GLUCOTROL) 5 MG tablet Take 0.5-1 tablets (2.5-5 mg total) by mouth daily before breakfast.  . neomycin-bacitracin-polymyxin (NEOSPORIN) ointment Apply 1 application topically daily as needed for wound care.  Glory Rosebush Delica Lancets 26S MISC USE TO CHECK BLOOD SUGAR DAILY  . ONETOUCH VERIO test strip CHECK BLOOD GLUCOSE ONCE DAILY AS DIRECTED   No facility-administered encounter medications on file as of 01/04/2019.     Activities of Daily Living In your present state of health, do you have any difficulty performing the following activities: 01/04/2019 02/16/2018  Hearing? N N  Vision? N N  Difficulty concentrating or making decisions? N N  Walking or climbing stairs? N N  Dressing or bathing? N N  Doing errands, shopping? N -  Preparing Food and eating ? N -  Using the Toilet? N -  In the past six months, have you accidently leaked urine? N -  Do you have problems with loss of bowel control? N -  Managing your Medications? N -  Managing your Finances? N -  Housekeeping or managing your Housekeeping? N -  Some recent data might be hidden    Patient Care Team: Tonia Ghent, MD as PCP - General (Family Medicine) Romine, Richard Miu, DO as Referring Physician (Optometry)  Alexis Frock, MD as Consulting Physician (Urology)   Assessment:   This is a routine wellness examination for Guy [name].  Exercise Activities and Dietary recommendations Current Exercise Habits: The patient does not participate in regular exercise at present(Keeps active with yard work and projects around the house)  Goals    . Increase physical activity     Starting 01/07/2016, I will continue to exercise for at least 60 min 4 days per week.     . Increase physical activity     Starting 01/01/2018, I will continue to walk at least 2 miles 2 days per week and to do garden work as needed.     . Patient Stated     Starting 01/04/2019 wants to start walking again and join [facility] once able.       Fall Risk Fall Risk  01/04/2019 01/22/2018 01/01/2018 01/07/2016 01/07/2016  Falls in the past year? 0 No No No No  Comment - Emmi Telephone Survey: data to providers prior to load - - -  Number falls in past yr: - - - - -  Injury with Fall? - - - - -  Risk for fall due to : Medication side effect - - - -  Follow up Falls evaluation completed;Falls prevention discussed - - - -   Is the patient's home free of loose throw rugs in walkways, pet beds, electrical cords, etc?   yes      Grab bars in the bathroom? no      Handrails on the stairs?   yes      Adequate lighting?   yes  Timed Get Up and Go Performed: n/a  Depression Screen PHQ 2/9 Scores 01/04/2019 01/01/2018 01/07/2016 01/07/2016  PHQ - 2 Score 0 0 0 0  PHQ- 9 Score 0 0 - -    Cognitive Function MMSE - Mini Mental State Exam 01/04/2019 01/01/2018 01/07/2016  Orientation to time '4 5 5  '$ Orientation to Place '5 5 5  '$ Registration '3 3 3  '$ Attention/ Calculation 5 0 0  Recall '3 2 3  '$ Recall-comments - unable to  recall 1 of 3 words -  Language- name 2 objects 0 0 0  Language- repeat '1 1 1  '$ Language- follow 3 step command 0 3 3  Language- read & follow direction 0 0 0  Write a sentence 0 0 0  Copy design 0 0 0  Total score '21 19 20    '$ Mini  Cog  Mini-Cog screen was completed. Maximum score is 22. A value of 0 denotes this part of the MMSE was not completed or the patient failed this part of the Mini-Cog screening.       Immunization History  Administered Date(s) Administered  . Influenza Split 06/24/2011  . Influenza Whole 05/09/2007, 04/27/2009  . Influenza, High Dose Seasonal PF 07/10/2017  . Influenza, Seasonal, Injecte, Preservative Fre 06/25/2012  . Influenza,inj,Quad PF,6+ Mos 04/11/2018  . Influenza-Unspecified 06/24/2014, 03/21/2015, 06/16/2016  . Pneumococcal Conjugate-13 09/09/2014  . Pneumococcal Polysaccharide-23 05/20/1997, 06/24/2011  . Td 03/12/2001, 06/24/2011  . Zoster 07/24/2006    Qualifies for Shingles Vaccine? yes  Screening Tests Health Maintenance  Topic Date Due  . FOOT EXAM  01/10/2019  . INFLUENZA VACCINE  01/19/2019  . OPHTHALMOLOGY EXAM  02/01/2019  . HEMOGLOBIN A1C  07/06/2019  . URINE MICROALBUMIN  01/03/2020  . TETANUS/TDAP  06/23/2021  . COLONOSCOPY  12/06/2021  . Hepatitis C Screening  Completed  . PNA vac Low Risk Adult  Completed   Cancer Screenings: Lung: Low Dose CT Chest recommended if Age 11-80 years, 30 pack-year currently smoking OR have quit w/in 15years. Patient does not qualify. Colorectal: up to date  Additional Screenings:  Hepatitis C Screening:12/2015      Plan:    Patient wants to start walking again and join the Y once able.  I have personally reviewed and noted the following in the patient's chart:   . Medical and social history . Use of alcohol, tobacco or illicit drugs  . Current medications and supplements . Functional ability and status . Nutritional status . Physical activity . Advanced directives . List of other physicians . Hospitalizations, surgeries, and ER visits in previous 12 months . Vitals . Screenings to include cognitive, depression, and falls . Referrals and appointments  In addition, I have reviewed and discussed with  patient certain preventive protocols, quality metrics, and best practice recommendations. A written personalized care plan for preventive services as well as general preventive health recommendations were provided to patient.     Kellie Simmering, LPN  [id number]

## 2019-01-04 NOTE — Progress Notes (Signed)
PCP notes:  Health Maintenance:  No gaps  Abnormal Screenings:  None  Patient concerns:  None  Nurse concerns:  None  Next PCP appt.: 01/04/2019 at 12:15

## 2019-01-04 NOTE — Progress Notes (Signed)
CKD.  Cr improved.  dw pt.  Not on nsaids.  He is drinking adequate water.   MALB wnl.   Diabetes:  Using medications without difficulties: yes Hypoglycemic episodes: one episode down to 77, cautions d/w pt.  Resolved with snack.   Hyperglycemic episodes: no Feet problems:no Blood Sugars averaging: usually ~100 eye exam within last year: yes He is taking 1/2 tab glipizide at baseline.  D/w pt about stopping med and monitoring.  He agrees.   He is working on diet and exercise.   Lipids d/w pt. Statin intolerant.   He has bilateral Dupuytren's contractures noted and we talked about referral to the hand clinic.  Meds, vitals, and allergies reviewed.  ROS: Per HPI unless specifically indicated in ROS section   GEN: nad, alert and oriented HEENT: ncat NECK: supple w/o LA CV: rrr. PULM: ctab, no inc wob ABD: soft, +bs EXT: no edema SKIN: no acute rash Bilateral Dupuytren contractures noted in the hands.  Diabetic foot exam: Normal inspection No skin breakdown No calluses  Normal DP pulses Normal sensation to light touch and monofilament Nails normal

## 2019-01-04 NOTE — Patient Instructions (Signed)
Guy [name] , Thank you for taking time to come for your Medicare Wellness Visit. I appreciate your ongoing commitment to your health goals. Please review the following plan we discussed and let me know if I can assist you in the future.   Screening recommendations/referrals: Colonoscopy: 11/2016 Recommended yearly ophthalmology/optometry visit for glaucoma screening and checkup Recommended yearly dental visit for hygiene and checkup  Vaccinations: Influenza vaccine: 03/2018 Pneumococcal vaccine: 08/2014 Tdap vaccine: 06/2011 Shingles vaccine: discussed    Advanced directives: Please bring a copy of your POA (Power of [place]) and/or Living Will to your next appointment.    Conditions/risks identified: overweight  Next appointment: 01/04/2019 at 12:15  Preventive Care 74 Years and Older, Male Preventive care refers to lifestyle choices and visits with your health care provider that can promote health and wellness. What does preventive care include?  A yearly physical exam. This is also called an annual well check.  Dental exams once or twice a year.  Routine eye exams. Ask your health care provider how often you should have your eyes checked.  Personal lifestyle choices, including:  Daily care of your teeth and gums.  Regular physical activity.  Eating a healthy diet.  Avoiding tobacco and drug use.  Limiting alcohol use.  Practicing safe sex.  Taking low doses of aspirin every day.  Taking vitamin and mineral supplements as recommended by your health care provider. What happens during an annual well check? The services and screenings done by your health care provider during your annual well check will depend on your age, overall health, lifestyle risk factors, and family history of disease. Counseling  Your health care provider may ask you questions about your:  Alcohol use.  Tobacco use.  Drug use.  Emotional well-being.  Home and relationship well-being.   Sexual activity.  Eating habits.  History of falls.  Memory and ability to understand (cognition).  Work and work Statistician. Screening  You may have the following tests or measurements:  Height, weight, and BMI.  Blood pressure.  Lipid and cholesterol levels. These may be checked every 5 years, or more frequently if you are over 62 years old.  Skin check.  Lung cancer screening. You may have this screening every year starting at age 32 if you have a 30-pack-year history of smoking and currently smoke or have quit within the past 15 years.  Fecal occult blood test (FOBT) of the stool. You may have this test every year starting at age 2.  Flexible sigmoidoscopy or colonoscopy. You may have a sigmoidoscopy every 5 years or a colonoscopy every 10 years starting at age 1.  Prostate cancer screening. Recommendations will vary depending on your family history and other risks.  Hepatitis C blood test.  Hepatitis B blood test.  Sexually transmitted disease (STD) testing.  Diabetes screening. This is done by checking your blood sugar (glucose) after you have not eaten for a while (fasting). You may have this done every 1-3 years.  Abdominal aortic aneurysm (AAA) screening. You may need this if you are a current or former smoker.  Osteoporosis. You may be screened starting at age 79 if you are at high risk. Talk with your health care provider about your test results, treatment options, and if necessary, the need for more tests. Vaccines  Your health care provider may recommend certain vaccines, such as:  Influenza vaccine. This is recommended every year.  Tetanus, diphtheria, and acellular pertussis (Tdap, Td) vaccine. You may need a Td booster every  10 years.  Zoster vaccine. You may need this after age 5.  Pneumococcal 13-valent conjugate (PCV13) vaccine. One dose is recommended after age 12.  Pneumococcal polysaccharide (PPSV23) vaccine. One dose is recommended after  age 31. Talk to your health care provider about which screenings and vaccines you need and how often you need them. This information is not intended to replace advice given to you by your health care provider. Make sure you discuss any questions you have with your health care provider. Document Released: 07/03/2015 Document Revised: 02/24/2016 Document Reviewed: 04/07/2015 Elsevier Interactive Patient Education  2017 [employer] Prevention in the Home Falls can cause injuries. They can happen to people of all ages. There are many things you can do to make your home safe and to help prevent falls. What can I do on the outside of my home?  Regularly fix the edges of walkways and driveways and fix any cracks.  Remove anything that might make you trip as you walk through a door, such as a raised step or threshold.  Trim any bushes or trees on the path to your home.  Use bright outdoor lighting.  Clear any walking paths of anything that might make someone trip, such as rocks or tools.  Regularly check to see if handrails are loose or broken. Make sure that both sides of any steps have handrails.  Any raised decks and porches should have guardrails on the edges.  Have any leaves, snow, or ice cleared regularly.  Use sand or salt on walking paths during winter.  Clean up any spills in your garage right away. This includes oil or grease spills. What can I do in the bathroom?  Use night lights.  Install grab bars by the toilet and in the tub and shower. Do not use towel bars as grab bars.  Use non-skid mats or decals in the tub or shower.  If you need to sit down in the shower, use a plastic, non-slip stool.  Keep the floor dry. Clean up any water that spills on the floor as soon as it happens.  Remove soap buildup in the tub or shower regularly.  Attach bath mats securely with double-sided non-slip rug tape.  Do not have throw rugs and other things on the floor that can  make you trip. What can I do in the bedroom?  Use night lights.  Make sure that you have a light by your bed that is easy to reach.  Do not use any sheets or blankets that are too big for your bed. They should not hang down onto the floor.  Have a firm chair that has side arms. You can use this for support while you get dressed.  Do not have throw rugs and other things on the floor that can make you trip. What can I do in the kitchen?  Clean up any spills right away.  Avoid walking on wet floors.  Keep items that you use a lot in easy-to-reach places.  If you need to reach something above you, use a strong step stool that has a grab bar.  Keep electrical cords out of the way.  Do not use floor polish or wax that makes floors slippery. If you must use wax, use non-skid floor wax.  Do not have throw rugs and other things on the floor that can make you trip. What can I do with my stairs?  Do not leave any items on the stairs.  Make sure  that there are handrails on both sides of the stairs and use them. Fix handrails that are broken or loose. Make sure that handrails are as long as the stairways.  Check any carpeting to make sure that it is firmly attached to the stairs. Fix any carpet that is loose or worn.  Avoid having throw rugs at the top or bottom of the stairs. If you do have throw rugs, attach them to the floor with carpet tape.  Make sure that you have a light switch at the top of the stairs and the bottom of the stairs. If you do not have them, ask someone to add them for you. What else can I do to help prevent falls?  Wear shoes that:  Do not have high heels.  Have rubber bottoms.  Are comfortable and fit you well.  Are closed at the toe. Do not wear sandals.  If you use a stepladder:  Make sure that it is fully opened. Do not climb a closed stepladder.  Make sure that both sides of the stepladder are locked into place.  Ask someone to hold it for you,  if possible.  Clearly mark and make sure that you can see:  Any grab bars or handrails.  First and last steps.  Where the edge of each step is.  Use tools that help you move around (mobility aids) if they are needed. These include:  Canes.  Walkers.  Scooters.  Crutches.  Turn on the lights when you go into a dark area. Replace any light bulbs as soon as they burn out.  Set up your furniture so you have a clear path. Avoid moving your furniture around.  If any of your floors are uneven, fix them.  If there are any pets around you, be aware of where they are.  Review your medicines with your doctor. Some medicines can make you feel dizzy. This can increase your chance of falling. Ask your doctor what other things that you can do to help prevent falls. This information is not intended to replace advice given to you by your health care provider. Make sure you discuss any questions you have with your health care provider. Document Released: 04/02/2009 Document Revised: 11/12/2015 Document Reviewed: 07/11/2014 Elsevier Interactive Patient Education  2017 [employer].

## 2019-01-06 DIAGNOSIS — M720 Palmar fascial fibromatosis [Dupuytren]: Secondary | ICD-10-CM | POA: Insufficient documentation

## 2019-01-06 NOTE — Assessment & Plan Note (Signed)
He has bilateral Dupuytren's contractures noted and we talked about referral to the hand clinic.

## 2019-01-06 NOTE — Assessment & Plan Note (Signed)
He is taking 1/2 tab glipizide at baseline.  D/w pt about stopping med and monitoring.  He agrees.   He is working on diet and exercise.   Lipids d/w pt. Statin intolerant.   Recheck A1c periodically.  Continue work on diet and exercise.  He agrees.

## 2019-01-06 NOTE — Assessment & Plan Note (Signed)
Cr improved.  dw pt.  Not on nsaids.  He is drinking adequate water.   MALB wnl.  He has follow-up pending with the renal clinic and will forward his lab/notes. >25 minutes spent in face to face time with patient, >50% spent in counselling or coordination of care.

## 2019-01-30 DIAGNOSIS — N183 Chronic kidney disease, stage 3 (moderate): Secondary | ICD-10-CM | POA: Diagnosis not present

## 2019-01-30 DIAGNOSIS — E1129 Type 2 diabetes mellitus with other diabetic kidney complication: Secondary | ICD-10-CM | POA: Diagnosis not present

## 2019-01-31 DIAGNOSIS — M65332 Trigger finger, left middle finger: Secondary | ICD-10-CM | POA: Diagnosis not present

## 2019-01-31 DIAGNOSIS — M720 Palmar fascial fibromatosis [Dupuytren]: Secondary | ICD-10-CM | POA: Diagnosis not present

## 2019-02-24 ENCOUNTER — Other Ambulatory Visit: Payer: Self-pay | Admitting: Family Medicine

## 2019-03-06 ENCOUNTER — Other Ambulatory Visit: Payer: Self-pay | Admitting: Family Medicine

## 2019-03-06 NOTE — Telephone Encounter (Signed)
Please check with patient.  I thought he had stopped the medication.  If he has back on it then please let me know (and please let me know about his sugars).  Thanks.

## 2019-03-06 NOTE — Telephone Encounter (Signed)
Electronic refill request Glipizide Last office visit 01/04/19 Medication no longer on list

## 2019-03-07 ENCOUNTER — Ambulatory Visit (INDEPENDENT_AMBULATORY_CARE_PROVIDER_SITE_OTHER): Payer: Medicare Other

## 2019-03-07 DIAGNOSIS — Z23 Encounter for immunization: Secondary | ICD-10-CM

## 2019-03-15 ENCOUNTER — Other Ambulatory Visit: Payer: Self-pay | Admitting: *Deleted

## 2019-03-27 DIAGNOSIS — L570 Actinic keratosis: Secondary | ICD-10-CM | POA: Diagnosis not present

## 2019-03-27 DIAGNOSIS — L814 Other melanin hyperpigmentation: Secondary | ICD-10-CM | POA: Diagnosis not present

## 2019-03-27 DIAGNOSIS — D1801 Hemangioma of skin and subcutaneous tissue: Secondary | ICD-10-CM | POA: Diagnosis not present

## 2019-03-27 DIAGNOSIS — L821 Other seborrheic keratosis: Secondary | ICD-10-CM | POA: Diagnosis not present

## 2019-03-27 DIAGNOSIS — L578 Other skin changes due to chronic exposure to nonionizing radiation: Secondary | ICD-10-CM | POA: Diagnosis not present

## 2019-03-27 DIAGNOSIS — Z85828 Personal history of other malignant neoplasm of skin: Secondary | ICD-10-CM | POA: Diagnosis not present

## 2019-04-04 DIAGNOSIS — N393 Stress incontinence (female) (male): Secondary | ICD-10-CM | POA: Diagnosis not present

## 2019-04-04 DIAGNOSIS — C61 Malignant neoplasm of prostate: Secondary | ICD-10-CM | POA: Diagnosis not present

## 2019-04-04 DIAGNOSIS — N5201 Erectile dysfunction due to arterial insufficiency: Secondary | ICD-10-CM | POA: Diagnosis not present

## 2019-04-09 ENCOUNTER — Ambulatory Visit (INDEPENDENT_AMBULATORY_CARE_PROVIDER_SITE_OTHER): Payer: Medicare Other | Admitting: Family Medicine

## 2019-04-09 ENCOUNTER — Encounter: Payer: Self-pay | Admitting: Family Medicine

## 2019-04-09 ENCOUNTER — Other Ambulatory Visit: Payer: Self-pay

## 2019-04-09 VITALS — BP 102/70 | HR 67 | Temp 97.1°F | Ht 65.25 in | Wt 152.1 lb

## 2019-04-09 DIAGNOSIS — E119 Type 2 diabetes mellitus without complications: Secondary | ICD-10-CM | POA: Diagnosis not present

## 2019-04-09 DIAGNOSIS — M720 Palmar fascial fibromatosis [Dupuytren]: Secondary | ICD-10-CM

## 2019-04-09 DIAGNOSIS — M1990 Unspecified osteoarthritis, unspecified site: Secondary | ICD-10-CM

## 2019-04-09 LAB — POCT GLYCOSYLATED HEMOGLOBIN (HGB A1C): Hemoglobin A1C: 7.1 % — AB (ref 4.0–5.6)

## 2019-04-09 MED ORDER — ONETOUCH DELICA LANCETS 33G MISC: 3 refills | Status: DC

## 2019-04-09 NOTE — Progress Notes (Signed)
Diabetes:  No meds.  Hypoglycemic episodes:no Hyperglycemic episodes:no Feet problems:no Blood Sugars averaging:  Max ~140 in the AMs, usually lower. eye exam within last year: due, f/u pending.   A1c d/w pt at OV.  Reasonable at 7.1, esp given his age and off med.  D/w pt.  He agrees.    He is putting up with his hand sx- he should be able to tolerate voltaren gel prn (not daily) but not oral nsaids, d/w pt.  He wanted to put off any procedures if possible.    Pandemic consideration d/w pt.    Meds, vitals, and allergies reviewed.  ROS: Per HPI unless specifically indicated in ROS section   GEN: nad, alert and oriented HEENT: ncat NECK: supple w/o LA CV: rrr. PULM: ctab, no inc wob ABD: soft, +bs EXT: no edema SKIN: no acute rash

## 2019-04-09 NOTE — Patient Instructions (Signed)
I would recheck A1c at a visit in about 3-4 months, assuming your didn't have significant elevations in the meantime.  Thanks for your effort.  Take care.  Glad to see you.

## 2019-04-10 NOTE — Assessment & Plan Note (Signed)
He is putting up with his hand sx- he should be able to tolerate voltaren gel prn (not daily) but not oral nsaids, d/w pt.  He wanted to put off any procedures if possible.

## 2019-04-10 NOTE — Assessment & Plan Note (Signed)
A1c d/w pt at OV.  Reasonable at 7.1, esp given his age and off med.  D/w pt.  He agrees.  Reasonable to recheck in 3-4 months assuming he does not have significant changes on his sugar checks at home.  He agrees with plan.  I thanked him for his effort.

## 2019-05-15 ENCOUNTER — Other Ambulatory Visit: Payer: Self-pay

## 2019-07-15 ENCOUNTER — Encounter: Payer: Self-pay | Admitting: Family Medicine

## 2019-07-15 ENCOUNTER — Ambulatory Visit (INDEPENDENT_AMBULATORY_CARE_PROVIDER_SITE_OTHER)
Admission: RE | Admit: 2019-07-15 | Discharge: 2019-07-15 | Disposition: A | Discharge status: Home / Self Care | Payer: Medicare Other | Source: Ambulatory Visit | Attending: Family Medicine | Admitting: Family Medicine

## 2019-07-15 ENCOUNTER — Ambulatory Visit (INDEPENDENT_AMBULATORY_CARE_PROVIDER_SITE_OTHER): Payer: Medicare Other | Admitting: Family Medicine

## 2019-07-15 ENCOUNTER — Other Ambulatory Visit: Payer: Self-pay

## 2019-07-15 VITALS — BP 142/82 | HR 91 | Temp 97.4°F | Ht 65.25 in | Wt 150.4 lb

## 2019-07-15 DIAGNOSIS — E119 Type 2 diabetes mellitus without complications: Secondary | ICD-10-CM

## 2019-07-15 DIAGNOSIS — M549 Dorsalgia, unspecified: Secondary | ICD-10-CM

## 2019-07-15 DIAGNOSIS — M5134 Other intervertebral disc degeneration, thoracic region: Secondary | ICD-10-CM | POA: Diagnosis not present

## 2019-07-15 LAB — POCT GLYCOSYLATED HEMOGLOBIN (HGB A1C): Hemoglobin A1C: 7 % — AB (ref 4.0–5.6)

## 2019-07-15 NOTE — Patient Instructions (Addendum)
Go to the lab on the way out.   If you have mychart we'll likely use that to update you.     Recheck in about 4 months. A1c at the visit.  Don't change your meds for now.

## 2019-07-15 NOTE — Progress Notes (Signed)
This visit occurred during the SARS-CoV-2 public health emergency.  Safety protocols were in place, including screening questions prior to the visit, additional usage of staff PPE, and extensive cleaning of exam room while observing appropriate contact time as indicated for disinfecting solutions.  Diabetes:  No meds.  Hypoglycemic episodes: no Hyperglycemic episodes:no Feet problems:no Blood Sugars averaging: usually ~120s.  eye exam within last year: due, d/w pt.  It was pushed back with pandemic.   A1c stable, d/w pt.  7.   covid vaccine d/w pt.   R sided back pain better with heat/shower.  When it happens it hurts, then resolves.  No sx now.  Some occ sx with turning.  No leg sx.    Meds, vitals, and allergies reviewed.  ROS: Per HPI unless specifically indicated in ROS section   GEN: nad, alert and oriented HEENT:ncat NECK: supple w/o LA CV: rrr. PULM: ctab, no inc wob ABD: soft, +bs EXT: no edema SKIN: well perfused.

## 2019-07-17 NOTE — Assessment & Plan Note (Signed)
A1c reasonable at 7.  Continue work on diet and exercise.  Recheck periodically.  He agrees.

## 2019-07-17 NOTE — Assessment & Plan Note (Signed)
I do not suspect an ominous diagnosis.  This sounds to be mechanical.  Reasonable to recheck plain films just to make sure there was not obvious pathology otherwise.  See notes on imaging.

## 2019-07-28 ENCOUNTER — Ambulatory Visit: Payer: Medicare Other | Attending: Internal Medicine

## 2019-07-28 DIAGNOSIS — Z23 Encounter for immunization: Secondary | ICD-10-CM

## 2019-07-28 NOTE — Progress Notes (Signed)
   Covid-19 Vaccination Clinic  Name:  Guy [name]    MRN: [id number] DOB: 1946/02/26  07/28/2019  [name] was observed post Covid-19 immunization for 15 minutes without incidence. He was provided with Vaccine Information Sheet and instruction to access the V-Safe system.   [name] was instructed to call 911 with any severe reactions post vaccine: Marland Kitchen Difficulty breathing  . Swelling of your face and throat  . A fast heartbeat  . A bad rash all over your body  . Dizziness and weakness    Immunizations Administered    Name Date Dose VIS Date Route   Pfizer COVID-19 Vaccine 07/28/2019  5:03 PM 0.3 mL 05/31/2019 Intramuscular   Manufacturer: [place]   Lot: [id number]   [place]: [id number]

## 2019-07-30 DIAGNOSIS — N183 Chronic kidney disease, stage 3 unspecified: Secondary | ICD-10-CM | POA: Diagnosis not present

## 2019-07-30 DIAGNOSIS — R81 Glycosuria: Secondary | ICD-10-CM | POA: Diagnosis not present

## 2019-07-30 DIAGNOSIS — E1129 Type 2 diabetes mellitus with other diabetic kidney complication: Secondary | ICD-10-CM | POA: Diagnosis not present

## 2019-08-06 DIAGNOSIS — E1129 Type 2 diabetes mellitus with other diabetic kidney complication: Secondary | ICD-10-CM | POA: Diagnosis not present

## 2019-08-06 DIAGNOSIS — N1832 Chronic kidney disease, stage 3b: Secondary | ICD-10-CM | POA: Insufficient documentation

## 2019-08-06 DIAGNOSIS — I10 Essential (primary) hypertension: Secondary | ICD-10-CM | POA: Diagnosis not present

## 2019-08-18 ENCOUNTER — Ambulatory Visit: Payer: Medicare Other

## 2019-08-22 ENCOUNTER — Ambulatory Visit: Payer: Medicare Other | Attending: Internal Medicine

## 2019-08-22 DIAGNOSIS — Z23 Encounter for immunization: Secondary | ICD-10-CM

## 2019-08-22 NOTE — Progress Notes (Signed)
   Covid-19 Vaccination Clinic  Name:  PAULUS BETKER    MRN: [id number] DOB: 05/31/1946  08/22/2019  Mr. Easterwood was observed post Covid-19 immunization for 15 minutes without incident. He was provided with Vaccine Information Sheet and instruction to access the V-Safe system.   Mr. Kolpack was instructed to call 911 with any severe reactions post vaccine: Marland Kitchen Difficulty breathing  . Swelling of face and throat  . A fast heartbeat  . A bad rash all over body  . Dizziness and weakness   Immunizations Administered    Name Date Dose VIS Date Route   Pfizer COVID-19 Vaccine 08/22/2019  1:27 PM 0.3 mL 05/31/2019 Intramuscular   Manufacturer: [place]   Lot: [id number]   [place]: [id number]

## 2019-10-22 DIAGNOSIS — H25013 Cortical age-related cataract, bilateral: Secondary | ICD-10-CM | POA: Diagnosis not present

## 2019-11-12 ENCOUNTER — Encounter: Payer: Self-pay | Admitting: Family Medicine

## 2019-11-12 ENCOUNTER — Other Ambulatory Visit: Payer: Self-pay

## 2019-11-12 ENCOUNTER — Ambulatory Visit (INDEPENDENT_AMBULATORY_CARE_PROVIDER_SITE_OTHER)
Admission: RE | Admit: 2019-11-12 | Discharge: 2019-11-12 | Disposition: A | Discharge status: Home / Self Care | Payer: Medicare Other | Source: Ambulatory Visit | Attending: Family Medicine | Admitting: Family Medicine

## 2019-11-12 ENCOUNTER — Ambulatory Visit (INDEPENDENT_AMBULATORY_CARE_PROVIDER_SITE_OTHER): Payer: Medicare Other | Admitting: Family Medicine

## 2019-11-12 VITALS — BP 120/60 | HR 71 | Temp 97.2°F | Ht 65.25 in | Wt 146.2 lb

## 2019-11-12 DIAGNOSIS — Z125 Encounter for screening for malignant neoplasm of prostate: Secondary | ICD-10-CM

## 2019-11-12 DIAGNOSIS — E119 Type 2 diabetes mellitus without complications: Secondary | ICD-10-CM

## 2019-11-12 DIAGNOSIS — R634 Abnormal weight loss: Secondary | ICD-10-CM

## 2019-11-12 LAB — TSH: TSH: 2.18 u[IU]/mL (ref 0.35–4.50)

## 2019-11-12 LAB — URINALYSIS, ROUTINE W REFLEX MICROSCOPIC
Bilirubin Urine: NEGATIVE
Hgb urine dipstick: NEGATIVE
Ketones, ur: NEGATIVE
Leukocytes,Ua: NEGATIVE
Nitrite: NEGATIVE
RBC / HPF: NONE SEEN (ref 0–?)
Specific Gravity, Urine: 1.02 (ref 1.000–1.030)
Total Protein, Urine: NEGATIVE
Urine Glucose: NEGATIVE
Urobilinogen, UA: 0.2 (ref 0.0–1.0)
pH: 5.5 (ref 5.0–8.0)

## 2019-11-12 LAB — COMPREHENSIVE METABOLIC PANEL
ALT: 11 U/L (ref 0–53)
AST: 15 U/L (ref 0–37)
Albumin: 4.8 g/dL (ref 3.5–5.2)
Alkaline Phosphatase: 73 U/L (ref 39–117)
BUN: 24 mg/dL — ABNORMAL HIGH (ref 6–23)
CO2: 28 mEq/L (ref 19–32)
Calcium: 9.9 mg/dL (ref 8.4–10.5)
Chloride: 103 mEq/L (ref 96–112)
Creatinine, Ser: 1.79 mg/dL — ABNORMAL HIGH (ref 0.40–1.50)
GFR: 37.35 mL/min — ABNORMAL LOW (ref >60.00)
Glucose, Bld: 141 mg/dL — ABNORMAL HIGH (ref 70–99)
Potassium: 4.3 mEq/L (ref 3.5–5.1)
Sodium: 139 mEq/L (ref 135–145)
Total Bilirubin: 0.8 mg/dL (ref 0.2–1.2)
Total Protein: 7 g/dL (ref 6.0–8.3)

## 2019-11-12 LAB — CBC WITH DIFFERENTIAL/PLATELET
Basophils Absolute: 0 10*3/uL (ref 0.0–0.1)
Basophils Relative: 0.9 % (ref 0.0–3.0)
Eosinophils Absolute: 0.1 10*3/uL (ref 0.0–0.7)
Eosinophils Relative: 2.4 % (ref 0.0–5.0)
HCT: 44 % (ref 39.0–52.0)
Hemoglobin: 15.2 g/dL (ref 13.0–17.0)
Lymphocytes Relative: 37.9 % (ref 12.0–46.0)
Lymphs Abs: 1.9 10*3/uL (ref 0.7–4.0)
MCHC: 34.4 g/dL (ref 30.0–36.0)
MCV: 96.4 fl (ref 78.0–100.0)
Monocytes Absolute: 0.5 10*3/uL (ref 0.1–1.0)
Monocytes Relative: 9.1 % (ref 3.0–12.0)
Neutro Abs: 2.5 10*3/uL (ref 1.4–7.7)
Neutrophils Relative %: 49.7 % (ref 43.0–77.0)
Platelets: 290 10*3/uL (ref 150.0–400.0)
RBC: 4.57 Mil/uL (ref 4.22–5.81)
RDW: 13.7 % (ref 11.5–15.5)
WBC: 5.1 10*3/uL (ref 4.0–10.5)

## 2019-11-12 LAB — PSA, MEDICARE: PSA: 0 ng/ml — ABNORMAL LOW (ref 0.10–4.00)

## 2019-11-12 LAB — POCT GLYCOSYLATED HEMOGLOBIN (HGB A1C): Hemoglobin A1C: 6.5 % — AB (ref 4.0–5.6)

## 2019-11-12 NOTE — Patient Instructions (Addendum)
Don't change your meds for now.  Update me as needed. Go to the lab on the way out.   If you have mychart we'll likely use that to update you.    Plan on a physical/yearly visit in about 3 months.  Take care.  Glad to see you.

## 2019-11-12 NOTE — Progress Notes (Signed)
This visit occurred during the SARS-CoV-2 public health emergency.  Safety protocols were in place, including screening questions prior to the visit, additional usage of staff PPE, and extensive cleaning of exam room while observing appropriate contact time as indicated for disinfecting solutions.  Diabetes:  No meds.   Hypoglycemic episodes:no Hyperglycemic episodes: no Feet problems:no Blood Sugars averaging: ~130s.   eye exam within last year: done recently.   A1c d/w pt, 6.5.  He has continued to lose weight.  Some of that can be attributed to diet changes, esp since stopping glipizide.  He doesn't feel unwell.  No FCNAVD.  No night sweats.  No blood in urine, no blood in stool.    Meds, vitals, and allergies reviewed.   ROS: Per HPI unless specifically indicated in ROS section   GEN: nad, alert and oriented HEENT: ncat NECK: supple w/o LA CV: rrr. PULM: ctab, no inc wob ABD: soft, +bs EXT: no edema SKIN: no acute rash

## 2019-11-13 DIAGNOSIS — R634 Abnormal weight loss: Secondary | ICD-10-CM | POA: Insufficient documentation

## 2019-11-13 NOTE — Assessment & Plan Note (Signed)
This could be due to diet changes but it still makes sense to check basic labs and chest x-ray.  See notes on imaging.  He still feels well.  Okay for outpatient follow-up.

## 2019-11-13 NOTE — Assessment & Plan Note (Signed)
No change in meds at this point.  Recheck periodically.  He agrees.  See after visit summary.

## 2020-01-26 ENCOUNTER — Other Ambulatory Visit: Payer: Self-pay | Admitting: Family Medicine

## 2020-01-26 DIAGNOSIS — E119 Type 2 diabetes mellitus without complications: Secondary | ICD-10-CM

## 2020-02-05 ENCOUNTER — Other Ambulatory Visit: Payer: Self-pay

## 2020-02-05 ENCOUNTER — Encounter: Payer: Self-pay | Admitting: Dermatology

## 2020-02-05 ENCOUNTER — Ambulatory Visit (INDEPENDENT_AMBULATORY_CARE_PROVIDER_SITE_OTHER): Payer: Medicare Other | Admitting: Dermatology

## 2020-02-05 DIAGNOSIS — L814 Other melanin hyperpigmentation: Secondary | ICD-10-CM | POA: Diagnosis not present

## 2020-02-05 DIAGNOSIS — L821 Other seborrheic keratosis: Secondary | ICD-10-CM

## 2020-02-05 DIAGNOSIS — L570 Actinic keratosis: Secondary | ICD-10-CM | POA: Diagnosis not present

## 2020-02-05 DIAGNOSIS — Z1283 Encounter for screening for malignant neoplasm of skin: Secondary | ICD-10-CM

## 2020-02-05 DIAGNOSIS — L578 Other skin changes due to chronic exposure to nonionizing radiation: Secondary | ICD-10-CM | POA: Diagnosis not present

## 2020-02-05 DIAGNOSIS — Z85828 Personal history of other malignant neoplasm of skin: Secondary | ICD-10-CM

## 2020-02-05 DIAGNOSIS — D1800 Hemangioma unspecified site: Secondary | ICD-10-CM

## 2020-02-05 DIAGNOSIS — D229 Melanocytic nevi, unspecified: Secondary | ICD-10-CM | POA: Diagnosis not present

## 2020-02-05 NOTE — Progress Notes (Signed)
   Follow-Up Visit   Subjective  Guy [name] is a 74 y.o. male who presents for the following: full body skin exam and skin cancer screening  Patient presents today for annual TBSE, has a concern about spots on left side of face. Patient has a h/o BCC on R. Nasal Ala, R temple, and nasal dorsum with a h/o Mohs all prior to being a patient at this practice.  The following portions of the chart were reviewed this encounter and updated as appropriate:  Tobacco  Allergies  Meds  Problems  Med Hx  Surg Hx  Fam Hx      Review of Systems:  No other skin or systemic complaints except as noted in HPI or Assessment and Plan.  Objective  Well appearing patient in no apparent distress; mood and affect are within normal limits.  A full examination was performed including scalp, head, eyes, ears, nose, lips, neck, chest, axillae, abdomen, back, buttocks, bilateral upper extremities, bilateral lower extremities, hands, feet, fingers, toes, fingernails, and toenails. All findings within normal limits unless otherwise noted below.  Objective  Right Cheek x 2 (2), Right Temple: Erythematous thin papules/macules with gritty scale.  Assessment & Plan  AK (actinic keratosis) (3) Right Temple; Right Cheek x 2 (2)  Destruction of lesion - Right Cheek x 2, Right Temple  Destruction method: cryotherapy   Informed consent: discussed and consent obtained   Lesion destroyed using liquid nitrogen: Yes   Cryotherapy cycles:  2 Outcome: patient tolerated procedure well with no complications   Post-procedure details: wound care instructions given     Lentigines - Scattered tan macules - Discussed due to sun exposure - Benign, observe - Call for any changes  Seborrheic Keratoses - Stuck-on, waxy, tan-brown papules and plaques  - Discussed benign etiology and prognosis. - Observe - Call for any changes  Melanocytic Nevi - Tan-brown and/or pink-flesh-colored symmetric macules and papules -  Benign appearing on exam today - Observation - Call clinic for new or changing moles - Recommend daily use of broad spectrum spf 30+ sunscreen to sun-exposed areas.   Hemangiomas - Red papules - Discussed benign nature - Observe - Call for any changes  Actinic Damage - diffuse scaly erythematous macules with underlying dyspigmentation - Recommend daily broad spectrum sunscreen SPF 30+ to sun-exposed areas, reapply every 2 hours as needed.  - Call for new or changing lesions.  History of Basal Cell Carcinoma of the Skin - No evidence of recurrence today - Recommend regular full body skin exams - Recommend daily broad spectrum sunscreen SPF 30+ to sun-exposed areas, reapply every 2 hours as needed.  - Call if any new or changing lesions are noted between office visits  Skin cancer screening performed today.   Return in about 3 months (around 05/07/2020) for AK Follow up, 1 year TBSE.  I, Donzetta Kohut, CMA, am acting as scribe for Forest Gleason, MD .   Documentation: I have reviewed the above documentation for accuracy and completeness, and I agree with the above.  Forest Gleason, MD

## 2020-02-05 NOTE — Patient Instructions (Addendum)

## 2020-02-06 ENCOUNTER — Other Ambulatory Visit (INDEPENDENT_AMBULATORY_CARE_PROVIDER_SITE_OTHER): Payer: Medicare Other

## 2020-02-06 DIAGNOSIS — E119 Type 2 diabetes mellitus without complications: Secondary | ICD-10-CM

## 2020-02-06 LAB — BASIC METABOLIC PANEL
BUN: 23 mg/dL (ref 6–23)
CO2: 29 mEq/L (ref 19–32)
Calcium: 10 mg/dL (ref 8.4–10.5)
Chloride: 103 mEq/L (ref 96–112)
Creatinine, Ser: 1.87 mg/dL — ABNORMAL HIGH (ref 0.40–1.50)
GFR: 35.49 mL/min — ABNORMAL LOW (ref >60.00)
Glucose, Bld: 144 mg/dL — ABNORMAL HIGH (ref 70–99)
Potassium: 4.5 mEq/L (ref 3.5–5.1)
Sodium: 138 mEq/L (ref 135–145)

## 2020-02-06 LAB — HEMOGLOBIN A1C: Hgb A1c MFr Bld: 7.2 % — ABNORMAL HIGH (ref 4.6–6.5)

## 2020-02-07 ENCOUNTER — Ambulatory Visit (INDEPENDENT_AMBULATORY_CARE_PROVIDER_SITE_OTHER): Payer: Medicare Other

## 2020-02-07 DIAGNOSIS — Z0000 Encounter for general adult medical examination without abnormal findings: Secondary | ICD-10-CM

## 2020-02-07 NOTE — Patient Instructions (Addendum)
[name] , Thank you for taking time to come for your Medicare Wellness Visit. I appreciate your ongoing commitment to your health goals. Please review the following plan we discussed and let me know if I can assist you in the future.   Screening recommendations/referrals: Colonoscopy: Up to date, completed 12/06/2016, due 11/2021 Recommended yearly ophthalmology/optometry visit for glaucoma screening and checkup Recommended yearly dental visit for hygiene and checkup  Vaccinations: Influenza vaccine: Up to date, completed 02/05/2020, due 01/2021 Pneumococcal vaccine: Completed series Tdap vaccine: Up to date, completed 06/24/2011, due 06/2021 Shingles vaccine: due, check with your insurance regarding coverage   Covid-19: Completed series  Advanced directives: copy in chart  Conditions/risks identified: Diabetes, hypertension  Next appointment: Follow up in one year for your annual wellness visit.   Preventive Care 4 Years and Older, Male Preventive care refers to lifestyle choices and visits with your health care provider that can promote health and wellness. What does preventive care include?  A yearly physical exam. This is also called an annual well check.  Dental exams once or twice a year.  Routine eye exams. Ask your health care provider how often you should have your eyes checked.  Personal lifestyle choices, including:  Daily care of your teeth and gums.  Regular physical activity.  Eating a healthy diet.  Avoiding tobacco and drug use.  Limiting alcohol use.  Practicing safe sex.  Taking low doses of aspirin every day.  Taking vitamin and mineral supplements as recommended by your health care provider. What happens during an annual well check? The services and screenings done by your health care provider during your annual well check will depend on your age, overall health, lifestyle risk factors, and family history of disease. Counseling  Your health care  provider may ask you questions about your:  Alcohol use.  Tobacco use.  Drug use.  Emotional well-being.  Home and relationship well-being.  Sexual activity.  Eating habits.  History of falls.  Memory and ability to understand (cognition).  Work and work Statistician. Screening  You may have the following tests or measurements:  Height, weight, and BMI.  Blood pressure.  Lipid and cholesterol levels. These may be checked every 5 years, or more frequently if you are over 60 years old.  Skin check.  Lung cancer screening. You may have this screening every year starting at age 16 if you have a 30-pack-year history of smoking and currently smoke or have quit within the past 15 years.  Fecal occult blood test (FOBT) of the stool. You may have this test every year starting at age 81.  Flexible sigmoidoscopy or colonoscopy. You may have a sigmoidoscopy every 5 years or a colonoscopy every 10 years starting at age 58.  Prostate cancer screening. Recommendations will vary depending on your family history and other risks.  Hepatitis C blood test.  Hepatitis B blood test.  Sexually transmitted disease (STD) testing.  Diabetes screening. This is done by checking your blood sugar (glucose) after you have not eaten for a while (fasting). You may have this done every 1-3 years.  Abdominal aortic aneurysm (AAA) screening. You may need this if you are a current or former smoker.  Osteoporosis. You may be screened starting at age 70 if you are at high risk. Talk with your health care provider about your test results, treatment options, and if necessary, the need for more tests. Vaccines  Your health care provider may recommend certain vaccines, such as:  Influenza vaccine.  This is recommended every year.  Tetanus, diphtheria, and acellular pertussis (Tdap, Td) vaccine. You may need a Td booster every 10 years.  Zoster vaccine. You may need this after age 58.  Pneumococcal  13-valent conjugate (PCV13) vaccine. One dose is recommended after age 57.  Pneumococcal polysaccharide (PPSV23) vaccine. One dose is recommended after age 27. Talk to your health care provider about which screenings and vaccines you need and how often you need them. This information is not intended to replace advice given to you by your health care provider. Make sure you discuss any questions you have with your health care provider. Document Released: 07/03/2015 Document Revised: 02/24/2016 Document Reviewed: 04/07/2015 Elsevier Interactive Patient Education  2017 [address] Prevention in the Home Falls can cause injuries. They can happen to people of all ages. There are many things you can do to make your home safe and to help prevent falls. What can I do on the outside of my home?  Regularly fix the edges of walkways and driveways and fix any cracks.  Remove anything that might make you trip as you walk through a door, such as a raised step or threshold.  Trim any bushes or trees on the path to your home.  Use bright outdoor lighting.  Clear any walking paths of anything that might make someone trip, such as rocks or tools.  Regularly check to see if handrails are loose or broken. Make sure that both sides of any steps have handrails.  Any raised decks and porches should have guardrails on the edges.  Have any leaves, snow, or ice cleared regularly.  Use sand or salt on walking paths during winter.  Clean up any spills in your garage right away. This includes oil or grease spills. What can I do in the bathroom?  Use night lights.  Install grab bars by the toilet and in the tub and shower. Do not use towel bars as grab bars.  Use non-skid mats or decals in the tub or shower.  If you need to sit down in the shower, use a plastic, non-slip stool.  Keep the floor dry. Clean up any water that spills on the floor as soon as it happens.  Remove soap buildup in the  tub or shower regularly.  Attach bath mats securely with double-sided non-slip rug tape.  Do not have throw rugs and other things on the floor that can make you trip. What can I do in the bedroom?  Use night lights.  Make sure that you have a light by your bed that is easy to reach.  Do not use any sheets or blankets that are too big for your bed. They should not hang down onto the floor.  Have a firm chair that has side arms. You can use this for support while you get dressed.  Do not have throw rugs and other things on the floor that can make you trip. What can I do in the kitchen?  Clean up any spills right away.  Avoid walking on wet floors.  Keep items that you use a lot in easy-to-reach places.  If you need to reach something above you, use a strong step stool that has a grab bar.  Keep electrical cords out of the way.  Do not use floor polish or wax that makes floors slippery. If you must use wax, use non-skid floor wax.  Do not have throw rugs and other things on the floor that can make  you trip. What can I do with my stairs?  Do not leave any items on the stairs.  Make sure that there are handrails on both sides of the stairs and use them. Fix handrails that are broken or loose. Make sure that handrails are as long as the stairways.  Check any carpeting to make sure that it is firmly attached to the stairs. Fix any carpet that is loose or worn.  Avoid having throw rugs at the top or bottom of the stairs. If you do have throw rugs, attach them to the floor with carpet tape.  Make sure that you have a light switch at the top of the stairs and the bottom of the stairs. If you do not have them, ask someone to add them for you. What else can I do to help prevent falls?  Wear shoes that:  Do not have high heels.  Have rubber bottoms.  Are comfortable and fit you well.  Are closed at the toe. Do not wear sandals.  If you use a stepladder:  Make sure that it  is fully opened. Do not climb a closed stepladder.  Make sure that both sides of the stepladder are locked into place.  Ask someone to hold it for you, if possible.  Clearly mark and make sure that you can see:  Any grab bars or handrails.  First and last steps.  Where the edge of each step is.  Use tools that help you move around (mobility aids) if they are needed. These include:  Canes.  Walkers.  Scooters.  Crutches.  Turn on the lights when you go into a dark area. Replace any light bulbs as soon as they burn out.  Set up your furniture so you have a clear path. Avoid moving your furniture around.  If any of your floors are uneven, fix them.  If there are any pets around you, be aware of where they are.  Review your medicines with your doctor. Some medicines can make you feel dizzy. This can increase your chance of falling. Ask your doctor what other things that you can do to help prevent falls. This information is not intended to replace advice given to you by your health care provider. Make sure you discuss any questions you have with your health care provider. Document Released: 04/02/2009 Document Revised: 11/12/2015 Document Reviewed: 07/11/2014 Elsevier Interactive Patient Education  2017 [employer].

## 2020-02-07 NOTE — Progress Notes (Signed)
PCP notes:  Health Maintenance: No gaps noted   Abnormal Screenings: none   Patient concerns: Trouble sleeping at night   Nurse concerns: none   Next PCP appt.: 02/13/2020 @ 8:30 am

## 2020-02-07 NOTE — Progress Notes (Signed)
Subjective:   Guy [name] is a 74 y.o. male who presents for Medicare Annual/Subsequent preventive examination.  Review of Systems: N/A      I connected with the patient today by telephone and verified that I am speaking with the correct person using two identifiers. Location patient: home Location nurse: work Persons participating in the virtual visit: patient, Marine scientist.   I discussed the limitations, risks, security and privacy concerns of performing an evaluation and management service by telephone and the availability of in person appointments. I also discussed with the patient that there may be a patient responsible charge related to this service. The patient expressed understanding and verbally consented to this telephonic visit.    Interactive audio and video telecommunications were attempted between this nurse and patient, however failed, due to patient having technical difficulties OR patient did not have access to video capability.  We continued and completed visit with audio only.     Cardiac Risk Factors include: advanced age (>41mn, >>15women);diabetes mellitus;hypertension;male gender     Objective:    Today's Vitals   There is no height or weight on file to calculate BMI.  Advanced Directives 02/07/2020 01/04/2019 02/16/2018 02/08/2018 01/01/2018 12/06/2016 01/07/2016  Does Patient Have a Medical Advance Directive? _0  Yes Yes  Type of AParamedicof ACedar HillLiving will HJames TownLiving will H[place]Living will - HLost Lake WoodsLiving will HCotopaxiLiving will HSea GirtLiving will  Does patient want to make changes to medical advance directive? - No - Patient declined No - Patient declined Yes (MAU/Ambulatory/Procedural Areas - Information given) Yes (MAU/Ambulatory/Procedural Areas - Information given) - Yes - information given  Copy of  HLancasterin Chart? Yes - validated most recent copy scanned in chart (See row information) No - copy requested Yes - No - copy requested No - copy requested -  Would patient like information on creating a medical advance directive? - - - - - - -    Current Medications (verified) Outpatient Encounter Medications as of 02/07/2020  Medication Sig  . acetaminophen (TYLENOL) 500 MG tablet Take 1,000 mg by mouth 2 (two) times daily.   . Blood Glucose Monitoring Suppl (ONETOUCH VERIO) w/Device KIT 1 kit by Other route daily. Use as instructed to check blood sugar once daily and as needed.  Diagnosis:  E11.9  Non insulin dependent.  . diclofenac sodium (VOLTAREN) 1 % GEL Apply topically 4 (four) times daily as needed.  . famotidine (PEPCID) 10 MG tablet Take 10 mg by mouth daily.  .Marland Kitchenlosartan (COZAAR) 25 MG tablet Take 25 mg by mouth daily.  . Multiple Vitamins-Minerals (PRESERVISION AREDS) TABS Take 1 tablet by mouth in the morning and at bedtime.  .Glory RosebushDelica Lancets 396VMISC USE TO CHECK BLOOD SUGAR DAILY  . ONETOUCH VERIO test strip CHECK BLOOD GLUCOSE ONCE DAILY AS DIRECTED   No facility-administered encounter medications on file as of 02/07/2020.    Allergies (verified) Nsaids and Pravastatin   History: Past Medical History:  Diagnosis Date  . Actinic keratosis   . Anxiety    not clinical  . Arthritis   . Basal cell carcinoma    Right nasal ala, right temple, nasal dorsum  . Cataract   . Chronic kidney disease   . Diabetes mellitus type II    oral meds  . Difficult intubation   . Elevated PSA   . GERD (  gastroesophageal reflux disease)   . History of basal cell carcinoma excision    1990's--  moh's surg. of nose  . History of hiatal hernia   . Neck complaint    "stiffness, catches"-denies numbness, tingling of arms  . Prostate cancer ([place]) 2016  . Wears contact lenses    Past Surgical History:  Procedure Laterality Date  . CHOLECYSTECTOMY N/A  02/16/2018   Procedure: LAPAROSCOPIC CHOLECYSTECTOMY;  Surgeon: Vickie Epley, MD;  Location: [facility] ORS;  Service: General;  Laterality: N/A;  . COLONOSCOPY  06-01-2011  . COLONOSCOPY WITH PROPOFOL N/A 12/06/2016   Procedure: COLONOSCOPY WITH PROPOFOL;  Surgeon: [name], MD;  [facility] END[facility]COPY;  Service: Endoscopy;  Laterality: N/A;  . ESOPHAGOGASTRODUODENOSCOPY  07/29/05   wnl H. H.  . JOINT REPLACEMENT    . LYMPHADENECTOMY N/A 05/06/2015   Procedure: LYMPH NODE DISSECTION;  Surgeon: Alexis Frock, MD;  Location: [facility] ORS;  Service: Urology;  Laterality: N/A;  . MOHS SURGERY  1990's   nose  . PROSTATE BIOPSY  2014  . PROSTATE BIOPSY N/A 03/06/2015   Procedure: SATURATION BIOPSY TRANSRECTAL ULTRASONIC PROSTATE (TUBP);  Surgeon: Alexis Frock, MD;  Location: [facility];  Service: Urology;  Laterality: N/A;  . ROBOT ASSISTED LAPAROSCOPIC RADICAL PROSTATECTOMY N/A 05/06/2015   Procedure: ROBOTIC ASSISTED LAPAROSCOPIC RADICAL PROSTATECTOMY;  Surgeon: Alexis Frock, MD;  Location: [facility] ORS;  Service: Urology;  Laterality: N/A;  . TONSILLECTOMY  as a child  . TOTAL HIP ARTHROPLASTY Right 08/ 2000   Family History  Problem Relation Age of Onset  . COPD Mother        smoker  . Cancer Mother        unknown primary  . Alzheimer's disease Father   . Hypertension Father   . Diabetes Father   . Obesity Brother   . Hypertension Brother   . Diabetes Brother   . Heart disease Brother        heart valve  . Prostate cancer Neg Hx   . Colon cancer Neg Hx    Social History   Socioeconomic History  . Marital status: Widowed    Spouse name: Not on file  . Number of children: 1  . Years of education: Not on file  . Highest education level: Not on file  Occupational History  . 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IT sales professional  Tobacco Use  . Smoking status: Never Smoker  . Smokeless tobacco: Never Used  Vaping Use  . Vaping Use:  Never used  Substance and Sexual Activity  . Alcohol use: Not Currently    Alcohol/week: 0.0 standard drinks  . Drug use: No  . Sexual activity: Not Currently  Other Topics Concern  . Not on file  Social History Narrative   [employer] 2 years, E5 ([place] '69-71)   Married 1980, widowed 2014   1 step daughter    Tourist information centre manager   Retired from [employer]: [place]   . Difficulty of Paying Living Expenses: Not hard at all  Food Insecurity: No Food Insecurity  . Worried About Charity fundraiser in the Last Year: Never true  . Ran Out of Food in the Last Year: Never true  Transportation Needs: No Transportation Needs  . Lack of Transportation (Medical): No  . Lack of Transportation (Non-Medical): No  Physical Activity: Inactive  . Days of Exercise per Week: 0 days  .  Minutes of Exercise per Session: 0 min  Stress: No Stress Concern Present  . Feeling of Stress : Not at all  Social Connections:   . Frequency of Communication with Friends and Family: Not on file  . Frequency of Social Gatherings with Friends and Family: Not on file  . Attends Religious Services: Not on file  . Active Member of Clubs or Organizations: Not on file  . Attends Archivist Meetings: Not on file  . Marital Status: Not on file    Tobacco Counseling Counseling given: Not Answered   Clinical Intake:  Pre-visit preparation completed: Yes  Pain : No/denies pain     Nutritional Risks: None Diabetes: Yes CBG done?: No Did pt. bring in CBG monitor from home?: No  How often do you need to have someone help you when you read instructions, pamphlets, or other written materials from your doctor or pharmacy?: 1 - Never What is the last grade level you completed in school?: some college  Diabetic: Yes Nutrition Risk Assessment:  Has the patient had any N/V/D within the last 2 months?  No  Does the patient have any non-healing wounds?  No  Has the  patient had any unintentional weight loss or weight gain?  No   Diabetes:  Is the patient diabetic?  Yes  If diabetic, was a CBG obtained today?  No  Did the patient bring in their glucometer from home?  N/A How often do you monitor your CBG's? daily.   Financial Strains and Diabetes Management:  Are you having any financial strains with the device, your supplies or your medication? N/A.  Does the patient want to be seen by Chronic Care Management for management of their diabetes?  N/A Would the patient like to be referred to a Nutritionist or for Diabetic Management?  N/A  Diabetic Exams:  Diabetic Eye Exam: Completed 01/07/2020 Diabetic Foot Exam: Overdue, Pt has been advised about the importance in completing this exam. Pt is scheduled for diabetic foot exam on 02/13/2020.   Interpreter Needed?: No  Information entered by :: CJohnson, LPN   Activities of Daily Living In your present state of health, do you have any difficulty performing the following activities: 02/07/2020  Hearing? N  Vision? N  Difficulty concentrating or making decisions? N  Walking or climbing stairs? N  Dressing or bathing? N  Doing errands, shopping? N  Preparing Food and eating ? N  Using the Toilet? N  In the past six months, have you accidently leaked urine? Y  Comment sometimes, had prostate surgery in the past  Do you have problems with loss of bowel control? N  Managing your Medications? N  Managing your Finances? N  Housekeeping or managing your Housekeeping? N  Some recent data might be hidden    Patient Care Team: Tonia Ghent, MD as PCP - General (Family Medicine) Joan Flores Richard Miu, DO as Referring Physician (Optometry) Alexis Frock, MD as Consulting Physician (Urology) Osa Craver, MD as Referring Physician (Urology)  Indicate any recent Medical Services you may have received from other than [facility] providers in the past year (date may be approximate).     Assessment:   This  is a routine wellness examination for Jarae.  Hearing/Vision screen  Hearing Screening   _0  _1  _2  _3  _4  _5  _6  _7  _8   Right ear:           Left ear:           Vision Screening  Comments: Patient gets annual eye exams   Dietary issues and exercise activities discussed: Exercise limited by: None identified  Goals    . Increase physical activity     Starting 01/07/2016, I will continue to exercise for at least 60 min 4 days per week.     . Increase physical activity     Starting 01/01/2018, I will continue to walk at least 2 miles 2 days per week and to do garden work as needed.     . Patient Stated     Starting 01/04/2019 wants to start walking again and join [facility] once able.    . Patient Stated     02/07/2020, I will continue to do yardwork and some weight lifting sometimes.       Depression Screen PHQ 2/9 Scores 02/07/2020 01/04/2019 01/01/2018 01/07/2016 01/07/2016 01/13/2015 01/08/2014  PHQ - 2 Score 0 0 0 0 0 0 0  PHQ- 9 Score 0 0 0 - - - -    Fall Risk Fall Risk  02/07/2020 05/15/2019 01/04/2019 01/22/2018 01/01/2018  Falls in the past year? 0 0 0 No No  Comment - Emmi Telephone Survey: data to providers prior to load - Emmi Telephone Survey: data to providers prior to load -  Number falls in past yr: 0 - - - -  Injury with Fall? 0 - - - -  Risk for fall due to : Medication side effect - Medication side effect - -  Follow up Falls evaluation completed;Falls prevention discussed - Falls evaluation completed;Falls prevention discussed - -    Any stairs in or around the home? Yes  If so, are there any without handrails? No  Home free of loose throw rugs in walkways, pet beds, electrical cords, etc? Yes  Adequate lighting in your home to reduce risk of falls? Yes   ASSISTIVE DEVICES UTILIZED TO PREVENT FALLS:  Life alert? No  Use of a cane, walker or w/c? No  Grab bars in the bathroom? No  Shower chair or bench in shower? No  Elevated toilet seat or  a handicapped toilet? No   TIMED UP AND GO:  Was the test performed? N/A, telephonic visit .    Cognitive Function: MMSE - Mini Mental State Exam 02/07/2020 01/04/2019 01/01/2018 01/07/2016  Orientation to time _0 Orientation to Place _1 Registration _2 Attention/ Calculation 5 5 0 0  Recall _3 Recall-comments - - unable to recall 1 of 3 words -  Language- name 2 objects - 0 0 0  Language- repeat _4 Language- follow 3 step command - 0 3 3  Language- read & follow direction - 0 0 0  Write a sentence - 0 0 0  Copy design - 0 0 0  Total score - _5 Mini Cog  Mini-Cog screen was completed. Maximum score is 22. A value of 0 denotes this part of the MMSE was not completed or the patient failed this part of the Mini-Cog screening.       Immunizations Immunization History  Administered Date(s) Administered  . Fluad Quad(high Dose 65+) 03/07/2019  . Influenza Split 06/24/2011  . Influenza Whole 05/09/2007, 04/27/2009  . Influenza, High Dose Seasonal PF 07/10/2017  . Influenza, Seasonal, Injecte, Preservative Fre 06/25/2012  . Influenza,inj,Quad PF,6+ Mos 04/11/2018  . Influenza-Unspecified 06/24/2014, 03/21/2015, 06/16/2016, 02/05/2020  . PFIZER SARS-COV-2 Vaccination 07/28/2019, 08/22/2019  .  Pneumococcal Conjugate-13 09/09/2014  . Pneumococcal Polysaccharide-23 05/20/1997, 06/24/2011  . Td 03/12/2001, 06/24/2011  . Zoster 07/24/2006    TDAP status: Up to date Flu Vaccine status: Up to date Pneumococcal vaccine status: Up to date Covid-19 vaccine status: Completed vaccines  Qualifies for Shingles Vaccine? Yes   Zostavax completed Yes   Shingrix Completed?: No.    Education has been provided regarding the importance of this vaccine. Patient has been advised to call insurance company to determine out of pocket expense if they have not yet received this vaccine. Advised may also receive vaccine at local pharmacy or Health Dept. Verbalized  acceptance and understanding.  Screening Tests Health Maintenance  Topic Date Due  . FOOT EXAM  01/04/2020  . HEMOGLOBIN A1C  08/08/2020  . OPHTHALMOLOGY EXAM  01/06/2021  . TETANUS/TDAP  06/23/2021  . COLONOSCOPY  12/06/2021  . INFLUENZA VACCINE  Completed  . COVID-19 Vaccine  Completed  . Hepatitis C Screening  Completed  . PNA vac Low Risk Adult  Completed    Health Maintenance  Health Maintenance Due  Topic Date Due  . FOOT EXAM  01/04/2020    Colorectal cancer screening: Completed 12/06/2016. Repeat every 5 years  Lung Cancer Screening: (Low Dose CT Chest recommended if Age 85-80 years, 30 pack-year currently smoking OR have quit w/in 15years.) does not qualify.   Additional Screening:  Hepatitis C Screening: does qualify; Completed 01/04/2016  Vision Screening: Recommended annual ophthalmology exams for early detection of glaucoma and other disorders of the eye. Is the patient up to date with their annual eye exam?  Yes  Who is the provider or what is the name of the office in which the patient attends annual eye exams? Dr. Lucianne Lei Romine If pt is not established with a provider, would they like to be referred to a provider to establish care? No .   Dental Screening: Recommended annual dental exams for proper oral hygiene  Community Resource Referral / Chronic Care Management: CRR required this visit?  No   CCM required this visit?  No      Plan:     I have personally reviewed and noted the following in the patient's chart:   . Medical and social history . Use of alcohol, tobacco or illicit drugs  . Current medications and supplements . Functional ability and status . Nutritional status . Physical activity . Advanced directives . List of other physicians . Hospitalizations, surgeries, and ER visits in previous 12 months . Vitals . Screenings to include cognitive, depression, and falls . Referrals and appointments  In addition, I have reviewed and  discussed with patient certain preventive protocols, quality metrics, and best practice recommendations. A written personalized care plan for preventive services as well as general preventive health recommendations were provided to patient.   Due to this being a telephonic visit, the after visit summary with patients personalized plan was offered to patient via mail or my-chart.  Patient preferred to pick up at office at next visit.   Silverio, Hagan, LPN   [id number]

## 2020-02-13 ENCOUNTER — Encounter: Payer: Self-pay | Admitting: Family Medicine

## 2020-02-13 ENCOUNTER — Ambulatory Visit (INDEPENDENT_AMBULATORY_CARE_PROVIDER_SITE_OTHER): Payer: Medicare Other | Admitting: Family Medicine

## 2020-02-13 ENCOUNTER — Other Ambulatory Visit: Payer: Self-pay

## 2020-02-13 VITALS — BP 122/66 | HR 71 | Temp 97.3°F | Ht 65.0 in | Wt 152.4 lb

## 2020-02-13 DIAGNOSIS — Z0000 Encounter for general adult medical examination without abnormal findings: Secondary | ICD-10-CM | POA: Diagnosis not present

## 2020-02-13 DIAGNOSIS — E119 Type 2 diabetes mellitus without complications: Secondary | ICD-10-CM | POA: Diagnosis not present

## 2020-02-13 DIAGNOSIS — N189 Chronic kidney disease, unspecified: Secondary | ICD-10-CM

## 2020-02-13 DIAGNOSIS — G4700 Insomnia, unspecified: Secondary | ICD-10-CM | POA: Diagnosis not present

## 2020-02-13 DIAGNOSIS — Z7189 Other specified counseling: Secondary | ICD-10-CM

## 2020-02-13 DIAGNOSIS — G720 Drug-induced myopathy: Secondary | ICD-10-CM

## 2020-02-13 MED ORDER — MELATONIN 5 MG PO CHEW: 5.0000 mg | CHEWABLE_TABLET | Freq: Every day | ORAL | Status: DC

## 2020-02-13 NOTE — Progress Notes (Signed)
This visit occurred during the SARS-CoV-2 public health emergency.  Safety protocols were in place, including screening questions prior to the visit, additional usage of staff PPE, and extensive cleaning of exam room while observing appropriate contact time as indicated for disinfecting solutions.  Insomnia.  We talked about stressors, covid restrictions, etc.  He is living alone and that affects his diet, sleep, etc.  He can get to sleep but then wakes in the meantime.  Usually goes to bed at 11 PM.  Getting up to urinate about 2x/night.  He had trouble getting back to sleep after that.  He has a comfortable bed.  He tried melatonin at night but hasn't tried it in the middle of the night.    Flu shot 2021 Shingles 2008 PNA prev done. Tetanus 2013 Colonoscopy 2018 Prostate cancer per uro. He has f/u pending.  Living will d/w pt. Would have [name] designated if patient were incapacitated.  He uses amoxil prior to dental visits.    He has urology fu pending, I'll defer.  He agrees.    CKD.  Still on losartan.  Cr stable.  D/w pt.  Avoids oral nsaids.    A1c 7.2.  Reasonable control.  No meds.  Statin intolerant.  Labs d/w pt.  Sugar usually controlled on home checks, ~110s.  Meds, vitals, and allergies reviewed.   ROS: Per HPI unless specifically indicated in ROS section   GEN: nad, alert and oriented HEENT: ncat NECK: supple w/o LA CV: rrr. PULM: ctab, no inc wob ABD: soft, +bs EXT: no edema SKIN: no acute rash  Diabetic foot exam: Normal inspection No skin breakdown No calluses  Normal DP pulses Normal sensation to light touch and monofilament Nails normal

## 2020-02-13 NOTE — Patient Instructions (Addendum)
Take care.  Glad to see you. Recheck A1c in about 4 months at a visit.   Update me as needed.  Thanks for your effort.

## 2020-02-16 DIAGNOSIS — G720 Drug-induced myopathy: Secondary | ICD-10-CM | POA: Insufficient documentation

## 2020-02-16 NOTE — Assessment & Plan Note (Signed)
Statin intolerant.  Continue work on diet and exercise. 

## 2020-02-16 NOTE — Assessment & Plan Note (Signed)
A1c 7.2.  Reasonable control.  No meds.  Statin intolerant.  Labs d/w pt.  Sugar usually controlled on home checks, ~110s.  Would continue as is.  He agrees.

## 2020-02-16 NOTE — Assessment & Plan Note (Signed)
He tried melatonin at night but hasn't tried it in the middle of the night.   That may be a good option.  He will try that and then update me as needed.

## 2020-02-16 NOTE — Assessment & Plan Note (Signed)
Flu shot 2021 Shingles 2008 PNA prev done. Tetanus 2013 Colonoscopy 2018 Prostate cancer per uro. He has f/u pending.  Living will d/w pt. Would have [name] designated if patient were incapacitated.  He uses amoxil prior to dental visits.

## 2020-02-16 NOTE — Assessment & Plan Note (Signed)
Labs discussed with patient.Still on losartan.  Cr stable.  D/w pt.  Avoids oral nsaids, routine cautions discussed with patient.

## 2020-02-16 NOTE — Assessment & Plan Note (Signed)
Living will d/w pt. Would have [name] designated if patient were incapacitated.

## 2020-02-18 DIAGNOSIS — N1832 Chronic kidney disease, stage 3b: Secondary | ICD-10-CM | POA: Diagnosis not present

## 2020-02-18 DIAGNOSIS — E1122 Type 2 diabetes mellitus with diabetic chronic kidney disease: Secondary | ICD-10-CM | POA: Diagnosis not present

## 2020-02-18 DIAGNOSIS — I10 Essential (primary) hypertension: Secondary | ICD-10-CM | POA: Diagnosis not present

## 2020-02-18 DIAGNOSIS — R81 Glycosuria: Secondary | ICD-10-CM | POA: Insufficient documentation

## 2020-03-10 ENCOUNTER — Telehealth: Payer: Self-pay

## 2020-03-10 DIAGNOSIS — R197 Diarrhea, unspecified: Secondary | ICD-10-CM

## 2020-03-10 NOTE — Telephone Encounter (Signed)
Pt left v/m that for 1 wk has had watery diarrhea once a day; OTC med (generic immodium)not helping; but pt is only taking 1/2 tab daily because he does not want to get constipated; BS is stable and pt seen for annual on 02/13/20. I spoke with pt; last diarrhea was 03/09/20 in afternoon; no BM since yesterday. Pt had dental appt last wk and pt premedicated with Amoxicillin prior to dental appt and has not bothered pt before. No fever,no chills, no body aches, no cough or SOB; no rash, no abd pain, no loss of taste or smell, no other covid symptoms, no travel, no known exposure to + covid; pt has had covid vaccines and flu shot. For 2 days pt has had a dull H/A pain level now is 1-2; when sense of needing to have BM pt does not have wait time but almost immediately has diarrhea.[facility]. Pt does not want to schedule virtual appt with anyone else and request since recently had annual exam to send phone note to DR Damita Dunnings. Pt request cb after Dr Damita Dunnings reviews the note.

## 2020-03-10 NOTE — Telephone Encounter (Signed)
Recent GI illness has been reported in the community with similar sx- this is unpleasant but usually self resolving.   If no recent BM, then this may be self resolving.  I would only use imodium prn but it would be okay to use up to 3 times daily if needed.  That should take care of it.  I wouldn't change his baseline meds o/w unless he is feeling lightheaded.  If lightheaded, then skip losartan until he is feeling better.  Please update me as needed.  Thanks.

## 2020-03-10 NOTE — Telephone Encounter (Signed)
Patient advised.

## 2020-03-31 DIAGNOSIS — C61 Malignant neoplasm of prostate: Secondary | ICD-10-CM | POA: Diagnosis not present

## 2020-04-03 NOTE — Telephone Encounter (Signed)
Patient advised and can come this afternoon to pick up the kit.  Please put in the orders.

## 2020-04-03 NOTE — Addendum Note (Signed)
Addended by: Tonia Ghent on: 04/03/2020 12:17 PM   Modules accepted: Orders
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
## 2020-04-03 NOTE — Telephone Encounter (Addendum)
If this isn't getting better, then I think it makes sense to check a GI path panel.  We need to exclude infectious cause.  Would check fecal elastase given h/o DM2.  I get the point about stress being a trigger but I would check the labs as soon as possible.  See when he can pick up a kit.  I would hold off the covid booster until this is resolved.  Still okay to use imodium prn.  Thanks.

## 2020-04-03 NOTE — Telephone Encounter (Signed)
Orders are in   Thanks

## 2020-04-04 ENCOUNTER — Ambulatory Visit: Payer: Medicare Other

## 2020-04-06 ENCOUNTER — Other Ambulatory Visit: Payer: Medicare Other

## 2020-04-06 DIAGNOSIS — R197 Diarrhea, unspecified: Secondary | ICD-10-CM

## 2020-04-07 DIAGNOSIS — N393 Stress incontinence (female) (male): Secondary | ICD-10-CM | POA: Diagnosis not present

## 2020-04-07 DIAGNOSIS — N183 Chronic kidney disease, stage 3 unspecified: Secondary | ICD-10-CM | POA: Diagnosis not present

## 2020-04-07 DIAGNOSIS — N5201 Erectile dysfunction due to arterial insufficiency: Secondary | ICD-10-CM | POA: Diagnosis not present

## 2020-04-07 DIAGNOSIS — C61 Malignant neoplasm of prostate: Secondary | ICD-10-CM | POA: Diagnosis not present

## 2020-04-08 ENCOUNTER — Other Ambulatory Visit: Payer: Self-pay | Admitting: Family Medicine

## 2020-04-13 LAB — PANCREATIC ELASTASE, FECAL: Pancreatic Elastase-1, Stool: >500 mcg/g

## 2020-04-13 LAB — GASTROINTESTINAL PATHOGEN PANEL PCR

## 2020-04-14 ENCOUNTER — Telehealth: Payer: Self-pay | Admitting: Radiology

## 2020-04-14 NOTE — Progress Notes (Signed)
Patient declined to have a repeat stool test at this time. He is doing better

## 2020-04-14 NOTE — Telephone Encounter (Signed)
Patient returned my call. He has declined doing a repeat stool test at this time, he is doing betted.

## 2020-04-14 NOTE — Telephone Encounter (Signed)
Noted. Thanks.

## 2020-04-18 ENCOUNTER — Ambulatory Visit: Payer: Medicare Other | Attending: Internal Medicine

## 2020-04-18 ENCOUNTER — Other Ambulatory Visit: Payer: Self-pay

## 2020-04-18 DIAGNOSIS — Z23 Encounter for immunization: Secondary | ICD-10-CM

## 2020-05-07 ENCOUNTER — Ambulatory Visit (INDEPENDENT_AMBULATORY_CARE_PROVIDER_SITE_OTHER): Payer: Medicare Other | Admitting: Dermatology

## 2020-05-07 ENCOUNTER — Other Ambulatory Visit: Payer: Self-pay

## 2020-05-07 DIAGNOSIS — L578 Other skin changes due to chronic exposure to nonionizing radiation: Secondary | ICD-10-CM

## 2020-05-07 DIAGNOSIS — Z872 Personal history of diseases of the skin and subcutaneous tissue: Secondary | ICD-10-CM | POA: Diagnosis not present

## 2020-05-07 DIAGNOSIS — L570 Actinic keratosis: Secondary | ICD-10-CM

## 2020-05-07 DIAGNOSIS — Z85828 Personal history of other malignant neoplasm of skin: Secondary | ICD-10-CM

## 2020-05-07 NOTE — Progress Notes (Signed)
Follow-Up Visit   Subjective  Guy [name] is a 74 y.o. male who presents for the following: Follow-up.  Patient here today for 3 month AK follow up at right cheek and right temple that were treated with LN2. Patient did well after treatment but still has scaly areas at cheek. No other new spots today. Patient had full skin exam 3 months ago at last appointment and is scheduled in August 2022 for FBSE. Has a history of BCC.  The following portions of the chart were reviewed this encounter and updated as appropriate:  Tobacco  Allergies  Meds  Problems  Med Hx  Surg Hx  Fam Hx      Review of Systems:  No other skin or systemic complaints except as noted in HPI or Assessment and Plan.  Objective  Well appearing patient in no apparent distress; mood and affect are within normal limits.  A focused examination was performed including face, ears, neck, chest. Relevant physical exam findings are noted in the Assessment and Plan.     Assessment & Plan    Actinic Damage - chronic, secondary to cumulative UV radiation exposure/sun exposure over time - diffuse scaly erythematous macules with underlying dyspigmentation - Recommend daily broad spectrum sunscreen SPF 30+ to sun-exposed areas, reapply every 2 hours as needed.  - Call for new or changing lesions. - Discussed "Field Treatment" for Severe, Confluent Actinic Changes with Pre-Cancerous Actinic Keratoses due to cumulative sun exposure/UV radiation exposure over time Field treatment involves treatment of an entire area of skin that has confluent Actinic Changes (Sun/ Ultraviolet light damage) and PreCancerous Actinic Keratoses by method of PhotoDynamic Therapy (PDT) and/or prescription Topical Chemotherapy agents such as 5-fluorouracil, 5-fluorouracil/calcipotriene, and/or imiquimod.  The purpose is to decrease the number of clinically evident and subclinical PreCancerous lesions to prevent progression to development of skin  cancer by chemically destroying early precancer changes that may or may not be visible.  It has been shown to reduce the risk of developing skin cancer in the treated area. As a result of treatment, redness, scaling, crusting, and open sores may occur during treatment course. One or more than one of these methods may be used and may have to be used several times to control, suppress and eliminate the PreCancerous changes. Discussed treatment course, expected reaction, and possible side effects. - Start 5-fluorouracil/calcipotriene cream to face twice daily for 4 days.  [facility]ication sent to [facility] and patient advised how to check out online through the link in his email.  Patient will order around Christmas and may treat twice prior to follow up.  - Reviewed course of treatment and expected reaction.  Patient advised to expect inflammation and crusting and advised that erosions are possible.  Patient advised to be diligent with sun protection during and after treatment. Handout with details of how to apply medication and what to expect provided.  History of PreCancerous Actinic Keratosis  - site of PreCancerous Actinic Keratosis at right temple clear today - these may recur and new lesions may form requiring treatment to prevent transformation into skin cancer - observe for new or changing spots and contact [facility] for appointment if occur - photoprotection with sun protective clothing; sunglasses and broad spectrum sunscreen with SPF of at least 30 + and frequent self skin exams recommended - yearly exams by a dermatologist recommended for persons with history of PreCancerous Actinic Keratoses  Return in about 4 months (around 09/04/2020) for AK follow up.  Graciella Belton, RMA, am acting as scribe for Forest Gleason, MD .  Documentation: I have reviewed the above documentation for accuracy and completeness, and I agree with the above.  Forest Gleason, MD

## 2020-05-07 NOTE — Patient Instructions (Addendum)
5-Fluorouracil/Calcipotriene Patient Education   Actinic keratoses are the dry, red scaly spots on the skin caused by sun damage. A portion of these spots can turn into skin cancer with time, and treating them can help prevent development of skin cancer.   Treatment of these spots requires removal of the defective skin cells. There are various ways to remove actinic keratoses, including freezing with liquid nitrogen, treatment with creams, or treatment with a blue light procedure in the office.   5-fluorouracil cream is a topical cream used to treat actinic keratoses. It works by interfering with the growth of abnormal fast-growing skin cells, such as actinic keratoses. These cells peel off and are replaced by healthy ones.   5-fluorouracil/calcipotriene is a combination of the 5-fluorouracil cream with a vitamin D analog cream called calcipotriene. The calcipotriene alone does not treat actinic keratoses. However, when it is combined with 5-fluorouracil, it helps the 5-fluorouracil treat the actinic keratoses much faster so that the same results can be achieved with a much shorter treatment time.  INSTRUCTIONS FOR 5-FLUOROURACIL/CALCIPOTRIENE CREAM:   5-fluorouracil/calcipotriene cream typically only needs to be used for 4-7 days. A thin layer should be applied twice a day to the treatment areas recommended by your physician.   If your physician prescribed you separate tubes of 5-fluourouracil and calcipotriene, apply a thin layer of 5-fluorouracil followed by a thin layer of calcipotriene.   Avoid contact with your eyes, nostrils, and mouth. Do not use 5-fluorouracil/calcipotriene cream on infected or open wounds.   You will develop redness, irritation and some crusting at areas where you have pre-cancer damage/actinic keratoses. IF YOU DEVELOP PAIN, BLEEDING, OR SIGNIFICANT CRUSTING, STOP THE TREATMENT EARLY - you have already gotten a good response and the actinic keratoses should clear up  well.  Wash your hands after applying 5-fluorouracil 5% cream on your skin.   A moisturizer or sunscreen with a minimum SPF 30 should be applied each morning.   Once you have finished the treatment, you can apply a thin layer of Vaseline twice a day to irritated areas to soothe and calm the areas more quickly. If you experience significant discomfort, contact your physician.  For some patients it is necessary to repeat the treatment for best results.  SIDE EFFECTS: When using 5-fluorouracil/calcipotriene cream, you may have mild irritation, such as redness, dryness, swelling, or a mild burning sensation. This usually resolves within 2 weeks. The more actinic keratoses you have, the more redness and inflammation you can expect during treatment. Eye irritation has been reported rarely. If this occurs, please let us know.  If you have any trouble using this cream, please call the office. If you have any other questions about this information, please do not hesitate to ask me before you leave the office.  Instructions for [facility]  One or more of your medications was sent to the [facility] mail order compounding pharmacy. You will receive an email from them and can purchase the medicine through that link. It will then be mailed to your home at the address you confirmed. If for any reason you do not receive an email from them, please check your spam folder. If you still do not find the email, please let us know. [facility] phone number is [phone].

## 2020-05-09 ENCOUNTER — Encounter: Payer: Self-pay | Admitting: Dermatology

## 2020-05-26 ENCOUNTER — Other Ambulatory Visit: Payer: Self-pay | Admitting: Family Medicine

## 2020-06-15 ENCOUNTER — Ambulatory Visit: Payer: Medicare Other | Admitting: Family Medicine

## 2020-06-18 ENCOUNTER — Ambulatory Visit: Payer: Medicare Other | Admitting: Family Medicine

## 2020-06-22 ENCOUNTER — Telehealth: Payer: Self-pay

## 2020-06-22 NOTE — Telephone Encounter (Signed)
Patient called stating that he had not received an e-mail from [facility] in regards to the 5FU/Calcipotriene cream. I logged into [facility] and re-sent the activation code. I also gave the patient their contact information and advised him to return my call if he has any further problems.

## 2020-06-26 ENCOUNTER — Ambulatory Visit: Payer: Medicare Other | Admitting: Family Medicine

## 2020-07-03 ENCOUNTER — Other Ambulatory Visit: Payer: Self-pay

## 2020-07-03 ENCOUNTER — Ambulatory Visit (INDEPENDENT_AMBULATORY_CARE_PROVIDER_SITE_OTHER): Payer: Medicare Other | Admitting: Family Medicine

## 2020-07-03 ENCOUNTER — Encounter: Payer: Self-pay | Admitting: Family Medicine

## 2020-07-03 VITALS — BP 118/80 | HR 75 | Temp 97.8°F | Ht 65.0 in | Wt 153.0 lb

## 2020-07-03 DIAGNOSIS — E119 Type 2 diabetes mellitus without complications: Secondary | ICD-10-CM

## 2020-07-03 LAB — POCT GLYCOSYLATED HEMOGLOBIN (HGB A1C): Hemoglobin A1C: 6.8 % — AB (ref 4.0–5.6)

## 2020-07-03 NOTE — Patient Instructions (Addendum)
Plan on recheck in about 4 months.  A1c at the visit.  Take care.  Glad to see you. Don't change your meds for now.

## 2020-07-03 NOTE — Progress Notes (Signed)
This visit occurred during the SARS-CoV-2 public health emergency.  Safety protocols were in place, including screening questions prior to the visit, additional usage of staff PPE, and extensive cleaning of exam room while observing appropriate contact time as indicated for disinfecting solutions.  Diabetes:  No meds.  A1c 6.8, done at OV and d/w pt.   Hypoglycemic episodes: no Hyperglycemic episodes: no Feet problems: no Blood Sugars averaging: usually ~100-120s eye exam within last year: yes Statin intolerant.    His GI sx got better.  He had normal elastase test but other sample couldn't be processed.  "It's more normal than it was."  Sx started after eating at a BBQ joint.  Sleeping better at night, no longer have nocturnal discomfort, audible bowel sounds.  He has taken fiber supplement.  That seemed to help.    No syncope but we talked about vagal episode.  He had episode of lightheadedness with BM/nausea/hot feeling.  This is rare, a few times lifetime.  Cautions d/w pt.    Meds, vitals, and allergies reviewed.  ROS: Per HPI unless specifically indicated in ROS section   GEN: nad, alert and oriented HEENT: ncat NECK: supple w/o LA CV: rrr. PULM: ctab, no inc wob ABD: soft, +bs EXT: no edema SKIN: no acute rash

## 2020-07-06 NOTE — Assessment & Plan Note (Signed)
Statin intolerant. A1c controlled and improved to 6.8. No change in meds at this point. Continue work on diet and exercise and recheck periodically. He agrees.  Fortunately his diarrhea resolved in the meantime and he had normal elastase stool test. If his symptoms get worse in the meantime he will let me know.

## 2020-08-11 DIAGNOSIS — N1832 Chronic kidney disease, stage 3b: Secondary | ICD-10-CM | POA: Diagnosis not present

## 2020-08-18 DIAGNOSIS — N1832 Chronic kidney disease, stage 3b: Secondary | ICD-10-CM | POA: Diagnosis not present

## 2020-08-18 DIAGNOSIS — E1122 Type 2 diabetes mellitus with diabetic chronic kidney disease: Secondary | ICD-10-CM | POA: Diagnosis not present

## 2020-08-18 DIAGNOSIS — I10 Essential (primary) hypertension: Secondary | ICD-10-CM | POA: Diagnosis not present

## 2020-09-03 ENCOUNTER — Ambulatory Visit (INDEPENDENT_AMBULATORY_CARE_PROVIDER_SITE_OTHER): Payer: Medicare Other | Admitting: Dermatology

## 2020-09-03 ENCOUNTER — Encounter: Payer: Self-pay | Admitting: Dermatology

## 2020-09-03 ENCOUNTER — Other Ambulatory Visit: Payer: Self-pay

## 2020-09-03 DIAGNOSIS — Z872 Personal history of diseases of the skin and subcutaneous tissue: Secondary | ICD-10-CM

## 2020-09-03 DIAGNOSIS — L578 Other skin changes due to chronic exposure to nonionizing radiation: Secondary | ICD-10-CM

## 2020-09-03 DIAGNOSIS — L820 Inflamed seborrheic keratosis: Secondary | ICD-10-CM

## 2020-09-03 NOTE — Progress Notes (Signed)
   Follow-Up Visit   Subjective  Guy [name] is a 75 y.o. male who presents for the following: Actinic Keratosis (Face, 32m f/u, used 5FU/Calcipotriene mix bid x 6d to R cheek Jan 22, pt got mild reaction).   The following portions of the chart were reviewed this encounter and updated as appropriate:   Tobacco  Allergies  Meds  Problems  Med Hx  Surg Hx  Fam Hx      Review of Systems:  No other skin or systemic complaints except as noted in HPI or Assessment and Plan.  Objective  Well appearing patient in no apparent distress; mood and affect are within normal limits.  A focused examination was performed including face. Relevant physical exam findings are noted in the Assessment and Plan.  Objective  L jaw: Erythematous keratotic or waxy stuck-on papule   Objective  R cheek: Face clear today   Assessment & Plan  Inflamed seborrheic keratosis L jaw  Plan LN2 at f/u  Actinic skin damage R cheek  - chronic, secondary to cumulative UV radiation exposure/sun exposure over time - well-controlled s/p 5FU/calcipotriene treatment 06/2020 - diffuse scaly erythematous macules with underlying dyspigmentation - Recommend daily broad spectrum sunscreen SPF 30+ to sun-exposed areas, reapply every 2 hours as needed.  - Recommend staying in the shade or wearing long sleeves, sun glasses (UVA+UVB protection) and wide brim hats (4-inch brim around the entire circumference of the hat). - Call for new or changing lesions.  Return for as scheduled for TBSE, hx of AKs, BCC, txte ISK L jaw.   I, Othelia Pulling, RMA, am acting as scribe for Forest Gleason, MD .  Documentation: I have reviewed the above documentation for accuracy and completeness, and I agree with the above.  Forest Gleason, MD

## 2020-09-03 NOTE — Patient Instructions (Signed)

## 2020-09-07 ENCOUNTER — Other Ambulatory Visit: Payer: Self-pay

## 2020-09-07 ENCOUNTER — Encounter: Payer: Self-pay | Admitting: Family Medicine

## 2020-09-07 ENCOUNTER — Ambulatory Visit (INDEPENDENT_AMBULATORY_CARE_PROVIDER_SITE_OTHER): Payer: Medicare Other | Admitting: Family Medicine

## 2020-09-07 DIAGNOSIS — E119 Type 2 diabetes mellitus without complications: Secondary | ICD-10-CM | POA: Diagnosis not present

## 2020-09-07 NOTE — Progress Notes (Signed)
This visit occurred during the SARS-CoV-2 public health emergency.  Safety protocols were in place, including screening questions prior to the visit, additional usage of staff PPE, and extensive cleaning of exam room while observing appropriate contact time as indicated for disinfecting solutions.  Diabetes:  No meds. Hypoglycemic episodes: No Hyperglycemic episodes: No  Feet problems: No Blood Sugars averaging: ~100 in the AM, up to ~200 around 2 hours after eating He was asking about getting a continuous glucose meter.  I told him I will check on options.    We also talked about potentially starting Farxiga but I need input from renal.  BP has been controlled on home checks and here today.  He isn't lightheaded.    He had bloating and cramping and loose stools on higher dose of melatonin (>10mg ).  He stopped med and sx resolved.    Meds, vitals, and allergies reviewed.  ROS: Per HPI unless specifically indicated in ROS section   GEN: nad, alert and oriented HEENT: ncat NECK: supple w/o LA CV: rrr. PULM: ctab, no inc wob ABD: soft, +bs EXT: no edema SKIN: no acute rash

## 2020-09-07 NOTE — Patient Instructions (Signed)
Let me check on options about the meter and med options.  Take care.  Glad to see you.

## 2020-09-09 ENCOUNTER — Telehealth: Payer: Self-pay | Admitting: Family Medicine

## 2020-09-09 NOTE — Assessment & Plan Note (Signed)
He was asking about getting a continuous glucose meter.  I told him I will check on options.    We also talked about potentially starting [place] but I need input from renal.  I will get extra information and we will update the patient.  He agrees with plan.

## 2020-09-09 NOTE — Telephone Encounter (Signed)
Please check with Dr. Candiss Norse with renal and see if he is okay with this patient taking Farxiga 5 mg a day.  Please let me know.  Thanks.

## 2020-09-10 ENCOUNTER — Telehealth: Payer: Self-pay

## 2020-09-10 NOTE — Telephone Encounter (Signed)
Received request from Dr. Damita Dunnings to look into CGM for this patient.   Ria Comment, can you review cost with the DME company for [employer]? He is not on any diabetes medications.   Thanks,  Debbora Dus, PharmD Clinical Pharmacist [facility] at [facility] [phone]

## 2020-09-10 NOTE — Chronic Care Management (AMB) (Signed)
Message sent to Thurmond Butts at [facility] to review patient insurance to see if he would qualify for CGM. Will update once advised.    Follow-Up:  Pharmacist Review  Debbora Dus, CPP notified  Margaretmary Dys, [place] [facility] Assistant [phone]

## 2020-09-10 NOTE — Chronic Care Management (AMB) (Signed)
Per Thurmond Butts, if primary plan is a Astron[employer] they may qualify with just a diabetes diagnosis but some commercial plans require at least 1 injection a day. She advised to send order and they will do the benefits investigation.   Follow-Up:  Pharmacist Review  Debbora Dus, CPP notified  Margaretmary Dys, [place] [facility] Assistant [phone]

## 2020-09-10 NOTE — Telephone Encounter (Addendum)
Dr. Damita Dunnings,   Apparently the only way to know CGM coverage for commercial plans is to send in the DME order form (along with chart notes, etc). Let me know if you need the DME order form. Don't forget we have Dexcom samples (and soon to be [employer]) up front if a patient is interested in a trial.  Debbora Dus, PharmD Clinical Pharmacist [facility] at [facility] [phone]

## 2020-09-11 NOTE — Telephone Encounter (Signed)
Left message for Dr. Candiss Norse office to see if okay for patient to start [place]

## 2020-09-11 NOTE — Telephone Encounter (Signed)
Please send me the DME order form and I will work on it.  Thanks.

## 2020-09-11 NOTE — Telephone Encounter (Signed)
Sent to you in [employer].

## 2020-09-13 NOTE — Telephone Encounter (Signed)
Form done, please scan and send.  Thanks.  

## 2020-09-14 ENCOUNTER — Encounter: Payer: Self-pay | Admitting: Dermatology

## 2020-09-14 NOTE — Telephone Encounter (Signed)
Form has been faxed.

## 2020-09-16 NOTE — Telephone Encounter (Signed)
Left another message with Dr. Candiss Norse office

## 2020-09-17 NOTE — Telephone Encounter (Signed)
Mad[place] from Dr. Candiss Norse office called back and stated Dr. Candiss Norse is okay with patient starting farxiga 5 mg a day.

## 2020-09-18 ENCOUNTER — Telehealth: Payer: Self-pay

## 2020-09-18 MED ORDER — DAPAGLIFLOZIN PROPANEDIOL 5 MG PO TABS: 5.0000 mg | ORAL_TABLET | Freq: Every day | ORAL | 3 refills | Status: DC

## 2020-09-18 NOTE — Telephone Encounter (Signed)
PA was needed for Farixga; PA was done and approved. Notified patient rx was sent and can pick up today to start. Advised patient if he develops any urinary sx to stop med and let us know. Patient verbalizes understanding.

## 2020-09-18 NOTE — Addendum Note (Signed)
Addended by: Tonia Ghent on: 09/18/2020 07:59 AM   Modules accepted: Orders

## 2020-09-18 NOTE — Telephone Encounter (Signed)
Sahid with [facility] called to state that they received form for DME order but still need OV notes from visit within the past 6 months and a copy of pt's most recent insurance  Fax number is [phone]

## 2020-09-18 NOTE — Telephone Encounter (Signed)
OV notes have been faxed to [facility]

## 2020-09-18 NOTE — Telephone Encounter (Addendum)
rx sent.  Please update patient re: plan  Make sure he stays well hydrated.  If urinary sx on the med, then stop and let me know.  Would keep may appointment as scheduled.  Thanks.

## 2020-09-29 ENCOUNTER — Encounter: Payer: Self-pay | Admitting: Family Medicine

## 2020-09-30 DIAGNOSIS — Z20822 Contact with and (suspected) exposure to covid-19: Secondary | ICD-10-CM | POA: Diagnosis not present

## 2020-09-30 NOTE — Telephone Encounter (Signed)
Please see about sending the order for the supplies.  I sent a note back to the patient in the meantime.  Thanks.

## 2020-10-01 MED ORDER — FREESTYLE LIBRE 2 SENSOR MISC: 3 refills | Status: DC

## 2020-10-01 MED ORDER — FREESTYLE LIBRE 2 READER DEVI: 0 refills | Status: DC

## 2020-11-03 ENCOUNTER — Ambulatory Visit: Payer: Medicare Other | Admitting: Family Medicine

## 2020-11-03 ENCOUNTER — Other Ambulatory Visit: Payer: Self-pay

## 2020-11-03 ENCOUNTER — Encounter: Payer: Self-pay | Admitting: Family Medicine

## 2020-11-03 ENCOUNTER — Ambulatory Visit (INDEPENDENT_AMBULATORY_CARE_PROVIDER_SITE_OTHER): Payer: Medicare Other | Admitting: Family Medicine

## 2020-11-03 VITALS — BP 116/80 | HR 89 | Temp 97.9°F | Ht 65.0 in | Wt 145.0 lb

## 2020-11-03 DIAGNOSIS — E119 Type 2 diabetes mellitus without complications: Secondary | ICD-10-CM

## 2020-11-03 LAB — POCT GLYCOSYLATED HEMOGLOBIN (HGB A1C): Hemoglobin A1C: 6.1 % — AB (ref 4.0–5.6)

## 2020-11-03 NOTE — Patient Instructions (Addendum)
Stop farxiga for now.  Let me see about getting nutrition set up.   Take care.  Glad to see you. Let me know about your sugars in about 2 weeks, sooner if needed.  Plan on recheck in about 3 months at a yearly visit, labs ahead is possible.

## 2020-11-03 NOTE — Progress Notes (Signed)
This visit occurred during the SARS-CoV-2 public health emergency.  Safety protocols were in place, including screening questions prior to the visit, additional usage of staff PPE, and extensive cleaning of exam room while observing appropriate contact time as indicated for disinfecting solutions.  Diabetes:  Using medications without difficulties: see below, on farxiga.   Hypoglycemic episodes: see below.   Hyperglycemic episodes: see below.   Feet problems:no Blood Sugars averaging: see below.   eye exam within last year: yes A1c improved to 6.1.   Weight loss noted- he wants to improve meal planning.  He didn't have awareness of low sugars- it was noted on the meter.    He had strawberries recently and sugar was elevated 241.  He had lower sugar soon thereafter, down to 92 (that was 3 hours later).  He has lows down to 66 in the last 2 nights. Low sugars corrected with snack, up to ~150.    Meds, vitals, and allergies reviewed.  ROS: Per HPI unless specifically indicated in ROS section   GEN: nad, alert and oriented HEENT: ncat NECK: supple w/o LA CV: rrr. PULM: ctab, no inc wob ABD: soft, +bs EXT: no edema SKIN: no acute rash

## 2020-11-04 NOTE — Assessment & Plan Note (Signed)
Refer to nutrition to help with meal planning.  I appreciate input from nutrition. Stop Guy [name] in the meantime, given low sugars. Continue sugar monitoring.  We will update the renal clinic.  He will follow-up in 3 months, but I asked him to update me in about 2 weeks regarding his sugars.  Hypoglycemia cautions discussed with patient.

## 2020-11-17 ENCOUNTER — Telehealth: Payer: Self-pay

## 2020-11-17 NOTE — Progress Notes (Signed)
Application for CGM has been completed and is awaiting physician signiture. Application to be faxed to [facility] when completed.

## 2020-11-18 DIAGNOSIS — H353131 Nonexudative age-related macular degeneration, bilateral, early dry stage: Secondary | ICD-10-CM | POA: Diagnosis not present

## 2020-11-18 DIAGNOSIS — H353211 Exudative age-related macular degeneration, right eye, with active choroidal neovascularization: Secondary | ICD-10-CM | POA: Diagnosis not present

## 2020-11-18 LAB — HM DIABETES EYE EXAM

## 2020-11-20 ENCOUNTER — Other Ambulatory Visit: Payer: Self-pay

## 2020-11-20 ENCOUNTER — Encounter: Payer: Self-pay | Admitting: *Deleted

## 2020-11-20 ENCOUNTER — Encounter: Payer: Medicare Other | Attending: Family Medicine | Admitting: *Deleted

## 2020-11-20 VITALS — BP 120/70 | Ht 65.0 in | Wt 148.5 lb

## 2020-11-20 DIAGNOSIS — E119 Type 2 diabetes mellitus without complications: Secondary | ICD-10-CM | POA: Diagnosis not present

## 2020-11-20 DIAGNOSIS — N1832 Chronic kidney disease, stage 3b: Secondary | ICD-10-CM

## 2020-11-20 NOTE — Progress Notes (Signed)
Diabetes Self-Management Education  Visit Type: First/Initial  Appt. Start Time: 1045 Appt. End Time: 1210  11/20/2020  [name], identified by name and date of birth, is a 75 y.o. male with a diagnosis of Diabetes: Type 2.   ASSESSMENT  Blood pressure 120/70, height 5\' 5"  (1.651 m), weight 148 lb 8 oz (67.4 kg). Body mass index is 24.71 kg/m.   Diabetes Self-Management Education - 11/20/20 1457      Visit Information   Visit Type First/Initial      Initial Visit   Diabetes Type Type 2    Are you currently following a meal plan? No    Are you taking your medications as prescribed? Yes    Date Diagnosed 15 years ago      Health Coping   How would you rate your overall health? Good      Psychosocial Assessment   Patient Belief/Attitude about Diabetes Other (comment)   "a little stressed"   Self-care barriers None    Self-management support Doctor's office;Family    Patient Concerns Nutrition/Meal planning;Glycemic Control;Monitoring    Special Needs None    Preferred Learning Style Visual;Hands on    Learning Readiness Change in progress    How often do you need to have someone help you when you read instructions, pamphlets, or other written materials from your doctor or pharmacy? 1 - Never    What is the last grade level you completed in school? 14      Pre-Education Assessment   Patient understands the diabetes disease and treatment process. Needs Review    Patient understands incorporating nutritional management into lifestyle. Needs Instruction    Patient undertands incorporating physical activity into lifestyle. Needs Review    Patient understands using medications safely. Needs Review    Patient understands monitoring blood glucose, interpreting and using results Needs Review    Patient understands prevention, detection, and treatment of acute complications. Needs Instruction    Patient understands prevention, detection, and treatment of chronic complications.  Needs Review    Patient understands how to develop strategies to address psychosocial issues. Needs Review    Patient understands how to develop strategies to promote health/change behavior. Needs Review      Complications   Last HgB A1C per patient/outside source 6.1 %   11/03/2020 - improved from 6.8% on 07/03/2020   How often do you check your blood sugar? > 4 times/day   Pt has FreeStyle Libre CGM. Time in Range 97% for 14 days. He recently changed his sensor and data captured for 22 days. Average glucose 123 mg/dL for 14 days.    Postprandial Blood glucose range (mg/dL) --   Readings 181-240 mg/dL averaged 3% on CGM for 14 days   Number of hypoglycemic episodes per month 1    Can you tell when your blood sugar is low? No   His GCM read 69 but he denied any symptoms. He ate some chocolate candy since he "panicked" and rechecked with reading of 95 mg/dL.   Have you had a dilated eye exam in the past 12 months? 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   Have you had a dental exam in the past 12 months? Yes    Are you checking your feet? Yes    How many days per week are you checking your feet? 7      Dietary Intake   Breakfast eggs, pancake, bacon; egg and bacon sandwich on whole wheat  bread; cereal and milk    Snack (morning) protein bar or granola bar    Lunch beef stew with turnip greens and green beans; chicken salad or pimento cheese sandwich    Snack (afternoon) same as morning    Dinner lean cuisine meals; meat sandwich; beef, chicken, broiled fish and occasional pork; potatoes, peas, green beans, carrots; salads with lettuce, tomatoes and cuccumbers    Snack (evening) same as morning    Beverage(s) water, coffee, unsweetened tea, diet soda      Exercise   Exercise Type Light (walking / raking leaves)    How many days per week to you exercise? 3    How many minutes per day do you exercise? 45    Total minutes per week of exercise 135       Patient Education   Previous Diabetes Education Yes (please comment)   He went to classes years ago   Disease state  Explored patient's options for treatment of their diabetes    Nutrition management  Role of diet in the treatment of diabetes and the relationship between the three main macronutrients and blood glucose level;Food label reading, portion sizes and measuring food.;Carbohydrate counting;Reviewed blood glucose goals for pre and post meals and how to evaluate the patients' food intake on their blood glucose level.    Physical activity and exercise  Role of exercise on diabetes management, blood pressure control and cardiac health.    Medications --   He recently stopped [place] - thought this was lowering his blood sugars too low.   Monitoring Purpose and frequency of SMBG.;Taught/discussed recording of test results and interpretation of SMBG.;Interpreting lab values - A1C, lipid, urine microalbumina.;Identified appropriate SMBG and/or A1C goals.    Acute complications Taught treatment of hypoglycemia - the 15 rule.    Chronic complications Relationship between chronic complications and blood glucose control;Nephropathy, what it is, prevention of, the use of ACE, ARB's and early detection of through urine microalbumia.    Psychosocial adjustment Identified and addressed patients feelings and concerns about diabetes      Individualized Goals (developed by patient)   Reducing Risk Other (comment)   Improve blood sugars, prevent diabetes complications     Outcomes   Expected Outcomes Demonstrated interest in learning. Expect positive outcomes    Future DMSE --   3 weeks          Individualized Plan for Diabetes Self-Management Training:   Learning Objective:  Patient will have a greater understanding of diabetes self-management. Patient education plan is to attend individual and/or group sessions per assessed needs and concerns.   Plan:   Patient Instructions  Check blood  sugars before breakfast and 2 hours after meals or as needed with FreeStyle Libre CGM Bring blood sugar records to the next appointment  Exercise: Continue walking for 30-60 minutes  3 days a week (exercise 150 minutes/week)  Eat 3 meals day, 2 snacks a day Space meals 4-6 hours apart Allow 2-3 hours between meals and snacks  Complete 3 Day Food Record and bring to next appt  Carry fast acting glucose and a snack at all times  Return for appointment on:  Tuesday December 08, 2020 at 10:30 am with Freda Munro (nurse)  Expected Outcomes:  Demonstrated interest in learning. Expect positive outcomes  Education material provided:  General Meal Planning Guidelines Simple Meal Plan 3 Day Food Record Glucose tablets Symptoms, causes and treatments of Hypoglycemia Smart Snacking Quick and Balanced Meals  If problems or questions,  patient to contact team via:  Johny Drilling, RN, [place], [place] [phone]  Future DSME appointment:  (3 weeks)  December 08, 2020 with the dietitian

## 2020-11-20 NOTE — Patient Instructions (Addendum)
Check blood sugars before breakfast and 2 hours after meals or as needed with FreeStyle Libre CGM Bring blood sugar records to the next appointment  Exercise: Continue walking for 30-60 minutes  3 days a week (exercise 150 minutes/week)  Eat 3 meals day, 2 snacks a day Space meals 4-6 hours apart Allow 2-3 hours between meals and snacks  Complete 3 Day Food Record and bring to next appt  Carry fast acting glucose and a snack at all times  Return for appointment on:  Tuesday December 08, 2020 at 10:30 am with Freda Munro (nurse)

## 2020-11-25 ENCOUNTER — Telehealth: Payer: Self-pay

## 2020-11-25 NOTE — Chronic Care Management (AMB) (Signed)
Contacted patient and he informed me that he has the CGM now. He bought this from his pharmacy. He states the meter was $72 and 2 sensors were $74 which will only last him 1 month. He is ok with this right now but would like to see if Medicare would cover this. BCBS was the only payor for the CGM.   Margaretmary Dys, [place] [facility] Assistant [phone]

## 2020-11-25 NOTE — Chronic Care Management (AMB) (Signed)
    Chronic Care Management Pharmacy Assistant   Name: Guy [name]  MRN: [id number] DOB: Sep 15, 1945   Reason for Encounter: CGM Coverage     Medications: Outpatient Encounter Medications as of 11/25/2020  Medication Sig  . acetaminophen (TYLENOL) 500 MG tablet Take 1,000 mg by mouth 2 (two) times daily.   . Blood Glucose Monitoring Suppl (ONETOUCH VERIO) w/Device KIT 1 kit by Other route daily. Use as instructed to check blood sugar once daily and as needed.  Diagnosis:  E11.9  Non insulin dependent.  . Continuous Blood Gluc Receiver (FREESTYLE LIBRE 2 READER) DEVI Use as directed to check blood sugars daily. Dx E11.9  . Continuous Blood Gluc Sensor (FREESTYLE LIBRE 2 SENSOR) MISC Use as directed to check blood sugars daily. Dx: E11.9  . diclofenac sodium (VOLTAREN) 1 % GEL Apply topically 4 (four) times daily as needed.  . famotidine (PEPCID) 10 MG tablet Take 10 mg by mouth daily.  . Lancets (ONETOUCH DELICA PLUS [id number]) MISC USE TO CHECK BLOOD SUGAR DAILY  . losartan (COZAAR) 25 MG tablet Take 25 mg by mouth daily.  . Multiple Vitamins-Minerals (PRESERVISION AREDS) TABS Take 1 tablet by mouth in the morning and at bedtime.  Glory Rosebush VERIO test strip CHECK BLOOD GLUCOSE ONCE DAILY AS DIRECTED   No facility-administered encounter medications on file as of 11/25/2020.    Contacted Stacy at [facility] to inquire about denial of CGM. She states that she is not certain that a letter of medical necessity would work but she would be willing to submit if Dr. Damita Dunnings would write up. Letter will need to be faxed to Thurmond Butts at [facility] [phone].   Margaretmary Dys, [place] [facility] Assistant [phone]

## 2020-11-26 NOTE — Telephone Encounter (Signed)
Letter of medical necessity for CGM placed in Dr. Josefine Class folder in front office for signature. Please fax to the number below once signed by PCP.  [place] Phone: [phone] Fax: [phone]  Debbora Dus, PharmD Clinical Pharmacist [facility] at [facility] [phone]

## 2020-11-27 NOTE — Telephone Encounter (Signed)
Paperwork faxed as requested

## 2020-12-08 ENCOUNTER — Ambulatory Visit: Payer: Medicare Other | Admitting: *Deleted

## 2020-12-17 DIAGNOSIS — H353111 Nonexudative age-related macular degeneration, right eye, early dry stage: Secondary | ICD-10-CM | POA: Diagnosis not present

## 2020-12-25 DIAGNOSIS — N1832 Chronic kidney disease, stage 3b: Secondary | ICD-10-CM | POA: Diagnosis not present

## 2020-12-31 DIAGNOSIS — I10 Essential (primary) hypertension: Secondary | ICD-10-CM | POA: Diagnosis not present

## 2020-12-31 DIAGNOSIS — E1122 Type 2 diabetes mellitus with diabetic chronic kidney disease: Secondary | ICD-10-CM | POA: Diagnosis not present

## 2020-12-31 DIAGNOSIS — N1832 Chronic kidney disease, stage 3b: Secondary | ICD-10-CM | POA: Diagnosis not present

## 2021-01-01 ENCOUNTER — Encounter: Payer: Self-pay | Admitting: *Deleted

## 2021-01-15 DIAGNOSIS — H353211 Exudative age-related macular degeneration, right eye, with active choroidal neovascularization: Secondary | ICD-10-CM | POA: Diagnosis not present

## 2021-01-17 ENCOUNTER — Other Ambulatory Visit: Payer: Self-pay | Admitting: Family Medicine

## 2021-01-17 DIAGNOSIS — Z125 Encounter for screening for malignant neoplasm of prostate: Secondary | ICD-10-CM

## 2021-01-17 DIAGNOSIS — E119 Type 2 diabetes mellitus without complications: Secondary | ICD-10-CM

## 2021-01-28 ENCOUNTER — Other Ambulatory Visit: Payer: Self-pay

## 2021-01-28 ENCOUNTER — Other Ambulatory Visit (INDEPENDENT_AMBULATORY_CARE_PROVIDER_SITE_OTHER): Payer: Medicare Other

## 2021-01-28 DIAGNOSIS — N189 Chronic kidney disease, unspecified: Secondary | ICD-10-CM

## 2021-01-28 DIAGNOSIS — Z125 Encounter for screening for malignant neoplasm of prostate: Secondary | ICD-10-CM | POA: Diagnosis not present

## 2021-01-28 DIAGNOSIS — E119 Type 2 diabetes mellitus without complications: Secondary | ICD-10-CM | POA: Diagnosis not present

## 2021-01-28 LAB — HEPATIC FUNCTION PANEL
ALT: 17 U/L (ref 0–53)
AST: 17 U/L (ref 0–37)
Albumin: 4.4 g/dL (ref 3.5–5.2)
Alkaline Phosphatase: 77 U/L (ref 39–117)
Bilirubin, Direct: 0.1 mg/dL (ref 0.0–0.3)
Total Bilirubin: 0.6 mg/dL (ref 0.2–1.2)
Total Protein: 6.5 g/dL (ref 6.0–8.3)

## 2021-01-28 LAB — BASIC METABOLIC PANEL
BUN: 44 mg/dL — ABNORMAL HIGH (ref 6–23)
CO2: 25 mEq/L (ref 19–32)
Calcium: 9.4 mg/dL (ref 8.4–10.5)
Chloride: 103 mEq/L (ref 96–112)
Creatinine, Ser: 2.08 mg/dL — ABNORMAL HIGH (ref 0.40–1.50)
GFR: 30.73 mL/min — ABNORMAL LOW (ref >60.00)
Glucose, Bld: 109 mg/dL — ABNORMAL HIGH (ref 70–99)
Potassium: 4.4 mEq/L (ref 3.5–5.1)
Sodium: 136 mEq/L (ref 135–145)

## 2021-01-28 LAB — LIPID PANEL
Cholesterol: 166 mg/dL (ref 0–200)
HDL: 58.3 mg/dL (ref >39.00)
LDL Cholesterol: 101 mg/dL — ABNORMAL HIGH (ref 0–99)
NonHDL: 107.25
Total CHOL/HDL Ratio: 3
Triglycerides: 33 mg/dL (ref 0.0–149.0)
VLDL: 6.6 mg/dL (ref 0.0–40.0)

## 2021-01-28 LAB — PSA, MEDICARE: PSA: 0 ng/ml — ABNORMAL LOW (ref 0.10–4.00)

## 2021-01-28 LAB — HEMOGLOBIN A1C: Hgb A1c MFr Bld: 6.6 % — ABNORMAL HIGH (ref 4.6–6.5)

## 2021-02-04 ENCOUNTER — Ambulatory Visit (INDEPENDENT_AMBULATORY_CARE_PROVIDER_SITE_OTHER): Payer: Medicare Other | Admitting: Family Medicine

## 2021-02-04 ENCOUNTER — Other Ambulatory Visit: Payer: Self-pay

## 2021-02-04 ENCOUNTER — Encounter: Payer: Self-pay | Admitting: Family Medicine

## 2021-02-04 ENCOUNTER — Ambulatory Visit (INDEPENDENT_AMBULATORY_CARE_PROVIDER_SITE_OTHER): Payer: Medicare Other | Admitting: Dermatology

## 2021-02-04 VITALS — BP 108/66 | HR 66 | Temp 97.9°F | Ht 65.0 in | Wt 144.0 lb

## 2021-02-04 DIAGNOSIS — Z85828 Personal history of other malignant neoplasm of skin: Secondary | ICD-10-CM | POA: Diagnosis not present

## 2021-02-04 DIAGNOSIS — D1800 Hemangioma unspecified site: Secondary | ICD-10-CM

## 2021-02-04 DIAGNOSIS — L814 Other melanin hyperpigmentation: Secondary | ICD-10-CM | POA: Diagnosis not present

## 2021-02-04 DIAGNOSIS — Z0000 Encounter for general adult medical examination without abnormal findings: Secondary | ICD-10-CM | POA: Diagnosis not present

## 2021-02-04 DIAGNOSIS — L820 Inflamed seborrheic keratosis: Secondary | ICD-10-CM

## 2021-02-04 DIAGNOSIS — L570 Actinic keratosis: Secondary | ICD-10-CM | POA: Diagnosis not present

## 2021-02-04 DIAGNOSIS — E119 Type 2 diabetes mellitus without complications: Secondary | ICD-10-CM | POA: Diagnosis not present

## 2021-02-04 DIAGNOSIS — L578 Other skin changes due to chronic exposure to nonionizing radiation: Secondary | ICD-10-CM

## 2021-02-04 DIAGNOSIS — Z1283 Encounter for screening for malignant neoplasm of skin: Secondary | ICD-10-CM | POA: Diagnosis not present

## 2021-02-04 DIAGNOSIS — N1832 Chronic kidney disease, stage 3b: Secondary | ICD-10-CM

## 2021-02-04 DIAGNOSIS — D229 Melanocytic nevi, unspecified: Secondary | ICD-10-CM

## 2021-02-04 DIAGNOSIS — L821 Other seborrheic keratosis: Secondary | ICD-10-CM | POA: Diagnosis not present

## 2021-02-04 DIAGNOSIS — Z7189 Other specified counseling: Secondary | ICD-10-CM

## 2021-02-04 NOTE — Progress Notes (Signed)
Follow-Up Visit   Subjective  Guy [name] is a 75 y.o. male who presents for the following: Annual Exam (Hx of BCC and AK's. Patient has noticed a scaly lesion on the R upper arm, R cheek, and L neck that he would like checked and treated today. ).  The following portions of the chart were reviewed this encounter and updated as appropriate:   Tobacco  Allergies  Meds  Problems  Med Hx  Surg Hx  Fam Hx     Review of Systems:  No other skin or systemic complaints except as noted in HPI or Assessment and Plan.  Objective  Well appearing patient in no apparent distress; mood and affect are within normal limits.  A full examination was performed including scalp, head, eyes, ears, nose, lips, neck, chest, axillae, abdomen, back, buttocks, bilateral upper extremities, bilateral lower extremities, hands, feet, fingers, toes, fingernails, and toenails. All findings within normal limits unless otherwise noted below.  L jaw x 1, R cheek x 1 (2) Erythematous keratotic or waxy stuck-on papule or plaque.   L temple x 1 Erythematous thin papules/macules with gritty scale.   Assessment & Plan  Inflamed seborrheic keratosis L jaw x 1, R cheek x 1  Rubs when shaving.  Prior to procedure, discussed risks of blister formation, small wound, skin dyspigmentation, or rare scar following cryotherapy. Recommend Vaseline ointment to treated areas while healing.   Destruction of lesion - L jaw x 1, R cheek x 1 Complexity: simple   Destruction method: cryotherapy   Informed consent: discussed and consent obtained   Timeout:  patient name, date of birth, surgical site, and procedure verified Lesion destroyed using liquid nitrogen: Yes   Region frozen until ice ball extended beyond lesion: Yes   Outcome: patient tolerated procedure well with no complications   Post-procedure details: wound care instructions given    AK (actinic keratosis) L temple x 1  Prior to procedure, discussed risks of  blister formation, small wound, skin dyspigmentation, or rare scar following cryotherapy. Recommend Vaseline ointment to treated areas while healing.   Destruction of lesion - L temple x 1 Complexity: simple   Destruction method: cryotherapy   Informed consent: discussed and consent obtained   Timeout:  patient name, date of birth, surgical site, and procedure verified Lesion destroyed using liquid nitrogen: Yes   Region frozen until ice ball extended beyond lesion: Yes   Outcome: patient tolerated procedure well with no complications   Post-procedure details: wound care instructions given    Lentigines - Scattered tan macules - Due to sun exposure - Benign-appering, observe - Recommend daily broad spectrum sunscreen SPF 30+ to sun-exposed areas, reapply every 2 hours as needed. - Call for any changes  Seborrheic Keratoses - Stuck-on, waxy, tan-brown papules and/or plaques  - Benign-appearing - Discussed benign etiology and prognosis. - Observe - Call for any changes  Melanocytic Nevi - Tan-brown and/or pink-flesh-colored symmetric macules and papules - Benign appearing on exam today - Observation - Call clinic for new or changing moles - Recommend daily use of broad spectrum spf 30+ sunscreen to sun-exposed areas.   Hemangiomas - Red papules - Discussed benign nature - Observe - Call for any changes  Actinic Damage - Chronic condition, secondary to cumulative UV/sun exposure - diffuse scaly erythematous macules with underlying dyspigmentation - Recommend daily broad spectrum sunscreen SPF 30+ to sun-exposed areas, reapply every 2 hours as needed.  - Staying in the shade or wearing long sleeves, sun  glasses (UVA+UVB protection) and wide brim hats (4-inch brim around the entire circumference of the hat) are also recommended for sun protection.  - Call for new or changing lesions.  History of Basal Cell Carcinoma of the Skin - No evidence of recurrence today - Recommend  regular full body skin exams - Recommend daily broad spectrum sunscreen SPF 30+ to sun-exposed areas, reapply every 2 hours as needed.  - Call if any new or changing lesions are noted between office visits  Skin cancer screening performed today.  Return in about 1 year (around 02/04/2022) for TBSE - hx BCC, AK's ., 6 mo recheck AK  I, Rudell Cobb, CMA, am acting as scribe for Forest Gleason, MD .   Documentation: I have reviewed the above documentation for accuracy and completeness, and I agree with the above.  Forest Gleason, MD

## 2021-02-04 NOTE — Patient Instructions (Addendum)
Plan on recheck in about 3-4 months with A1c at the [place].  Take care.  Glad to see you. Update me as needed.  Thanks for your effort.

## 2021-02-04 NOTE — Patient Instructions (Signed)

## 2021-02-04 NOTE — Progress Notes (Signed)
This visit occurred during the SARS-CoV-2 public health emergency.  Safety protocols were in place, including screening questions prior to the visit, additional usage of staff PPE, and extensive cleaning of exam room while observing appropriate contact time as indicated for disinfecting solutions.  I have personally reviewed the Medicare Annual Wellness questionnaire and have noted 1. The patient's medical and social history 2. Their use of alcohol, tobacco or illicit drugs 3. Their current medications and supplements 4. The patient's functional ability including ADL's, fall risks, home safety risks and hearing or visual             impairment. 5. Diet and physical activities 6. Evidence for depression or mood disorders  The patients weight, height, BMI have been recorded in the chart and visual acuity is per eye clinic.  I have made referrals, counseling and provided education to the patient based review of the above and I have provided the pt with a written personalized care plan for preventive services.  Provider list updated- see scanned forms.  Routine anticipatory guidance given to patient.  See health maintenance. The possibility exists that previously documented standard health maintenance information may have been brought forward from a previous encounter into this note.  If needed, that same information has been updated to reflect the current situation based on today's encounter.    Flu to be done this fall Shingles discussed with patient PNA up-to-date Tetanus 2013 COVID-vaccine up-to-date. Colonoscopy 2018 Prostate cancer screening- he is going to f/u with urology, d/w pt.   Advance directive - Guy [name] designated patient were incapacitated. Cognitive function addressed- see scanned forms- and if abnormal then additional documentation follows.   In addition to [facility]ness, follow up visit for the below conditions:  Diabetes:  No meds.   Hypoglycemic episodes: down to  53 at night, but he is taking a snack at night to prevent that.   Hyperglycemic episodes:no Feet problems: no Blood Sugars averaging: usually ~100-120, with some mild dawn effect elevations.   eye exam within last year: yes A1c 6.6.  d/w pt.    CKD.  Will follow up in November.  Seeing Dr. Candiss [name].  Cr d/w pt.  Avoiding oral nsaids, d/w pt.  Cautions d/w pt.  Still on ARB.    PMH and SH reviewed  Meds, vitals, and allergies reviewed.   ROS: Per HPI.  Unless specifically indicated otherwise in HPI, the patient denies:  General: fever. Eyes: acute vision changes ENT: sore throat Cardiovascular: chest pain Respiratory: SOB GI: vomiting GU: dysuria Musculoskeletal: acute back pain Derm: acute rash Neuro: acute motor dysfunction Psych: worsening mood Endocrine: polydipsia Heme: bleeding Allergy: hayfever  GEN: nad, alert and oriented HEENT: ncat NECK: supple w/o LA CV: rrr. PULM: ctab, no inc wob ABD: soft, +bs EXT: no edema SKIN: no acute rash  Diabetic foot exam: Normal inspection No skin breakdown No calluses  Normal DP pulses Normal sensation to light touch and monofilament Nails normal

## 2021-02-07 NOTE — Assessment & Plan Note (Signed)
  Flu to be done this fall Shingles discussed with patient PNA up-to-date Tetanus 2013 COVID-vaccine up-to-date. Colonoscopy 2018 Prostate cancer screening- he is going to f/u with urology, d/w pt.   Advance directive - Guy [name] designated patient were incapacitated. Cognitive function addressed- see scanned forms- and if abnormal then additional documentation follows.

## 2021-02-07 NOTE — Assessment & Plan Note (Addendum)
Will follow up in November.  Seeing Dr. Candiss Norse.  Cr d/w pt.  Avoiding oral nsaids, d/w pt.  Cautions d/w pt.  Still on ARB, would continue.

## 2021-02-07 NOTE — Assessment & Plan Note (Addendum)
A1c 6.6.  d/w pt. reasonable control as is.  No change in medications this point.  He agrees.  Discussed trying to avoid hypoglycemia at night but taking a snack.  Recheck periodically.

## 2021-02-07 NOTE — Assessment & Plan Note (Signed)
Advance directive - [name] designated patient were incapacitated.

## 2021-02-12 ENCOUNTER — Encounter: Payer: Self-pay | Admitting: Dermatology

## 2021-02-26 DIAGNOSIS — H353211 Exudative age-related macular degeneration, right eye, with active choroidal neovascularization: Secondary | ICD-10-CM | POA: Diagnosis not present

## 2021-03-18 ENCOUNTER — Other Ambulatory Visit: Payer: Self-pay | Admitting: Family Medicine

## 2021-04-12 DIAGNOSIS — C61 Malignant neoplasm of prostate: Secondary | ICD-10-CM | POA: Diagnosis not present

## 2021-04-19 DIAGNOSIS — N5201 Erectile dysfunction due to arterial insufficiency: Secondary | ICD-10-CM | POA: Diagnosis not present

## 2021-04-19 DIAGNOSIS — C61 Malignant neoplasm of prostate: Secondary | ICD-10-CM | POA: Diagnosis not present

## 2021-04-19 DIAGNOSIS — N393 Stress incontinence (female) (male): Secondary | ICD-10-CM | POA: Diagnosis not present

## 2021-04-23 DIAGNOSIS — H353211 Exudative age-related macular degeneration, right eye, with active choroidal neovascularization: Secondary | ICD-10-CM | POA: Diagnosis not present

## 2021-04-28 DIAGNOSIS — R8290 Unspecified abnormal findings in urine: Secondary | ICD-10-CM | POA: Diagnosis not present

## 2021-04-28 DIAGNOSIS — N1832 Chronic kidney disease, stage 3b: Secondary | ICD-10-CM | POA: Diagnosis not present

## 2021-05-05 DIAGNOSIS — E1122 Type 2 diabetes mellitus with diabetic chronic kidney disease: Secondary | ICD-10-CM | POA: Diagnosis not present

## 2021-05-05 DIAGNOSIS — I10 Essential (primary) hypertension: Secondary | ICD-10-CM | POA: Diagnosis not present

## 2021-05-05 DIAGNOSIS — N1832 Chronic kidney disease, stage 3b: Secondary | ICD-10-CM | POA: Diagnosis not present

## 2021-05-10 ENCOUNTER — Ambulatory Visit (INDEPENDENT_AMBULATORY_CARE_PROVIDER_SITE_OTHER): Payer: Medicare Other | Admitting: Family Medicine

## 2021-05-10 ENCOUNTER — Other Ambulatory Visit: Payer: Self-pay

## 2021-05-10 ENCOUNTER — Encounter: Payer: Self-pay | Admitting: Family Medicine

## 2021-05-10 VITALS — BP 118/72 | HR 77 | Temp 98.0°F | Ht 65.0 in | Wt 146.0 lb

## 2021-05-10 DIAGNOSIS — E119 Type 2 diabetes mellitus without complications: Secondary | ICD-10-CM | POA: Diagnosis not present

## 2021-05-10 DIAGNOSIS — N189 Chronic kidney disease, unspecified: Secondary | ICD-10-CM | POA: Diagnosis not present

## 2021-05-10 LAB — POCT GLYCOSYLATED HEMOGLOBIN (HGB A1C): Hemoglobin A1C: 6.2 % — AB (ref 4.0–5.6)

## 2021-05-10 MED ORDER — PRAVASTATIN SODIUM 10 MG PO TABS: ORAL_TABLET | ORAL | 3 refills | Status: DC

## 2021-05-10 NOTE — Assessment & Plan Note (Signed)
A1c improved.  Add on pravastatin if tolerated.  Update me as needed.   Plan on recheck A1c in about 3-4 months.   I thanked him for his effort.   Continue work on diet and exercise.

## 2021-05-10 NOTE — Progress Notes (Signed)
This visit occurred during the SARS-CoV-2 public health emergency.  Safety protocols were in place, including screening questions prior to the visit, additional usage of staff PPE, and extensive cleaning of exam room while observing appropriate contact time as indicated for disinfecting solutions.  Diabetes:  No meds.   Hypoglycemic episodes:very rare down to ~70, cautions d/w pt   Hyperglycemic episodes:no Feet problems: no Blood Sugars averaging: usually ~100 early AM and slightly higher later in the day but improves after meals.   eye exam within last year:yes A1c 6.2.  he is working on diet.  We talked about being about to fix a meal for one person.   D/w pt about prev statin trial.  Would try 10mg  1-2 times a week.    CKD per renal.  He had recent follow up.  I'll defer.  He agrees.  Recent Cr improved, d/w pt.    Meds, vitals, and allergies reviewed.  ROS: Per HPI unless specifically indicated in ROS section   GEN: nad, alert and oriented HEENT: ncat NECK: supple w/o LA CV: rrr. PULM: ctab, no inc wob ABD: soft, +bs EXT: no edema SKIN: well perfused.

## 2021-05-10 NOTE — Assessment & Plan Note (Signed)
Per renal, see above.  He avoids nsaids.  Most recent Cr improved.

## 2021-05-10 NOTE — Patient Instructions (Signed)
Add on pravastatin if tolerated.  Update me as needed.   Plan on recheck A1c in about 3-4 months.   Take care.  Glad to see you.

## 2021-07-02 DIAGNOSIS — H353211 Exudative age-related macular degeneration, right eye, with active choroidal neovascularization: Secondary | ICD-10-CM | POA: Diagnosis not present

## 2021-07-29 ENCOUNTER — Other Ambulatory Visit: Payer: Self-pay

## 2021-07-29 ENCOUNTER — Encounter: Payer: Self-pay | Admitting: Dermatology

## 2021-07-29 ENCOUNTER — Ambulatory Visit (INDEPENDENT_AMBULATORY_CARE_PROVIDER_SITE_OTHER): Payer: Medicare Other | Admitting: Dermatology

## 2021-07-29 DIAGNOSIS — L570 Actinic keratosis: Secondary | ICD-10-CM | POA: Diagnosis not present

## 2021-07-29 DIAGNOSIS — L578 Other skin changes due to chronic exposure to nonionizing radiation: Secondary | ICD-10-CM

## 2021-07-29 NOTE — Patient Instructions (Addendum)
Cryotherapy Aftercare  Wash gently with soap and water everyday.   Apply Vaseline and Band-Aid daily until healed.   Prior to procedure, discussed risks of blister formation, small wound, skin dyspigmentation, or rare scar following cryotherapy. Recommend Vaseline ointment to treated areas while healing.   Recommend taking Heliocare sun protection supplement daily in sunny weather for additional sun protection. For maximum protection on the sunniest days, you can take up to 2 capsules of regular Heliocare OR take 1 capsule of Heliocare Ultra. For prolonged exposure (such as a full day in the sun), you can repeat your dose of the supplement 4 hours after your first dose. Heliocare can be purchased at [employer], at some [facility] or at [email or web address].    If You Need Anything After Your Visit  If you have any questions or concerns for your doctor, please call our main line at [phone] and press option 4 to reach your doctor's medical assistant. If no one answers, please leave a voicemail as directed and we will return your call as soon as possible. Messages left after 4 pm will be answered the following business day.   You may also send [place] a message via [place]. We typically respond to MyChart messages within 1-2 business days.  For prescription refills, please ask your pharmacy to contact our office. Our fax number is [phone].  If you have an urgent issue when the clinic is closed that cannot wait until the next business day, you can page your doctor at the number below.    Please note that while we do our best to be available for urgent issues outside of office hours, we are not available 24/7.   If you have an urgent issue and are unable to reach [place], you may choose to seek medical care at your doctor's office, retail clinic, urgent care center, or emergency room.  If you have a medical emergency, please immediately call 911 or go to the emergency department.  Pager  Numbers  - Dr. Nehemiah Massed: [phone]  - Dr. Laurence Ferrari: [phone]  - Dr. Nicole Kindred: [phone]  In the event of inclement weather, please call our main line at [phone] for an update on the status of any delays or closures.  Dermatology Medication Tips: Please keep the boxes that topical medications come in in order to help keep track of the instructions about where and how to use these. Pharmacies typically print the medication instructions only on the boxes and not directly on the medication tubes.   If your medication is too expensive, please contact our office at [phone] option 4 or send [place] a message through [place].   We are unable to tell what your co-pay for medications will be in advance as this is different depending on your insurance coverage. However, we may be able to find a substitute medication at lower cost or fill out paperwork to get insurance to cover a needed medication.   If a prior authorization is required to get your medication covered by your insurance company, please allow [place] 1-2 business days to complete this process.  Drug prices often vary depending on where the prescription is filled and some pharmacies may offer cheaper prices.  The website www.goodrx.com contains coupons for medications through different pharmacies. The prices here do not account for what the cost may be with help from insurance (it may be cheaper with your insurance), but the website can give you the price if you did not use any insurance.  - You can  print the associated coupon and take it with your prescription to the pharmacy.  - You may also stop by our office during regular business hours and pick up a GoodRx coupon card.  - If you need your prescription sent electronically to a different pharmacy, notify our office through [facility] or by phone at [phone] option 4.     Si Usted Necesita Algo Despus de Su Visita  Tambin puede enviarnos un mensaje a travs de  Pharmacist, community. Por lo general respondemos a los mensajes de MyChart en el transcurso de 1 a 2 das hbiles.  Para renovar recetas, por favor pida a su farmacia que se ponga en contacto con nuestra oficina. Harland Dingwall de fax es [place] [phone].  Si tiene un asunto urgente cuando la clnica est cerrada y que no puede esperar hasta el siguiente da hbil, puede llamar/localizar a su doctor(a) al nmero que aparece a continuacin.   Por favor, tenga en cuenta que aunque hacemos todo lo posible para estar disponibles para asuntos urgentes fuera del horario de [place], no estamos disponibles las 24 horas del da, los 7 das de la [place].   Si tiene un problema urgente y no puede comunicarse con nosotros, puede optar por buscar atencin mdica  en el consultorio de su doctor(a), en una clnica privada, en un centro de atencin urgente o en una sala de emergencias.  Si tiene Engineering geologist, por favor llame inmediatamente al 911 o vaya a la sala de emergencias.  Nmeros de bper  - Dr. Nehemiah Massed: [phone]  - Dra. Moye: [phone]  - Dra. Nicole Kindred: [phone]  En caso de inclemencias del [place], por favor llame a Johnsie Kindred principal al [phone] para una actualizacin sobre el [place] de cualquier retraso o cierre.  Consejos para la medicacin en dermatologa: Por favor, guarde las cajas en las que vienen los medicamentos de uso tpico para ayudarle a seguir las instrucciones sobre dnde y cmo usarlos. Las farmacias generalmente imprimen las instrucciones del medicamento slo en las cajas y no directamente en los tubos del [place].   Si su medicamento es muy caro, por favor, pngase en contacto con Zigmund Daniel llamando al [phone] y presione la opcin 4 o envenos un mensaje a travs de Pharmacist, community.   No podemos decirle cul ser su copago por los medicamentos por adelantado ya que esto es diferente dependiendo de la cobertura de su seguro. Sin embargo, es posible que  podamos encontrar un medicamento sustituto a Electrical engineer un formulario para que el seguro cubra el medicamento que se considera necesario.   Si se requiere una autorizacin previa para que su compaa de seguros [place] su medicamento, por favor permtanos de 1 a 2 das hbiles para completar este proceso.  Los precios de los medicamentos varan con frecuencia dependiendo del Environmental consultant de dnde se surte la receta y alguna farmacias pueden ofrecer precios ms baratos.  El sitio web www.goodrx.com tiene cupones para medicamentos de Airline pilot. Los precios aqu no tienen en cuenta lo que podra costar con la ayuda del seguro (puede ser ms barato con su seguro), pero el sitio web puede darle el precio si no utiliz Research scientist (physical sciences).  - Puede imprimir el cupn correspondiente y llevarlo con su receta a la farmacia.  - Tambin puede pasar por nuestra oficina durante el horario de atencin regular y Charity fundraiser una tarjeta de cupones de GoodRx.  - Si necesita que su receta se enve electrnicamente a una farmacia diferente, informe a [place]  oficina a travs de MyChart [place] o por telfono llamando al [phone] y presione la opcin 4.

## 2021-07-29 NOTE — Progress Notes (Signed)
° °  Follow-Up Visit   Subjective  Guy [name] is a 76 y.o. male who presents for the following: Follow up (6 month AK recheck. Left temple. Resolved, per patient).  The patient has spots, moles and lesions to be evaluated, some may be new or changing and the patient has concerns that these could be cancer.  The following portions of the chart were reviewed this encounter and updated as appropriate:  Tobacco   Allergies   Meds   Problems   Med Hx   Surg Hx   Fam Hx       Review of Systems: No other skin or systemic complaints except as noted in HPI or Assessment and Plan.   Objective  Well appearing patient in no apparent distress; mood and affect are within normal limits.  A focused examination was performed including head, including the scalp, face, neck, nose, ears, eyelids, and lips. Relevant physical exam findings are noted in the Assessment and Plan.  Right Angle of  Mandible x1 Erythematous thin papules/macules with gritty scale.    Assessment & Plan  AK (actinic keratosis) Right Angle of  Mandible x1  Actinic keratoses are precancerous spots that appear secondary to cumulative UV radiation exposure/sun exposure over time. They are chronic with expected duration over 1 year. A portion of actinic keratoses will progress to squamous cell carcinoma of the skin. It is not possible to reliably predict which spots will progress to skin cancer and so treatment is recommended to prevent development of skin cancer.  Recommend daily broad spectrum sunscreen SPF 30+ to sun-exposed areas, reapply every 2 hours as needed.  Recommend staying in the shade or wearing long sleeves, sun glasses (UVA+UVB protection) and wide brim hats (4-inch brim around the entire circumference of the hat). Call for new or changing lesions.  Prior to procedure, discussed risks of blister formation, small wound, skin dyspigmentation, or rare scar following cryotherapy. Recommend Vaseline ointment to treated  areas while healing.   Destruction of lesion - Right Angle of  Mandible x1  Destruction method: cryotherapy   Informed consent: discussed and consent obtained   Lesion destroyed using liquid nitrogen: Yes   Outcome: patient tolerated procedure well with no complications   Post-procedure details: wound care instructions given     Actinic Damage - chronic, secondary to cumulative UV radiation exposure/sun exposure over time - diffuse scaly erythematous macules with underlying dyspigmentation - Recommend daily broad spectrum sunscreen SPF 30+ to sun-exposed areas, reapply every 2 hours as needed.  - Recommend staying in the shade or wearing long sleeves, sun glasses (UVA+UVB protection) and wide brim hats (4-inch brim around the entire circumference of the hat). - Call for new or changing lesions.  Return for TBSE As Scheduled.  I, Emelia Salisbury, CMA, am acting as scribe for Forest Gleason, MD.  Documentation: I have reviewed the above documentation for accuracy and completeness, and I agree with the above.  Forest Gleason, MD

## 2021-08-07 ENCOUNTER — Encounter: Payer: Self-pay | Admitting: Dermatology

## 2021-08-12 ENCOUNTER — Encounter: Payer: Self-pay | Admitting: Family Medicine

## 2021-08-12 ENCOUNTER — Ambulatory Visit (INDEPENDENT_AMBULATORY_CARE_PROVIDER_SITE_OTHER): Payer: Medicare Other | Admitting: Family Medicine

## 2021-08-12 ENCOUNTER — Other Ambulatory Visit: Payer: Self-pay

## 2021-08-12 VITALS — BP 120/80 | HR 77 | Temp 98.4°F | Ht 65.0 in | Wt 149.0 lb

## 2021-08-12 DIAGNOSIS — E119 Type 2 diabetes mellitus without complications: Secondary | ICD-10-CM | POA: Diagnosis not present

## 2021-08-12 LAB — POCT GLYCOSYLATED HEMOGLOBIN (HGB A1C): Hemoglobin A1C: 6.3 % — AB (ref 4.0–5.6)

## 2021-08-12 MED ORDER — PRAVASTATIN SODIUM 10 MG PO TABS: ORAL_TABLET | ORAL | Status: DC

## 2021-08-12 NOTE — Patient Instructions (Addendum)
Plan on recheck in about 3 months with A1c at the visit.  Take care.  Glad to see you. Thanks for your effort.

## 2021-08-12 NOTE — Progress Notes (Signed)
This visit occurred during the SARS-CoV-2 public health emergency.  Safety protocols were in place, including screening questions prior to the visit, additional usage of staff PPE, and extensive cleaning of exam room while observing appropriate contact time as indicated for disinfecting solutions.  Diabetes:  Using medications without difficulties: no meds.   Hypoglycemic episodes: rare low, d/w pt about cautions.  He has CGM to help monitor.  This is less often than prev.  Hyperglycemic episodes: no Feet problems:no  Blood Sugars averaging: usually ~100-120s in the AMs.   eye exam within last year: yes A1c d/w pt at [place].  Controlled and stable He is taking pravastatin daily with hand pain attributed to OA and not statin use.  He gradually worked up to the dose.    His brother has prostate cancer.  D/w pt.  Patient is helping his brother in the meantime.    Meds, vitals, and allergies reviewed.  ROS: Per HPI unless specifically indicated in ROS section   GEN: nad, alert and oriented HEENT: ncat NECK: supple w/o LA CV: rrr. PULM: ctab, no inc wob ABD: soft, +bs EXT: no edema SKIN: well perfused.

## 2021-08-16 NOTE — Assessment & Plan Note (Signed)
A1c d/w pt at [facility].  Controlled and stable.  He is taking pravastatin daily with hand pain attributed to OA and not statin use.  He gradually worked up to the dose.   Would continue pravastatin as is. Plan on recheck in about 3 months with A1c at the visit.  Continue work on diet and exercise in the meantime.

## 2021-08-27 DIAGNOSIS — H353211 Exudative age-related macular degeneration, right eye, with active choroidal neovascularization: Secondary | ICD-10-CM | POA: Diagnosis not present

## 2021-08-29 ENCOUNTER — Other Ambulatory Visit: Payer: Self-pay | Admitting: Family Medicine

## 2021-09-22 ENCOUNTER — Encounter: Payer: Self-pay | Admitting: Family Medicine

## 2021-09-22 NOTE — Telephone Encounter (Signed)
Please send the order per patient request.  Thanks. ?

## 2021-09-23 MED ORDER — FREESTYLE LIBRE 3 SENSOR MISC: 3 refills | Status: DC

## 2021-10-18 DIAGNOSIS — H353211 Exudative age-related macular degeneration, right eye, with active choroidal neovascularization: Secondary | ICD-10-CM | POA: Diagnosis not present

## 2021-10-27 DIAGNOSIS — E1122 Type 2 diabetes mellitus with diabetic chronic kidney disease: Secondary | ICD-10-CM | POA: Diagnosis not present

## 2021-10-27 DIAGNOSIS — I10 Essential (primary) hypertension: Secondary | ICD-10-CM | POA: Diagnosis not present

## 2021-10-27 DIAGNOSIS — N1832 Chronic kidney disease, stage 3b: Secondary | ICD-10-CM | POA: Diagnosis not present

## 2021-11-01 ENCOUNTER — Encounter: Payer: Self-pay | Admitting: Family Medicine

## 2021-11-01 ENCOUNTER — Ambulatory Visit (INDEPENDENT_AMBULATORY_CARE_PROVIDER_SITE_OTHER): Payer: Medicare Other | Admitting: Family Medicine

## 2021-11-01 VITALS — BP 118/78 | HR 83 | Temp 97.8°F | Ht 65.0 in | Wt 149.0 lb

## 2021-11-01 DIAGNOSIS — E119 Type 2 diabetes mellitus without complications: Secondary | ICD-10-CM

## 2021-11-01 LAB — POCT GLYCOSYLATED HEMOGLOBIN (HGB A1C): Hemoglobin A1C: 6.4 % — AB (ref 4.0–5.6)

## 2021-11-01 MED ORDER — ONETOUCH DELICA PLUS LANCET33G MISC: 3 refills | Status: DC

## 2021-11-01 MED ORDER — ONETOUCH VERIO VI STRP: ORAL_STRIP | 3 refills | Status: DC

## 2021-11-01 NOTE — Progress Notes (Signed)
Diabetes:  ?No meds.   ?Hypoglycemic episodes: not usually, very rarely down to 60s but he hadn't calibrated his meter and it may be a falsely low reading.  D/w pt about calibration.   ?Hyperglycemic episodes: no ?Feet problems: no ?Blood Sugars averaging: usually 110s-120 in the early AM.  Slightly higher after getting out of bed for a little time.   ?eye exam within last year: yes ?Sugar up to 200 after meals but then it comes down.  ? ?Brother has prostate cancer and h/o agent orange exposure.   ?He has cataract surgery pending.   ? ?Cr 2.2 recently drawn by renal clinic with f/u pending.  D/w pt.  Rare use of voltaren gel and I wouldn't think that would contribute, no recently use.  D/w pt.   ? ?Meds, vitals, and allergies reviewed.  ? ?ROS: Per HPI unless specifically indicated in ROS section  ? ?GEN: nad, alert and oriented ?HEENT: ncat ?NECK: supple w/o LA ?CV: rrr. ?PULM: ctab, no inc wob ?ABD: soft, +bs ?EXT: no edema ?SKIN: Well-perfused. ?

## 2021-11-01 NOTE — Patient Instructions (Addendum)
A1c 6.4.   ?Update me as needed.  ?Take care.  Glad to see you. ?Plan on recheck this fall at a yearly visit.  ?Thanks for your effort.   ?

## 2021-11-02 DIAGNOSIS — N2581 Secondary hyperparathyroidism of renal origin: Secondary | ICD-10-CM | POA: Diagnosis not present

## 2021-11-02 DIAGNOSIS — E1122 Type 2 diabetes mellitus with diabetic chronic kidney disease: Secondary | ICD-10-CM | POA: Diagnosis not present

## 2021-11-02 DIAGNOSIS — I10 Essential (primary) hypertension: Secondary | ICD-10-CM | POA: Diagnosis not present

## 2021-11-02 DIAGNOSIS — N1832 Chronic kidney disease, stage 3b: Secondary | ICD-10-CM | POA: Diagnosis not present

## 2021-11-03 NOTE — Assessment & Plan Note (Signed)
A1c 6.4.  Discussed with patient at office visit.  I thanked him for his effort.  No change in medications at this point.  Recheck periodically. ?He is going to follow-up with renal clinic per routine. ?

## 2021-11-12 ENCOUNTER — Ambulatory Visit (INDEPENDENT_AMBULATORY_CARE_PROVIDER_SITE_OTHER)
Admission: RE | Admit: 2021-11-12 | Discharge: 2021-11-12 | Disposition: A | Discharge status: Home / Self Care | Payer: Medicare Other | Source: Ambulatory Visit | Attending: Family Medicine | Admitting: Family Medicine

## 2021-11-12 ENCOUNTER — Ambulatory Visit (INDEPENDENT_AMBULATORY_CARE_PROVIDER_SITE_OTHER): Payer: Medicare Other | Admitting: Family Medicine

## 2021-11-12 ENCOUNTER — Encounter: Payer: Self-pay | Admitting: Family Medicine

## 2021-11-12 VITALS — BP 112/78 | HR 72 | Temp 97.3°F | Ht 65.0 in | Wt 150.0 lb

## 2021-11-12 DIAGNOSIS — R6883 Chills (without fever): Secondary | ICD-10-CM

## 2021-11-12 DIAGNOSIS — M25551 Pain in right hip: Secondary | ICD-10-CM

## 2021-11-12 MED ORDER — VITAMIN D3 50 MCG (2000 UT) PO CAPS: 2000.0000 [IU] | ORAL_CAPSULE | Freq: Every day | ORAL | Status: AC

## 2021-11-12 NOTE — Patient Instructions (Addendum)
Likely a benign viral illness and you should get better.  Rest and fluids.  Update me as needed.  I would expect your sugar to normalize over the next few days.    Go to the lab on the way out.   If you have mychart we'll likely use that to update you.     Take care.  Glad to see you.

## 2021-11-12 NOTE — Progress Notes (Unsigned)
Sx started about 1 week ago.  Was playing golf and noted some aches.  Had diffuse joint pain, HA.  Chills at night.  Nausea vs dec appetite episodically.  He had a sweat last night.  Taking tylenol for pain, temporarily.  Today is the best day he has had recently.  Had a neg covid test.  He had R>L hip pain in the meantime.  No tick bites.  No one else is sick.  Taking fluids.  Urine has been clear the last few days.  D/w pt about taking enough fluid.    He is having more R hip pain and had seen ortho in the past.  D/w pt about options and recheck xray today.  H/o R hip replacement.   Meds, vitals, and allergies reviewed.   ROS: Per HPI unless specifically indicated in ROS section   OP wnl TM wnl Rrr Ctab Abd soft, not ttp.   R hip with pain on ext rotation and flexion, less on int rotation.

## 2021-11-15 DIAGNOSIS — R6883 Chills (without fever): Secondary | ICD-10-CM | POA: Insufficient documentation

## 2021-11-15 DIAGNOSIS — M25551 Pain in right hip: Secondary | ICD-10-CM | POA: Insufficient documentation

## 2021-11-15 NOTE — Assessment & Plan Note (Signed)
Reasonable to check plain films.  See notes on imaging.

## 2021-11-15 NOTE — Assessment & Plan Note (Signed)
History of.  Nontoxic.  Still okay for outpatient follow-up.  Differential diagnosis discussed with patient. Likely a benign viral illness and he should get better.  Rest and fluids.  Update me as needed.  He agrees to plan.

## 2021-12-06 DIAGNOSIS — H353221 Exudative age-related macular degeneration, left eye, with active choroidal neovascularization: Secondary | ICD-10-CM | POA: Diagnosis not present

## 2021-12-06 DIAGNOSIS — H353211 Exudative age-related macular degeneration, right eye, with active choroidal neovascularization: Secondary | ICD-10-CM | POA: Diagnosis not present

## 2021-12-14 DIAGNOSIS — H2511 Age-related nuclear cataract, right eye: Secondary | ICD-10-CM | POA: Diagnosis not present

## 2021-12-16 ENCOUNTER — Encounter: Payer: Self-pay | Admitting: Ophthalmology

## 2021-12-23 NOTE — Discharge Instructions (Signed)

## 2021-12-28 ENCOUNTER — Ambulatory Visit: Payer: Medicare Other | Admitting: Anesthesiology

## 2021-12-28 ENCOUNTER — Other Ambulatory Visit: Payer: Self-pay

## 2021-12-28 ENCOUNTER — Ambulatory Visit
Admission: RE | Admit: 2021-12-28 | Discharge: 2021-12-28 | Disposition: A | Discharge status: Home / Self Care | Payer: Medicare Other | Attending: Ophthalmology | Admitting: Ophthalmology

## 2021-12-28 ENCOUNTER — Encounter
Admission: RE | Disposition: A | Discharge status: Home / Self Care | Payer: Self-pay | Source: Home / Self Care | Attending: Ophthalmology

## 2021-12-28 ENCOUNTER — Encounter: Payer: Self-pay | Admitting: Ophthalmology

## 2021-12-28 DIAGNOSIS — H2511 Age-related nuclear cataract, right eye: Secondary | ICD-10-CM | POA: Insufficient documentation

## 2021-12-28 DIAGNOSIS — K449 Diaphragmatic hernia without obstruction or gangrene: Secondary | ICD-10-CM | POA: Diagnosis not present

## 2021-12-28 DIAGNOSIS — E1122 Type 2 diabetes mellitus with diabetic chronic kidney disease: Secondary | ICD-10-CM | POA: Diagnosis not present

## 2021-12-28 DIAGNOSIS — N183 Chronic kidney disease, stage 3 unspecified: Secondary | ICD-10-CM | POA: Insufficient documentation

## 2021-12-28 DIAGNOSIS — F419 Anxiety disorder, unspecified: Secondary | ICD-10-CM | POA: Diagnosis not present

## 2021-12-28 DIAGNOSIS — I129 Hypertensive chronic kidney disease with stage 1 through stage 4 chronic kidney disease, or unspecified chronic kidney disease: Secondary | ICD-10-CM | POA: Insufficient documentation

## 2021-12-28 DIAGNOSIS — M1990 Unspecified osteoarthritis, unspecified site: Secondary | ICD-10-CM | POA: Diagnosis not present

## 2021-12-28 DIAGNOSIS — Z85828 Personal history of other malignant neoplasm of skin: Secondary | ICD-10-CM | POA: Diagnosis not present

## 2021-12-28 DIAGNOSIS — Z8719 Personal history of other diseases of the digestive system: Secondary | ICD-10-CM | POA: Insufficient documentation

## 2021-12-28 DIAGNOSIS — N189 Chronic kidney disease, unspecified: Secondary | ICD-10-CM | POA: Diagnosis not present

## 2021-12-28 DIAGNOSIS — Z8546 Personal history of malignant neoplasm of prostate: Secondary | ICD-10-CM | POA: Diagnosis not present

## 2021-12-28 DIAGNOSIS — K219 Gastro-esophageal reflux disease without esophagitis: Secondary | ICD-10-CM | POA: Diagnosis not present

## 2021-12-28 DIAGNOSIS — E1136 Type 2 diabetes mellitus with diabetic cataract: Secondary | ICD-10-CM | POA: Diagnosis not present

## 2021-12-28 DIAGNOSIS — C61 Malignant neoplasm of prostate: Secondary | ICD-10-CM | POA: Diagnosis not present

## 2021-12-28 HISTORY — PX: CATARACT EXTRACTION W/PHACO: SHX586

## 2021-12-28 SURGERY — PHACOEMULSIFICATION, CATARACT, WITH IOL INSERTION
Anesthesia: Monitor Anesthesia Care | Site: Eye | Laterality: Right

## 2021-12-28 MED ORDER — FENTANYL CITRATE (PF) 100 MCG/2ML IJ SOLN
INTRAMUSCULAR | Status: DC | PRN
Start: 2021-12-28 — End: 2021-12-28
  Administered 2021-12-28: 50 ug via INTRAVENOUS

## 2021-12-28 MED ORDER — TETRACAINE HCL 0.5 % OP SOLN
1.0000 [drp] | OPHTHALMIC | Status: AC | PRN
  Administered 2021-12-28 (×3): 1 [drp] via OPHTHALMIC

## 2021-12-28 MED ORDER — SIGHTPATH DOSE#1 BSS IO SOLN
INTRAOCULAR | Status: DC | PRN
  Administered 2021-12-28: 1 mL via INTRAMUSCULAR

## 2021-12-28 MED ORDER — ACETAMINOPHEN 325 MG PO TABS: 650.0000 mg | ORAL_TABLET | ORAL | Status: DC | PRN

## 2021-12-28 MED ORDER — MIDAZOLAM HCL 2 MG/2ML IJ SOLN
INTRAMUSCULAR | Status: DC | PRN
  Administered 2021-12-28: 2 mg via INTRAVENOUS

## 2021-12-28 MED ORDER — ACETAMINOPHEN 160 MG/5ML PO SOLN: 325.0000 mg | ORAL | Status: DC | PRN

## 2021-12-28 MED ORDER — SIGHTPATH DOSE#1 BSS IO SOLN
INTRAOCULAR | Status: DC | PRN
  Administered 2021-12-28: 69 mL via OPHTHALMIC

## 2021-12-28 MED ORDER — SIGHTPATH DOSE#1 NA CHONDROIT SULF-NA HYALURON 40-17 MG/ML IO SOLN
INTRAOCULAR | Status: DC | PRN
  Administered 2021-12-28: 1 mL via INTRAOCULAR

## 2021-12-28 MED ORDER — BRIMONIDINE TARTRATE-TIMOLOL 0.2-0.5 % OP SOLN
OPHTHALMIC | Status: DC | PRN
  Administered 2021-12-28: 1 [drp] via OPHTHALMIC

## 2021-12-28 MED ORDER — MOXIFLOXACIN HCL 0.5 % OP SOLN
OPHTHALMIC | Status: DC | PRN
  Administered 2021-12-28: 0.2 mL via OPHTHALMIC

## 2021-12-28 MED ORDER — ARMC OPHTHALMIC DILATING DROPS
1.0000 | OPHTHALMIC | Status: AC | PRN
  Administered 2021-12-28 (×3): 1 via OPHTHALMIC

## 2021-12-28 MED ORDER — SIGHTPATH DOSE#1 BSS IO SOLN
INTRAOCULAR | Status: DC | PRN
  Administered 2021-12-28: 15 mL

## 2021-12-28 MED ORDER — ONDANSETRON HCL 4 MG/2ML IJ SOLN: 4.0000 mg | Freq: Once | INTRAMUSCULAR | Status: DC | PRN

## 2021-12-28 MED ORDER — LACTATED RINGERS IV SOLN: INTRAVENOUS | Status: DC

## 2021-12-28 SURGICAL SUPPLY — 10 items
CATARACT SUITE SIGHTPATH (MISCELLANEOUS) ×2 IMPLANT
FEE CATARACT SUITE SIGHTPATH (MISCELLANEOUS) ×1 IMPLANT
GLOVE SURG ENC TEXT LTX SZ8 (GLOVE) ×2 IMPLANT
GLOVE SURG TRIUMPH 8.0 PF LTX (GLOVE) ×2 IMPLANT
LENS IOL TECNIS EYHANCE 21.0 (Intraocular Lens) ×1 IMPLANT
NDL FILTER BLUNT 18X1 1/2 (NEEDLE) ×1 IMPLANT
NEEDLE FILTER BLUNT 18X 1/2SAF (NEEDLE) ×1
NEEDLE FILTER BLUNT 18X1 1/2 (NEEDLE) ×1 IMPLANT
SYR 3ML LL SCALE MARK (SYRINGE) ×2 IMPLANT
WATER STERILE IRR 250ML POUR (IV SOLUTION) ×2 IMPLANT

## 2021-12-28 NOTE — Anesthesia Preprocedure Evaluation (Signed)
Anesthesia Evaluation  Patient identified by MRN, date of birth, ID band Patient awake    Reviewed: Allergy & Precautions, NPO status , Patient's Chart, lab work & pertinent test results, reviewed documented beta blocker date and time   History of Anesthesia Complications (+) DIFFICULT AIRWAY and history of anesthetic complications  Airway Mallampati: II  TM Distance: >3 FB Neck ROM: Limited    Dental   Pulmonary    breath sounds clear to auscultation       Cardiovascular hypertension, (-) angina(-) DOE  Rhythm:Regular Rate:Normal   HLD   Neuro/Psych Anxiety    GI/Hepatic hiatal hernia, GERD  ,  Endo/Other  diabetes, Type 2  Renal/GU CRFRenal disease     Musculoskeletal  (+) Arthritis ,   Abdominal   Peds  Hematology   Anesthesia Other Findings Basal cell carcinoma Prostate cancer  Reproductive/Obstetrics                             Anesthesia Physical Anesthesia Plan  ASA: 2  Anesthesia Plan: MAC   Post-op Pain Management:    Induction: Intravenous  PONV Risk Score and Plan: 1 and TIVA, Midazolam and Treatment may vary due to age or medical condition  Airway Management Planned: Nasal Cannula  Additional Equipment:   Intra-op Plan:   Post-operative Plan:   Informed Consent: I have reviewed the patients History and Physical, chart, labs and discussed the procedure including the risks, benefits and alternatives for the proposed anesthesia with the patient or authorized representative who has indicated his/her understanding and acceptance.       Plan Discussed with: CRNA and Anesthesiologist  Anesthesia Plan Comments:         Anesthesia Quick Evaluation

## 2021-12-28 NOTE — Op Note (Signed)
PREOPERATIVE DIAGNOSIS:  Nuclear sclerotic cataract of the right eye.   POSTOPERATIVE DIAGNOSIS:  H25.11 Cataract   OPERATIVE PROCEDURE:ORPROCALL@   SURGEON:  Birder Robson, MD.   ANESTHESIA:  Anesthesiologist: Heniser, Fredric Dine, MD CRNA: Silvana Newness, CRNA  1.      Managed anesthesia care. 2.      0.45m of Shugarcaine was instilled in the eye following the paracentesis.   COMPLICATIONS:  None.   TECHNIQUE:   Stop and chop   DESCRIPTION OF PROCEDURE:  The patient was examined and consented in the preoperative holding area where the aforementioned topical anesthesia was applied to the right eye and then brought back to the Operating Room where the right eye was prepped and draped in the usual sterile ophthalmic fashion and a lid speculum was placed. A paracentesis was created with the side port blade and the anterior chamber was filled with viscoelastic. A near clear corneal incision was performed with the steel keratome. A continuous curvilinear capsulorrhexis was performed with a cystotome followed by the capsulorrhexis forceps. Hydrodissection and hydrodelineation were carried out with BSS on a blunt cannula. The lens was removed in a stop and chop  technique and the remaining cortical material was removed with the irrigation-aspiration handpiece. The capsular bag was inflated with viscoelastic and the Technis [id number]  lens was placed in the capsular bag without complication. The remaining viscoelastic was removed from the eye with the irrigation-aspiration handpiece. The wounds were hydrated. The anterior chamber was flushed with BSS and the eye was inflated to physiologic pressure. 0.173mof Vigamox was placed in the anterior chamber. The wounds were found to be water tight. The eye was dressed with Combigan. The patient was given protective glasses to wear throughout the day and a shield with which to sleep tonight. The patient was also given drops with which to begin a drop regimen today  and will follow-up with me in one day. Implant Name Type Inv. Item Serial No. Manufacturer Lot No. LRB No. Used Action  LENS IOL TECNIS EYHANCE 21.0 - [id number] Lens LENS IOL TECNIS EYHANCE 21.0 [id number]  Right 1 Implanted   Procedure(s): CATARACT EXTRACTION PHACO AND INTRAOCULAR LENS PLACEMENT (IOC) RIGHT DIABETIC 6.34 00:50.8 (Right)  Electronically signed: WiBirder Robson/04/2022 9:54 AM

## 2021-12-28 NOTE — Transfer of Care (Signed)
Immediate Anesthesia Transfer of Care Note  Patient: Guy [name]  Procedure(s) Performed: CATARACT EXTRACTION PHACO AND INTRAOCULAR LENS PLACEMENT (IOC) RIGHT DIABETIC 6.34 00:50.8 (Right: Eye)  Patient Location: PACU  Anesthesia Type: MAC  Level of Consciousness: awake, alert  and patient cooperative  Airway and Oxygen Therapy: Patient Spontanous Breathing and Patient connected to supplemental oxygen  Post-op Assessment: Post-op Vital signs reviewed, Patient's Cardiovascular Status Stable, Respiratory Function Stable, Patent Airway and No signs of Nausea or vomiting  Post-op Vital Signs: Reviewed and stable  Complications: No notable events documented.

## 2021-12-28 NOTE — H&P (Signed)
[facility]   Primary Care Physician:  Tonia Ghent, MD Ophthalmologist: Dr. Hortense Ramal  Pre-Procedure History & Physical: HPI:  Guy [name] is a 76 y.o. male here for cataract surgery.   Past Medical History:  Diagnosis Date   Actinic keratosis    Anxiety    not clinical   Arthritis    Basal cell carcinoma    Right nasal ala, right temple, nasal dorsum   Cataract    Chronic kidney disease    Stage 3   Diabetes mellitus type II    Diet controlled   Difficult intubation    Noted 02/16/18. Pt unaware of any issue - 12/16/21   Elevated PSA    GERD (gastroesophageal reflux disease)    History of basal cell carcinoma excision    1990's--  moh's surg. of nose   History of hiatal hernia    Neck complaint    "stiffness, catches"-denies numbness, tingling of arms   Prostate cancer ([place]) 2016    Past Surgical History:  Procedure Laterality Date   CHOLECYSTECTOMY N/A 02/16/2018   Procedure: LAPAROSCOPIC CHOLECYSTECTOMY;  Surgeon: Vickie Epley, MD;  Location: [facility];  Service: General;  Laterality: N/A;   COLONOSCOPY  06-01-2011   COLONOSCOPY WITH PROPOFOL N/A 12/06/2016   Procedure: COLONOSCOPY WITH PROPOFOL;  Surgeon: [name], MD;  Location: [facility];  Service: Endoscopy;  Laterality: N/A;   ESOPHAGOGASTRODUODENOSCOPY  07/29/05   [facility]   JOINT REPLACEMENT     LYMPHADENECTOMY N/A 05/06/2015   Procedure: LYMPH NODE DISSECTION;  Surgeon: Alexis Frock, MD;  Location: [facility];  Service: Urology;  Laterality: N/A;   MOHS SURGERY  1990's   nose   PROSTATE BIOPSY  2014   PROSTATE BIOPSY N/A 03/06/2015   Procedure: SATURATION BIOPSY TRANSRECTAL ULTRASONIC PROSTATE (TUBP);  Surgeon: Alexis Frock, MD;  Location: [facility];  Service: Urology;  Laterality: N/A;   ROBOT ASSISTED LAPAROSCOPIC RADICAL PROSTATECTOMY N/A 05/06/2015   Procedure: ROBOTIC ASSISTED LAPAROSCOPIC RADICAL PROSTATECTOMY;  Surgeon: Alexis Frock, MD;  Location: [facility];   Service: Urology;  Laterality: N/A;   TONSILLECTOMY  as a child   TOTAL HIP ARTHROPLASTY Right 08/ 2000    Prior to Admission medications   Medication Sig Start Date End Date Taking? Authorizing Provider  acetaminophen (TYLENOL) 500 MG tablet Take 1,000 mg by mouth 2 (two) times daily.    Yes [provider]  Cholecalciferol (VITAMIN D3) 50 MCG (2000 UT) capsule Take 1 capsule (2,000 Units total) by mouth daily. 11/12/21  Yes Tonia Ghent, MD  diclofenac sodium (VOLTAREN) 1 % GEL Apply topically 4 (four) times daily as needed.   Yes [provider]  famotidine (PEPCID) 10 MG tablet Take 10 mg by mouth daily.   Yes [provider]  losartan (COZAAR) 25 MG tablet Take 25 mg by mouth daily. 08/06/19 12/28/21 Yes [provider]  Multiple Vitamins-Minerals (PRESERVISION AREDS) TABS Take 1 tablet by mouth in the morning and at bedtime.   Yes [provider]  pravastatin (PRAVACHOL) 10 MG tablet Try taking 1 tab daily 08/12/21  Yes Tonia Ghent, MD  Blood Glucose Monitoring Suppl (ONETOUCH VERIO) w/Device KIT 1 kit by Other route daily. Use as instructed to check blood sugar once daily and as needed.  Diagnosis:  E11.9  Non insulin dependent. 08/22/17   Tonia Ghent, MD  Continuous Blood Gluc Sensor (FREESTYLE LIBRE 3 SENSOR) MISC Place 1 sensor on the skin every 14  days. Use to check glucose continuously. DX E11.9 09/23/21   Joaquim Nam, MD  glucose blood (ONETOUCH VERIO) test strip CHECK BLOOD GLUCOSE ONCE DAILY AS DIRECTED. Dx E11.9. 11/01/21   Joaquim Nam, MD  Lancets [facility] DELICA PLUS LANCET33G) MISC USE TO CHECK BLOOD SUGAR DAILY. Dx E11.9. 11/01/21   Joaquim Nam, MD    Allergies as of 10/20/2021 - Review Complete 08/12/2021  Allergen Reaction Noted   Melatonin  09/07/2020   Nsaids Other (See Comments) 01/09/2018   Pravastatin Other (See Comments) 07/20/2016    Family History  Problem Relation Age of Onset   COPD Mother         smoker   Cancer Mother        unknown primary   Alzheimer's disease Father    Hypertension Father    Diabetes Father    Obesity Brother    Hypertension Brother    Diabetes Brother    Heart disease Brother        heart valve   Prostate cancer Neg Hx    Colon cancer Neg Hx     Social History   Socioeconomic History   Marital status: Widowed    Spouse name: Not on file   Number of children: 1   Years of education: Not on file   Highest education level: Not on file  Occupational History   Occupation: Retired 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)   Married 1980, widowed 2014   1 step daughter    Herbalist   Retired from [employer]   Social Determinants of [employer]: Low Risk  (02/07/2020)   Overall Financial Resource Strain (CARDIA)    Difficulty of Paying Living Expenses: Not hard at all  Food Insecurity: No Food Insecurity (02/07/2020)   Hunger Vital Sign    Worried About Running Out of Food in the Last Year: Never true    Ran Out of Food in the Last Year: Never true  Transportation Needs: No Transportation Needs (02/07/2020)   PRAPARE - Administrator, Civil Service (Medical): No    Lack of Transportation (Non-Medical): No  Physical Activity: Inactive (02/07/2020)   Exercise Vital Sign    Days of Exercise per Week: 0 days    Minutes of Exercise per Session: 0 min  Stress: No Stress Concern Present (02/07/2020)   [employer] of Occupational Health - Occupational Stress Questionnaire    Feeling of Stress : Not at  all  Social Connections: Not on file  Intimate Partner Violence: Not At Risk (02/07/2020)   Humiliation, Afraid, Rape, and Kick questionnaire    Fear of Current or Ex-Partner: No    Emotionally Abused: No    Physically Abused: No    Sexually Abused: No    Review of Systems: See HPI, otherwise negative ROS  Physical Exam: BP 112/82   Pulse 91   Temp 98.4 F (36.9 C) (Temporal)   Resp 16  Ht '5\' 5"'$  (1.651 m)   Wt 65.3 kg   SpO2 100%   BMI 23.96 kg/m  General:   Alert, cooperative in NAD Head:  Normocephalic and atraumatic. Respiratory:  Normal work of breathing. Cardiovascular:  RRR  Impression/Plan: Guy [name] is here for cataract surgery.  Risks, benefits, limitations, and alternatives regarding cataract surgery have been reviewed with the patient.  Questions have been answered.  All parties agreeable.   Birder Robson, MD  12/28/2021, 9:13 AM

## 2021-12-28 NOTE — Anesthesia Postprocedure Evaluation (Signed)
Anesthesia Post Note  Patient: Guy [name]  Procedure(s) Performed: CATARACT EXTRACTION PHACO AND INTRAOCULAR LENS PLACEMENT (IOC) RIGHT DIABETIC 6.34 00:50.8 (Right: Eye)     Patient location during evaluation: PACU Anesthesia Type: MAC Level of consciousness: awake and alert Pain management: pain level controlled Vital Signs Assessment: post-procedure vital signs reviewed and stable Respiratory status: spontaneous breathing, nonlabored ventilation, respiratory function stable and patient connected to nasal cannula oxygen Cardiovascular status: stable and blood pressure returned to baseline Postop Assessment: no apparent nausea or vomiting Anesthetic complications: no   No notable events documented.  Cortana Vanderford A  Derelle Cockrell

## 2021-12-29 ENCOUNTER — Encounter: Payer: Self-pay | Admitting: Ophthalmology

## 2021-12-30 ENCOUNTER — Encounter: Payer: Self-pay | Admitting: Ophthalmology

## 2022-01-03 DIAGNOSIS — H2512 Age-related nuclear cataract, left eye: Secondary | ICD-10-CM | POA: Diagnosis not present

## 2022-01-06 DIAGNOSIS — H353211 Exudative age-related macular degeneration, right eye, with active choroidal neovascularization: Secondary | ICD-10-CM | POA: Diagnosis not present

## 2022-01-06 NOTE — Discharge Instructions (Signed)

## 2022-01-11 ENCOUNTER — Other Ambulatory Visit: Payer: Self-pay

## 2022-01-11 ENCOUNTER — Ambulatory Visit
Admission: RE | Admit: 2022-01-11 | Discharge: 2022-01-11 | Disposition: A | Discharge status: Home / Self Care | Payer: Medicare Other | Attending: Ophthalmology | Admitting: Ophthalmology

## 2022-01-11 ENCOUNTER — Ambulatory Visit: Payer: Medicare Other | Admitting: Anesthesiology

## 2022-01-11 ENCOUNTER — Encounter: Payer: Self-pay | Admitting: Ophthalmology

## 2022-01-11 ENCOUNTER — Encounter
Admission: RE | Disposition: A | Discharge status: Home / Self Care | Payer: Self-pay | Source: Home / Self Care | Attending: Ophthalmology

## 2022-01-11 DIAGNOSIS — J45909 Unspecified asthma, uncomplicated: Secondary | ICD-10-CM | POA: Diagnosis not present

## 2022-01-11 DIAGNOSIS — K219 Gastro-esophageal reflux disease without esophagitis: Secondary | ICD-10-CM | POA: Diagnosis not present

## 2022-01-11 DIAGNOSIS — E1122 Type 2 diabetes mellitus with diabetic chronic kidney disease: Secondary | ICD-10-CM | POA: Insufficient documentation

## 2022-01-11 DIAGNOSIS — N189 Chronic kidney disease, unspecified: Secondary | ICD-10-CM | POA: Insufficient documentation

## 2022-01-11 DIAGNOSIS — E119 Type 2 diabetes mellitus without complications: Secondary | ICD-10-CM | POA: Diagnosis not present

## 2022-01-11 DIAGNOSIS — H2512 Age-related nuclear cataract, left eye: Secondary | ICD-10-CM | POA: Diagnosis not present

## 2022-01-11 DIAGNOSIS — E1136 Type 2 diabetes mellitus with diabetic cataract: Secondary | ICD-10-CM | POA: Insufficient documentation

## 2022-01-11 DIAGNOSIS — I129 Hypertensive chronic kidney disease with stage 1 through stage 4 chronic kidney disease, or unspecified chronic kidney disease: Secondary | ICD-10-CM | POA: Insufficient documentation

## 2022-01-11 DIAGNOSIS — K449 Diaphragmatic hernia without obstruction or gangrene: Secondary | ICD-10-CM | POA: Diagnosis not present

## 2022-01-11 DIAGNOSIS — C4491 Basal cell carcinoma of skin, unspecified: Secondary | ICD-10-CM | POA: Diagnosis not present

## 2022-01-11 HISTORY — PX: CATARACT EXTRACTION W/PHACO: SHX586

## 2022-01-11 SURGERY — PHACOEMULSIFICATION, CATARACT, WITH IOL INSERTION
Anesthesia: Monitor Anesthesia Care | Site: Eye | Laterality: Left

## 2022-01-11 MED ORDER — ACETAMINOPHEN 325 MG PO TABS: 325.0000 mg | ORAL_TABLET | ORAL | Status: DC | PRN

## 2022-01-11 MED ORDER — SIGHTPATH DOSE#1 BSS IO SOLN
INTRAOCULAR | Status: DC | PRN
  Administered 2022-01-11: 62 mL via OPHTHALMIC

## 2022-01-11 MED ORDER — MOXIFLOXACIN HCL 0.5 % OP SOLN
OPHTHALMIC | Status: DC | PRN
  Administered 2022-01-11: 0.2 mL via OPHTHALMIC

## 2022-01-11 MED ORDER — ACETAMINOPHEN 160 MG/5ML PO SOLN: 325.0000 mg | ORAL | Status: DC | PRN

## 2022-01-11 MED ORDER — SIGHTPATH DOSE#1 BSS IO SOLN: INTRAOCULAR | Status: DC | PRN

## 2022-01-11 MED ORDER — SIGHTPATH DOSE#1 BSS IO SOLN
INTRAOCULAR | Status: DC | PRN
  Administered 2022-01-11: 15 mL

## 2022-01-11 MED ORDER — MIDAZOLAM HCL 2 MG/2ML IJ SOLN
INTRAMUSCULAR | Status: DC | PRN
  Administered 2022-01-11 (×2): 1 mg via INTRAVENOUS

## 2022-01-11 MED ORDER — BRIMONIDINE TARTRATE-TIMOLOL 0.2-0.5 % OP SOLN
OPHTHALMIC | Status: DC | PRN
  Administered 2022-01-11: 1 [drp] via OPHTHALMIC

## 2022-01-11 MED ORDER — SIGHTPATH DOSE#1 NA CHONDROIT SULF-NA HYALURON 40-17 MG/ML IO SOLN
INTRAOCULAR | Status: DC | PRN
  Administered 2022-01-11: 1 mL via INTRAOCULAR

## 2022-01-11 MED ORDER — TETRACAINE HCL 0.5 % OP SOLN
1.0000 [drp] | OPHTHALMIC | Status: DC | PRN
  Administered 2022-01-11 (×3): 1 [drp] via OPHTHALMIC

## 2022-01-11 MED ORDER — ONDANSETRON HCL 4 MG/2ML IJ SOLN: 4.0000 mg | Freq: Once | INTRAMUSCULAR | Status: DC | PRN

## 2022-01-11 MED ORDER — FENTANYL CITRATE (PF) 100 MCG/2ML IJ SOLN
INTRAMUSCULAR | Status: DC | PRN
  Administered 2022-01-11 (×2): 50 ug via INTRAVENOUS

## 2022-01-11 MED ORDER — ARMC OPHTHALMIC DILATING DROPS
1.0000 | OPHTHALMIC | Status: DC | PRN
  Administered 2022-01-11 (×3): 1 via OPHTHALMIC

## 2022-01-11 SURGICAL SUPPLY — 10 items
CATARACT SUITE SIGHTPATH (MISCELLANEOUS) ×2 IMPLANT
FEE CATARACT SUITE SIGHTPATH (MISCELLANEOUS) ×1 IMPLANT
GLOVE SURG ENC TEXT LTX SZ8 (GLOVE) ×2 IMPLANT
GLOVE SURG TRIUMPH 8.0 PF LTX (GLOVE) ×2 IMPLANT
LENS IOL TECNIS EYHANCE 21.0 (Intraocular Lens) ×1 IMPLANT
NDL FILTER BLUNT 18X1 1/2 (NEEDLE) ×1 IMPLANT
NEEDLE FILTER BLUNT 18X 1/2SAF (NEEDLE) ×1
NEEDLE FILTER BLUNT 18X1 1/2 (NEEDLE) ×1 IMPLANT
SYR 3ML LL SCALE MARK (SYRINGE) ×2 IMPLANT
WATER STERILE IRR 250ML POUR (IV SOLUTION) ×2 IMPLANT

## 2022-01-11 NOTE — Op Note (Signed)
PREOPERATIVE DIAGNOSIS:  Nuclear sclerotic cataract of the left eye.   POSTOPERATIVE DIAGNOSIS:  Nuclear sclerotic cataract of the left eye.   OPERATIVE PROCEDURE:ORPROCALL@   SURGEON:  Birder Robson, MD.   ANESTHESIA:  Anesthesiologist: Veda Canning, MD CRNA: Izetta Dakin, CRNA  1.      Managed anesthesia care. 2.     0.25m of Shugarcaine was instilled following the paracentesis   COMPLICATIONS:  None.   TECHNIQUE:   Stop and chop   DESCRIPTION OF PROCEDURE:  The patient was examined and consented in the preoperative holding area where the aforementioned topical anesthesia was applied to the left eye and then brought back to the Operating Room where the left eye was prepped and draped in the usual sterile ophthalmic fashion and a lid speculum was placed. A paracentesis was created with the side port blade and the anterior chamber was filled with viscoelastic. A near clear corneal incision was performed with the steel keratome. A continuous curvilinear capsulorrhexis was performed with a cystotome followed by the capsulorrhexis forceps. Hydrodissection and hydrodelineation were carried out with BSS on a blunt cannula. The lens was removed in a stop and chop  technique and the remaining cortical material was removed with the irrigation-aspiration handpiece. The capsular bag was inflated with viscoelastic and the Technis [id number] lens was placed in the capsular bag without complication. The remaining viscoelastic was removed from the eye with the irrigation-aspiration handpiece. The wounds were hydrated. The anterior chamber was flushed with BSS and the eye was inflated to physiologic pressure. 0.1103mVigamox was placed in the anterior chamber. The wounds were found to be water tight. The eye was dressed with Combigan. The patient was given protective glasses to wear throughout the day and a shield with which to sleep tonight. The patient was also given drops with which to begin a drop regimen  today and will follow-up with me in one day. Implant Name Type Inv. Item Serial No. Manufacturer Lot No. LRB No. Used Action  LENS IOL TECNIS EYHANCE 21.0 - [id number] Lens LENS IOL TECNIS EYHANCE 21.0 [id number]  Left 1 Implanted    Procedure(s): CATARACT EXTRACTION PHACO AND INTRAOCULAR LENS PLACEMENT (IOC) LEFT DIABETIC 7.19 00:47.6 (Left)  Electronically signed: WiBirder Robson/25/2023 8:19 AM

## 2022-01-11 NOTE — H&P (Signed)
[facility]   Primary Care Physician:  Tonia Ghent, MD Ophthalmologist: Dr.Amyri Frenz  Pre-Procedure History & Physical: HPI:  Guy [name] is a 76 y.o. male here for cataract surgery.   Past Medical History:  Diagnosis Date   Actinic keratosis    Anxiety    not clinical   Arthritis    Basal cell carcinoma    Right nasal ala, right temple, nasal dorsum   Cataract    Chronic kidney disease    Stage 3   Diabetes mellitus type II    Diet controlled   Difficult intubation    Noted 02/16/18. Pt unaware of any issue - 12/16/21   Elevated PSA    GERD (gastroesophageal reflux disease)    History of basal cell carcinoma excision    1990's--  moh's surg. of nose   History of hiatal hernia    Neck complaint    "stiffness, catches"-denies numbness, tingling of arms   Prostate cancer ([place]) 2016    Past Surgical History:  Procedure Laterality Date   CATARACT EXTRACTION W/PHACO Right 12/28/2021   Procedure: CATARACT EXTRACTION PHACO AND INTRAOCULAR LENS PLACEMENT (IOC) RIGHT DIABETIC 6.34 00:50.8;  Surgeon: Birder Robson, MD;  Location: [facility];  Service: Ophthalmology;  Laterality: Right;   CHOLECYSTECTOMY N/A 02/16/2018   Procedure: LAPAROSCOPIC CHOLECYSTECTOMY;  Surgeon: Vickie Epley, MD;  Location: [facility];  Service: General;  Laterality: N/A;   COLONOSCOPY  06-01-2011   COLONOSCOPY WITH PROPOFOL N/A 12/06/2016   Procedure: COLONOSCOPY WITH PROPOFOL;  Surgeon: [name], MD;  Location: [facility];  Service: Endoscopy;  Laterality: N/A;   ESOPHAGOGASTRODUODENOSCOPY  07/29/05   [facility]   JOINT REPLACEMENT     LYMPHADENECTOMY N/A 05/06/2015   Procedure: LYMPH NODE DISSECTION;  Surgeon: Alexis Frock, MD;  Location: [facility];  Service: Urology;  Laterality: N/A;   MOHS SURGERY  1990's   nose   PROSTATE BIOPSY  2014   PROSTATE BIOPSY N/A 03/06/2015   Procedure: SATURATION BIOPSY TRANSRECTAL ULTRASONIC PROSTATE (TUBP);  Surgeon: Alexis Frock,  MD;  Location: [facility];  Service: Urology;  Laterality: N/A;   ROBOT ASSISTED LAPAROSCOPIC RADICAL PROSTATECTOMY N/A 05/06/2015   Procedure: ROBOTIC ASSISTED LAPAROSCOPIC RADICAL PROSTATECTOMY;  Surgeon: Alexis Frock, MD;  Location: [facility];  Service: Urology;  Laterality: N/A;   TONSILLECTOMY  as a child   TOTAL HIP ARTHROPLASTY Right 08/ 2000    Prior to Admission medications   Medication Sig Start Date End Date Taking? Authorizing Provider  Cholecalciferol (VITAMIN D3) 50 MCG (2000 UT) capsule Take 1 capsule (2,000 Units total) by mouth daily. 11/12/21  Yes Tonia Ghent, MD  famotidine (PEPCID) 10 MG tablet Take 10 mg by mouth daily.   Yes [provider]  losartan (COZAAR) 25 MG tablet Take 25 mg by mouth daily. 08/06/19 01/11/22 Yes [provider]  Multiple Vitamins-Minerals (PRESERVISION AREDS) TABS Take 1 tablet by mouth in the morning and at bedtime.   Yes [provider]  pravastatin (PRAVACHOL) 10 MG tablet Try taking 1 tab daily 08/12/21  Yes Tonia Ghent, MD  acetaminophen (TYLENOL) 500 MG tablet Take 1,000 mg by mouth 2 (two) times daily.     [provider]  Blood Glucose Monitoring Suppl (ONETOUCH VERIO) w/Device KIT 1 kit by Other route daily. Use as instructed to check blood sugar once daily and as needed.  Diagnosis:  E11.9  Non insulin dependent. 08/22/17   Tonia Ghent, MD  Continuous  Blood Gluc Sensor (FREESTYLE LIBRE 3 SENSOR) MISC Place 1 sensor on the skin every 14 days. Use to check glucose continuously. DX E11.9 09/23/21   Duncan, Graham S, MD  diclofenac sodium (VOLTAREN) 1 % GEL Apply topically 4 (four) times daily as needed.    [provider]  glucose blood (ONETOUCH VERIO) test strip CHECK BLOOD GLUCOSE ONCE DAILY AS DIRECTED. Dx E11.9. 11/01/21   Duncan, Graham S, MD  Lancets (ONETOUCH DELICA PLUS LANCET33G) MISC USE TO CHECK BLOOD SUGAR DAILY. Dx E11.9. 11/01/21   Duncan, Graham S, MD     Allergies as of 10/20/2021 - Review Complete 08/12/2021  Allergen Reaction Noted   Melatonin  09/07/2020   Nsaids Other (See Comments) 01/09/2018   Pravastatin Other (See Comments) 07/20/2016    Family History  Problem Relation Age of Onset   COPD Mother        smoker   Cancer Mother        unknown primary   Alzheimer's disease Father    Hypertension Father    Diabetes Father    Obesity Brother    Hypertension Brother    Diabetes Brother    Heart disease Brother        heart valve   Prostate cancer Neg Hx    Colon cancer Neg Hx     Social History   Socioeconomic History   Marital status: Widowed    Spouse name: Not on file   Number of children: 1   Years of education: Not on file   Highest education level: Not on file  Occupational History   Occupation: Retired 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)   Married 1980, widowed 2014   1 step daughter    [employer] fan   Retired from [employer]   Social Determinants of Health   Financial Resource Strain: Low Risk  (02/07/2020)   Overall Financial Resource Strain (CARDIA)    Difficulty of Paying Living Expenses: Not hard at all  Food Insecurity: No Food Insecurity (02/07/2020)   Hunger Vital Sign    Worried About Running Out of Food in the Last Year: Never true    Ran Out of Food in the Last Year: Never true  Transportation Needs: No Transportation Needs (02/07/2020)   PRAPARE - Transportation    Lack of Transportation (Medical): No    Lack of Transportation (Non-Medical): No  Physical Activity: Inactive  (02/07/2020)   Exercise Vital Sign    Days of Exercise per Week: 0 days    Minutes of Exercise per Session: 0 min  Stress: No Stress Concern Present (02/07/2020)   [employer] - Occupational Stress Questionnaire    Feeling of Stress : Not at all  Social Connections: Not on file  Intimate Partner Violence: Not At Risk (02/07/2020)   Humiliation, Afraid, Rape, and Kick questionnaire    Fear of Current or Ex-Partner: No    Emotionally Abused: No    Physically Abused: No      Sexually Abused: No    Review of Systems: See HPI, otherwise negative ROS  Physical Exam: BP 130/62   Pulse 66   Temp 97.9 F (36.6 C) (Temporal)   Ht 5' 5" (1.651 m)   Wt 67.2 kg   SpO2 100%   BMI 24.66 kg/m  General:   Alert, cooperative in NAD Head:  Normocephalic and atraumatic. Respiratory:  Normal work of breathing. Cardiovascular:  RRR  Impression/Plan: Guy [name] is here for cataract surgery.  Risks, benefits, limitations, and alternatives regarding cataract surgery have been reviewed with the patient.  Questions have been answered.  All parties agreeable.   Birder Robson, MD  01/11/2022, 7:53 AM

## 2022-01-11 NOTE — Transfer of Care (Signed)
Immediate Anesthesia Transfer of Care Note  Patient: Guy [name]  Procedure(s) Performed: CATARACT EXTRACTION PHACO AND INTRAOCULAR LENS PLACEMENT (IOC) LEFT DIABETIC 7.19 00:47.6 (Left: Eye)  Patient Location: PACU  Anesthesia Type: MAC  Level of Consciousness: awake, alert  and patient cooperative  Airway and Oxygen Therapy: Patient Spontanous Breathing and Patient connected to supplemental oxygen  Post-op Assessment: Post-op Vital signs reviewed, Patient's Cardiovascular Status Stable, Respiratory Function Stable, Patent Airway and No signs of Nausea or vomiting  Post-op Vital Signs: Reviewed and stable  Complications: No notable events documented.

## 2022-01-11 NOTE — Anesthesia Postprocedure Evaluation (Signed)
Anesthesia Post Note  Patient: Guy [name]  Procedure(s) Performed: CATARACT EXTRACTION PHACO AND INTRAOCULAR LENS PLACEMENT (IOC) LEFT DIABETIC 7.19 00:47.6 (Left: Eye)     Patient location during evaluation: PACU Anesthesia Type: MAC Level of consciousness: awake Pain management: pain level controlled Vital Signs Assessment: post-procedure vital signs reviewed and stable Respiratory status: respiratory function stable Cardiovascular status: stable Postop Assessment: no apparent nausea or vomiting Anesthetic complications: no   No notable events documented.  Veda Canning

## 2022-01-11 NOTE — Anesthesia Preprocedure Evaluation (Signed)
Anesthesia Evaluation  Patient identified by MRN, date of birth, ID band Patient awake    Reviewed: Allergy & Precautions, NPO status   History of Anesthesia Complications (+) DIFFICULT AIRWAY and history of anesthetic complications  Airway Mallampati: II  TM Distance: >3 FB Neck ROM: Limited    Dental   Pulmonary    breath sounds clear to auscultation       Cardiovascular hypertension,  Rhythm:Regular Rate:Normal   HLD   Neuro/Psych Anxiety    GI/Hepatic hiatal hernia, GERD  ,  Endo/Other  diabetes, Type 2  Renal/GU CRFRenal disease     Musculoskeletal  (+) Arthritis ,   Abdominal   Peds  Hematology   Anesthesia Other Findings Basal cell carcinoma Prostate cancer  Reproductive/Obstetrics                             Anesthesia Physical  Anesthesia Plan  ASA: 2  Anesthesia Plan: MAC   Post-op Pain Management:    Induction: Intravenous  PONV Risk Score and Plan: 1 and TIVA, Midazolam and Treatment may vary due to age or medical condition  Airway Management Planned: Nasal Cannula  Additional Equipment:   Intra-op Plan:   Post-operative Plan:   Informed Consent: I have reviewed the patients History and Physical, chart, labs and discussed the procedure including the risks, benefits and alternatives for the proposed anesthesia with the patient or authorized representative who has indicated his/her understanding and acceptance.       Plan Discussed with: CRNA and Anesthesiologist  Anesthesia Plan Comments:         Anesthesia Quick Evaluation

## 2022-01-12 ENCOUNTER — Encounter: Payer: Self-pay | Admitting: Ophthalmology

## 2022-02-01 ENCOUNTER — Encounter: Payer: Self-pay | Admitting: Family Medicine

## 2022-02-01 ENCOUNTER — Ambulatory Visit (INDEPENDENT_AMBULATORY_CARE_PROVIDER_SITE_OTHER): Payer: Medicare Other | Admitting: Family Medicine

## 2022-02-01 VITALS — BP 120/62 | HR 99 | Temp 98.2°F | Ht 65.0 in | Wt 149.0 lb

## 2022-02-01 DIAGNOSIS — E119 Type 2 diabetes mellitus without complications: Secondary | ICD-10-CM | POA: Diagnosis not present

## 2022-02-01 DIAGNOSIS — N189 Chronic kidney disease, unspecified: Secondary | ICD-10-CM

## 2022-02-01 DIAGNOSIS — Z125 Encounter for screening for malignant neoplasm of prostate: Secondary | ICD-10-CM | POA: Diagnosis not present

## 2022-02-01 DIAGNOSIS — E785 Hyperlipidemia, unspecified: Secondary | ICD-10-CM | POA: Diagnosis not present

## 2022-02-01 DIAGNOSIS — Z7189 Other specified counseling: Secondary | ICD-10-CM

## 2022-02-01 DIAGNOSIS — Z0000 Encounter for general adult medical examination without abnormal findings: Secondary | ICD-10-CM

## 2022-02-01 DIAGNOSIS — M26629 Arthralgia of temporomandibular joint, unspecified side: Secondary | ICD-10-CM | POA: Diagnosis not present

## 2022-02-01 DIAGNOSIS — R55 Syncope and collapse: Secondary | ICD-10-CM | POA: Diagnosis not present

## 2022-02-01 LAB — LIPID PANEL
Cholesterol: 173 mg/dL (ref 0–200)
HDL: 65.2 mg/dL (ref >39.00)
LDL Cholesterol: 95 mg/dL (ref 0–99)
NonHDL: 107.45
Total CHOL/HDL Ratio: 3
Triglycerides: 62 mg/dL (ref 0.0–149.0)
VLDL: 12.4 mg/dL (ref 0.0–40.0)

## 2022-02-01 LAB — COMPREHENSIVE METABOLIC PANEL
ALT: 12 U/L (ref 0–53)
AST: 15 U/L (ref 0–37)
Albumin: 4.7 g/dL (ref 3.5–5.2)
Alkaline Phosphatase: 75 U/L (ref 39–117)
BUN: 47 mg/dL — ABNORMAL HIGH (ref 6–23)
CO2: 26 mEq/L (ref 19–32)
Calcium: 9.8 mg/dL (ref 8.4–10.5)
Chloride: 102 mEq/L (ref 96–112)
Creatinine, Ser: 2.02 mg/dL — ABNORMAL HIGH (ref 0.40–1.50)
GFR: 31.6 mL/min — ABNORMAL LOW (ref >60.00)
Glucose, Bld: 146 mg/dL — ABNORMAL HIGH (ref 70–99)
Potassium: 4.5 mEq/L (ref 3.5–5.1)
Sodium: 139 mEq/L (ref 135–145)
Total Bilirubin: 0.6 mg/dL (ref 0.2–1.2)
Total Protein: 6.9 g/dL (ref 6.0–8.3)

## 2022-02-01 LAB — CBC WITH DIFFERENTIAL/PLATELET
Basophils Absolute: 0.1 10*3/uL (ref 0.0–0.1)
Basophils Relative: 1 % (ref 0.0–3.0)
Eosinophils Absolute: 0.1 10*3/uL (ref 0.0–0.7)
Eosinophils Relative: 1.2 % (ref 0.0–5.0)
HCT: 43.9 % (ref 39.0–52.0)
Hemoglobin: 14.3 g/dL (ref 13.0–17.0)
Lymphocytes Relative: 19.2 % (ref 12.0–46.0)
Lymphs Abs: 1.3 10*3/uL (ref 0.7–4.0)
MCHC: 32.6 g/dL (ref 30.0–36.0)
MCV: 96.4 fl (ref 78.0–100.0)
Monocytes Absolute: 0.6 10*3/uL (ref 0.1–1.0)
Monocytes Relative: 8.8 % (ref 3.0–12.0)
Neutro Abs: 4.6 10*3/uL (ref 1.4–7.7)
Neutrophils Relative %: 69.8 % (ref 43.0–77.0)
Platelets: 276 10*3/uL (ref 150.0–400.0)
RBC: 4.55 Mil/uL (ref 4.22–5.81)
RDW: 14.5 % (ref 11.5–15.5)
WBC: 6.6 10*3/uL (ref 4.0–10.5)

## 2022-02-01 LAB — TSH: TSH: 1.64 u[IU]/mL (ref 0.35–5.50)

## 2022-02-01 LAB — PSA, MEDICARE: PSA: 0 ng/ml — ABNORMAL LOW (ref 0.10–4.00)

## 2022-02-01 LAB — HEMOGLOBIN A1C: Hgb A1c MFr Bld: 6.8 % — ABNORMAL HIGH (ref 4.6–6.5)

## 2022-02-01 MED ORDER — LOSARTAN POTASSIUM 25 MG PO TABS: 12.5000 mg | ORAL_TABLET | Freq: Every day | ORAL | Status: AC

## 2022-02-01 NOTE — Progress Notes (Unsigned)
I have personally reviewed the Medicare Annual Wellness questionnaire and have noted 1. The patient's medical and social history 2. Their use of alcohol, tobacco or illicit drugs 3. Their current medications and supplements 4. The patient's functional ability including ADL's, fall risks, home safety risks and hearing or visual             impairment. 5. Diet and physical activities 6. Evidence for depression or mood disorders  The patients weight, height, BMI have been recorded in the chart and visual acuity is per eye clinic.  I have made referrals, counseling and provided education to the patient based review of the above and I have provided the pt with a written personalized care plan for preventive services.  Provider list updated- see scanned forms.  Routine anticipatory guidance given to patient.  See health maintenance. The possibility exists that previously documented standard health maintenance information may have been brought forward from a previous encounter into this note.  If needed, that same information has been updated to reflect the current situation based on today's encounter.    Flu Shingles PNA Tetanus Colon  Breast cancer screening Prostate cancer screening Advance directive Cognitive function addressed- see scanned forms- and if abnormal then additional documentation follows.   In addition to [facility] Wellness, follow up visit for the below conditions:  His granddaughter will be applying to colleges this year.    R jaw pain.  Noted after eating a protein bar.  Pain at the time, crepitus with chewing.  Sx resolve in the meantime except for some crepitus noted by patient.    Fell last week.    Leg cramps.    CKD.  Per renal.    Diabetes:  No meds.   Hypoglycemic episodes: no Hyperglycemic episodes: no Feet problems: no Blood Sugars averaging: usually 120s in the AMs.  Max 180 after eating.   eye exam within last year: yes, vision improved with cataract  surgery and he has f/u pending.   Labs pending.    Elevated Cholesterol: Using medications without problems: yes Muscle aches: not from current dose of statin.   Diet compliance: yes Exercise:yes  PMH and SH reviewed  Meds, vitals, and allergies reviewed.   ROS: Per HPI.  Unless specifically indicated otherwise in HPI, the patient denies:  General: fever. Eyes: acute vision changes ENT: sore throat Cardiovascular: chest pain Respiratory: SOB GI: vomiting GU: dysuria Musculoskeletal: acute back pain Derm: acute rash Neuro: acute motor dysfunction Psych: worsening mood Endocrine: polydipsia Heme: bleeding Allergy: hayfever  GEN: nad, alert and oriented HEENT: mucous membranes moist, R TMJ with crepitus on ROM.   NECK: supple w/o LA CV: rrr. PULM: ctab, no inc wob ABD: soft, +bs EXT: no edema SKIN: no acute rash  Diabetic foot exam: Normal inspection No skin breakdown No calluses  Normal DP pulses Normal sensation to light touch and monofilament Nails normal

## 2022-02-01 NOTE — Patient Instructions (Addendum)
Take care.  Glad to see you. Go to the lab on the way out.   If you have mychart we'll likely use that to update you.    Call GI about follow up.   I would cut losartan in half in the meantime.   Plan on recheck in about 3 months.  Labs at the visit.  Ice your jaw for 5 minutes at a time and limit hard chewing.

## 2022-02-02 DIAGNOSIS — R55 Syncope and collapse: Secondary | ICD-10-CM | POA: Insufficient documentation

## 2022-02-02 DIAGNOSIS — M26629 Arthralgia of temporomandibular joint, unspecified side: Secondary | ICD-10-CM | POA: Insufficient documentation

## 2022-02-02 NOTE — Assessment & Plan Note (Signed)
Advance directive discussed with patient. Daughter designated if patient were incapacitated. 

## 2022-02-02 NOTE — Assessment & Plan Note (Signed)
Flu done yearly Shingles previously done PNA  Tetanus 2013 COVID-vaccine previously done Colonoscopy 2018.  Letter printed for patient he can call about follow-up.  Discussed. Prostate cancer screening pending. Advance directive discussed with patient.  Daughter designated if patient were incapacitated. Cognitive function addressed- see scanned forms- and if abnormal then additional documentation follows.

## 2022-02-02 NOTE — Assessment & Plan Note (Signed)
Discussed limiting heavy chewing and he can ice as needed.  Avoid NSAIDs.  Update me as needed.

## 2022-02-02 NOTE — Assessment & Plan Note (Signed)
Continue 10 mg of pravastatin.  He can tolerate that.

## 2022-02-02 NOTE — Assessment & Plan Note (Signed)
Single event, was lightheaded, after getting up from bed quickly.  He does not have claudication when he is up walking around.  I suspect he was mildly/relatively dehydrated at the time.  EKG is normal.  Would decrease losartan to 12.5 mg and check routine labs today.  At this point still okay for outpatient follow-up.

## 2022-02-02 NOTE — Assessment & Plan Note (Signed)
Per renal.  My concern is that he could be more likely to get lightheaded with 25 mg losartan and I asked him to decrease that to 12.5 mg.

## 2022-02-02 NOTE — Assessment & Plan Note (Signed)
Goal is to avoid hypoglycemia.  Blood sugar has been reasonable at home.  See notes on labs.

## 2022-02-03 DIAGNOSIS — H353211 Exudative age-related macular degeneration, right eye, with active choroidal neovascularization: Secondary | ICD-10-CM | POA: Diagnosis not present

## 2022-02-10 ENCOUNTER — Encounter: Payer: Self-pay | Admitting: Dermatology

## 2022-02-10 ENCOUNTER — Ambulatory Visit (INDEPENDENT_AMBULATORY_CARE_PROVIDER_SITE_OTHER): Payer: Medicare Other | Admitting: Dermatology

## 2022-02-10 DIAGNOSIS — Z85828 Personal history of other malignant neoplasm of skin: Secondary | ICD-10-CM

## 2022-02-10 DIAGNOSIS — D229 Melanocytic nevi, unspecified: Secondary | ICD-10-CM | POA: Diagnosis not present

## 2022-02-10 DIAGNOSIS — L814 Other melanin hyperpigmentation: Secondary | ICD-10-CM

## 2022-02-10 DIAGNOSIS — L578 Other skin changes due to chronic exposure to nonionizing radiation: Secondary | ICD-10-CM

## 2022-02-10 DIAGNOSIS — L570 Actinic keratosis: Secondary | ICD-10-CM

## 2022-02-10 DIAGNOSIS — L820 Inflamed seborrheic keratosis: Secondary | ICD-10-CM

## 2022-02-10 DIAGNOSIS — D1800 Hemangioma unspecified site: Secondary | ICD-10-CM

## 2022-02-10 DIAGNOSIS — Z1283 Encounter for screening for malignant neoplasm of skin: Secondary | ICD-10-CM

## 2022-02-10 DIAGNOSIS — L821 Other seborrheic keratosis: Secondary | ICD-10-CM

## 2022-02-10 NOTE — Patient Instructions (Addendum)
Cryotherapy Aftercare  Wash gently with soap and water everyday.   Apply Vaseline daily until healed.    Recommend taking Heliocare sun protection supplement daily in sunny weather for additional sun protection. For maximum protection on the sunniest days, you can take up to 2 capsules of regular Heliocare OR take 1 capsule of Heliocare Ultra. For prolonged exposure (such as a full day in the sun), you can repeat your dose of the supplement 4 hours after your first dose. Heliocare can be purchased at [facility], at some [facility] or at www.heliocare.com.     Recommend daily broad spectrum sunscreen SPF 30+ to sun-exposed areas, reapply every 2 hours as needed. Call for new or changing lesions.  Staying in the shade or wearing long sleeves, sun glasses (UVA+UVB protection) and wide brim hats (4-inch brim around the entire circumference of the hat) are also recommended for sun protection.      Melanoma ABCDEs  Melanoma is the most dangerous type of skin cancer, and is the leading cause of death from skin disease.  You are more likely to develop melanoma if you: Have light-colored skin, light-colored eyes, or red or blond hair Spend a lot of time in the sun Tan regularly, either outdoors or in a tanning bed Have had blistering sunburns, especially during childhood Have a close family member who has had a melanoma Have atypical moles or large birthmarks  Early detection of melanoma is key since treatment is typically straightforward and cure rates are extremely high if we catch it early.   The first sign of melanoma is often a change in a mole or a new dark spot.  The ABCDE system is a way of remembering the signs of melanoma.  A for asymmetry:  The two halves do not match. B for border:  The edges of the growth are irregular. C for color:  A mixture of colors are present instead of an even brown color. D for diameter:  Melanomas are usually (but not always) greater than 6mm -  the size of a pencil eraser. E for evolution:  The spot keeps changing in size, shape, and color.  Please check your skin once per month between visits. You can use a small mirror in front and a large mirror behind you to keep an eye on the back side or your body.   If you see any new or changing lesions before your next follow-up, please call to schedule a visit.  Please continue daily skin protection including broad spectrum sunscreen SPF 30+ to sun-exposed areas, reapplying every 2 hours as needed when you're outdoors.   Staying in the shade or wearing long sleeves, sun glasses (UVA+UVB protection) and wide brim hats (4-inch brim around the entire circumference of the hat) are also recommended for sun protection.     Due to recent changes in healthcare laws, you may see results of your pathology and/or laboratory studies on MyChart before the doctors have had a chance to review them. We understand that in some cases there may be results that are confusing or concerning to you. Please understand that not all results are received at the same time and often the doctors may need to interpret multiple results in order to provide you with the best plan of care or course of treatment. Therefore, we ask that you please give us 2 business days to thoroughly review all your results before contacting the office for clarification. Should we see a critical lab result, you will   be contacted sooner.   If You Need Anything After Your Visit  If you have any questions or concerns for your doctor, please call our main line at [phone] and press option 4 to reach your doctor's medical assistant. If no one answers, please leave a voicemail as directed and we will return your call as soon as possible. Messages left after 4 pm will be answered the following business day.   You may also send us a message via MyChart. We typically respond to MyChart messages within 1-2 business days.  For prescription refills,  please ask your pharmacy to contact our office. Our fax number is [phone].  If you have an urgent issue when the clinic is closed that cannot wait until the next business day, you can page your doctor at the number below.    Please note that while we do our best to be available for urgent issues outside of office hours, we are not available 24/7.   If you have an urgent issue and are unable to reach us, you may choose to seek medical care at your doctor's office, retail clinic, urgent care center, or emergency room.  If you have a medical emergency, please immediately call 911 or go to the emergency department.  Pager Numbers  - Dr. Kowalski: [phone]  - Dr. Moye: [phone]  - Dr. Stewart: [phone]  In the event of inclement weather, please call our main line at [phone] for an update on the status of any delays or closures.  Dermatology Medication Tips: Please keep the boxes that topical medications come in in order to help keep track of the instructions about where and how to use these. Pharmacies typically print the medication instructions only on the boxes and not directly on the medication tubes.   If your medication is too expensive, please contact our office at [phone] option 4 or send us a message through MyChart.   We are unable to tell what your co-pay for medications will be in advance as this is different depending on your insurance coverage. However, we may be able to find a substitute medication at lower cost or fill out paperwork to get insurance to cover a needed medication.   If a prior authorization is required to get your medication covered by your insurance company, please allow us 1-2 business days to complete this process.  Drug prices often vary depending on where the prescription is filled and some pharmacies may offer cheaper prices.  The website www.goodrx.com contains coupons for medications through different pharmacies. The prices  here do not account for what the cost may be with help from insurance (it may be cheaper with your insurance), but the website can give you the price if you did not use any insurance.  - You can print the associated coupon and take it with your prescription to the pharmacy.  - You may also stop by our office during regular business hours and pick up a GoodRx coupon card.  - If you need your prescription sent electronically to a different pharmacy, notify our office through [facility] MyChart or by phone at [phone] option 4.     Si Usted Necesita Algo Despus de Su Visita  Tambin puede enviarnos un mensaje a travs de MyChart. Por lo general respondemos a los mensajes de MyChart en el transcurso de 1 a 2 das hbiles.  Para renovar recetas, por favor pida a su farmacia que se ponga en contacto con nuestra oficina. Nuestro nmero de fax   es el [phone].  Si tiene un asunto urgente cuando la clnica est cerrada y que no puede esperar hasta el siguiente da hbil, puede llamar/localizar a su doctor(a) al nmero que aparece a continuacin.   Por favor, tenga en cuenta que aunque hacemos todo lo posible para estar disponibles para asuntos urgentes fuera del horario de oficina, no estamos disponibles las 24 horas del da, los 7 das de la semana.   Si tiene un problema urgente y no puede comunicarse con nosotros, puede optar por buscar atencin mdica  en el consultorio de su doctor(a), en una clnica privada, en un centro de atencin urgente o en una sala de emergencias.  Si tiene una emergencia mdica, por favor llame inmediatamente al 911 o vaya a la sala de emergencias.  Nmeros de bper  - Dr. Kowalski: [phone]  - Dra. Moye: [phone]  - Dra. Stewart: [phone]  En caso de inclemencias del tiempo, por favor llame a nuestra lnea principal al [phone] para una actualizacin sobre el estado de cualquier retraso o cierre.  Consejos para la medicacin en  dermatologa: Por favor, guarde las cajas en las que vienen los medicamentos de uso tpico para ayudarle a seguir las instrucciones sobre dnde y cmo usarlos. Las farmacias generalmente imprimen las instrucciones del medicamento slo en las cajas y no directamente en los tubos del medicamento.   Si su medicamento es muy caro, por favor, pngase en contacto con nuestra oficina llamando al [phone] y presione la opcin 4 o envenos un mensaje a travs de MyChart.   No podemos decirle cul ser su copago por los medicamentos por adelantado ya que esto es diferente dependiendo de la cobertura de su seguro. Sin embargo, es posible que podamos encontrar un medicamento sustituto a menor costo o llenar un formulario para que el seguro cubra el medicamento que se considera necesario.   Si se requiere una autorizacin previa para que su compaa de seguros cubra su medicamento, por favor permtanos de 1 a 2 das hbiles para completar este proceso.  Los precios de los medicamentos varan con frecuencia dependiendo del lugar de dnde se surte la receta y alguna farmacias pueden ofrecer precios ms baratos.  El sitio web www.goodrx.com tiene cupones para medicamentos de diferentes farmacias. Los precios aqu no tienen en cuenta lo que podra costar con la ayuda del seguro (puede ser ms barato con su seguro), pero el sitio web puede darle el precio si no utiliz ningn seguro.  - Puede imprimir el cupn correspondiente y llevarlo con su receta a la farmacia.  - Tambin puede pasar por nuestra oficina durante el horario de atencin regular y recoger una tarjeta de cupones de GoodRx.  - Si necesita que su receta se enve electrnicamente a una farmacia diferente, informe a nuestra oficina a travs de MyChart de [place] o por telfono llamando al [phone] y presione la opcin 4.  

## 2022-02-10 NOTE — Progress Notes (Signed)
Follow-Up Visit   Subjective  Guy [name] is a 76 y.o. male who presents for the following: Annual Exam (Hx BCC, AK's).  The patient presents for Total-Body Skin Exam (TBSE) for skin cancer screening and mole check.  The patient has spots, moles and lesions to be evaluated, some may be new or changing and the patient has concerns that these could be cancer.  The following portions of the chart were reviewed this encounter and updated as appropriate:  Tobacco  Allergies  Meds  Problems  Med Hx  Surg Hx  Fam Hx      Review of Systems: No other skin or systemic complaints except as noted in HPI or Assessment and Plan.   Objective  Well appearing patient in no apparent distress; mood and affect are within normal limits.  A full examination was performed including scalp, head, eyes, ears, nose, lips, neck, chest, axillae, abdomen, back, buttocks, bilateral upper extremities, bilateral lower extremities, hands, feet, fingers, toes, fingernails, and toenails. All findings within normal limits unless otherwise noted below.  Left Dorsal Hand  x1 Erythematous thin papules/macules with gritty scale.   left flank x1 Erythematous keratotic or waxy stuck-on papule or plaque.   Assessment & Plan   History of Basal Cell Carcinoma of the Skin. Right nasal ala, right temple, nasal dorsum - No evidence of recurrence today - Recommend regular full body skin exams - Recommend daily broad spectrum sunscreen SPF 30+ to sun-exposed areas, reapply every 2 hours as needed.  - Call if any new or changing lesions are noted between office visits  Lentigines - Scattered tan macules - Due to sun exposure - Benign-appearing, observe - Recommend daily broad spectrum sunscreen SPF 30+ to sun-exposed areas, reapply every 2 hours as needed. - Call for any changes  Seborrheic Keratoses - Stuck-on, waxy, tan-brown papules and/or plaques  - Benign-appearing - Discussed benign etiology and  prognosis. - Observe - Call for any changes  Melanocytic Nevi - Tan-brown and/or pink-flesh-colored symmetric macules and papules - Benign appearing on exam today - Observation - Call clinic for new or changing moles - Recommend daily use of broad spectrum spf 30+ sunscreen to sun-exposed areas.   Hemangiomas - Red papules - Discussed benign nature - Observe - Call for any changes  Actinic Damage - Chronic condition, secondary to cumulative UV/sun exposure - diffuse scaly erythematous macules with underlying dyspigmentation - Recommend daily broad spectrum sunscreen SPF 30+ to sun-exposed areas, reapply every 2 hours as needed.  - Staying in the shade or wearing long sleeves, sun glasses (UVA+UVB protection) and wide brim hats (4-inch brim around the entire circumference of the hat) are also recommended for sun protection.  - Call for new or changing lesions.  Skin cancer screening performed today.  AK (actinic keratosis) Left Dorsal Hand  x1  Actinic keratoses are precancerous spots that appear secondary to cumulative UV radiation exposure/sun exposure over time. They are chronic with expected duration over 1 year. A portion of actinic keratoses will progress to squamous cell carcinoma of the skin. It is not possible to reliably predict which spots will progress to skin cancer and so treatment is recommended to prevent development of skin cancer.  Recommend daily broad spectrum sunscreen SPF 30+ to sun-exposed areas, reapply every 2 hours as needed.  Recommend staying in the shade or wearing long sleeves, sun glasses (UVA+UVB protection) and wide brim hats (4-inch brim around the entire circumference of the hat). Call for new or changing lesions.  Destruction of lesion - Left Dorsal Hand  x1  Destruction method: cryotherapy   Informed consent: discussed and consent obtained   Lesion destroyed using liquid nitrogen: Yes   Outcome: patient tolerated procedure well with no  complications   Post-procedure details: wound care instructions given   Additional details:  Prior to procedure, discussed risks of blister formation, small wound, skin dyspigmentation, or rare scar following cryotherapy. Recommend Vaseline ointment to treated areas while healing.   Inflamed seborrheic keratosis left flank x1  Symptomatic, irritating, patient would like treated.  Destruction of lesion - left flank x1  Destruction method: cryotherapy   Informed consent: discussed and consent obtained   Lesion destroyed using liquid nitrogen: Yes   Outcome: patient tolerated procedure well with no complications   Post-procedure details: wound care instructions given   Additional details:  Prior to procedure, discussed risks of blister formation, small wound, skin dyspigmentation, or rare scar following cryotherapy. Recommend Vaseline ointment to treated areas while healing.    Return in about 1 year (around 02/11/2023) for TBSE, HxBCC's.  I, Emelia Salisbury, CMA, am acting as scribe for Forest Gleason, MD.  Documentation: I have reviewed the above documentation for accuracy and completeness, and I agree with the above.  Forest Gleason, MD

## 2022-02-14 ENCOUNTER — Encounter: Payer: Self-pay | Admitting: Dermatology

## 2022-03-02 DIAGNOSIS — E1122 Type 2 diabetes mellitus with diabetic chronic kidney disease: Secondary | ICD-10-CM | POA: Diagnosis not present

## 2022-03-02 DIAGNOSIS — I10 Essential (primary) hypertension: Secondary | ICD-10-CM | POA: Diagnosis not present

## 2022-03-02 DIAGNOSIS — N1832 Chronic kidney disease, stage 3b: Secondary | ICD-10-CM | POA: Diagnosis not present

## 2022-03-02 DIAGNOSIS — N2581 Secondary hyperparathyroidism of renal origin: Secondary | ICD-10-CM | POA: Diagnosis not present

## 2022-03-02 LAB — MICROALBUMIN, URINE: Microalb, Ur: 0.2

## 2022-03-02 LAB — MICROALBUMIN / CREATININE URINE RATIO: Microalb Creat Ratio: NEGATIVE

## 2022-03-07 ENCOUNTER — Telehealth: Payer: Self-pay

## 2022-03-07 ENCOUNTER — Other Ambulatory Visit: Payer: Self-pay

## 2022-03-07 DIAGNOSIS — Z86010 Personal history of colonic polyps: Secondary | ICD-10-CM

## 2022-03-07 DIAGNOSIS — I10 Essential (primary) hypertension: Secondary | ICD-10-CM | POA: Diagnosis not present

## 2022-03-07 DIAGNOSIS — H353211 Exudative age-related macular degeneration, right eye, with active choroidal neovascularization: Secondary | ICD-10-CM | POA: Diagnosis not present

## 2022-03-07 DIAGNOSIS — N1832 Chronic kidney disease, stage 3b: Secondary | ICD-10-CM | POA: Diagnosis not present

## 2022-03-07 DIAGNOSIS — N2581 Secondary hyperparathyroidism of renal origin: Secondary | ICD-10-CM | POA: Diagnosis not present

## 2022-03-07 DIAGNOSIS — I129 Hypertensive chronic kidney disease with stage 1 through stage 4 chronic kidney disease, or unspecified chronic kidney disease: Secondary | ICD-10-CM | POA: Diagnosis not present

## 2022-03-07 MED ORDER — GOLYTELY 236 G PO SOLR: 4000.0000 mL | Freq: Once | ORAL | 0 refills | Status: AC

## 2022-03-07 NOTE — Telephone Encounter (Signed)
Gastroenterology Pre-Procedure Review  Request Date: 04/14/22 Requesting Physician: Dr. Wohl  PATIENT REVIEW QUESTIONS: The patient responded to the following health history questions as indicated:    Colonoscopy prep Golytely used due to patient has CKD  1. Are you having any GI issues? no 2. Do you have a personal history of Polyps? yes (2018 colonoscopy performed by Dr. Wohl noted polyps) 3. Do you have a family history of Colon Cancer or Polyps? yes (brother and father colon polyps) 4. Diabetes Mellitus? no 5. Joint replacements in the past 12 months?no 6. Major health problems in the past 3 months?yes (cataract surgery X2 in July 2023) 7. Any artificial heart valves, MVP, or defibrillator?no    MEDICATIONS & ALLERGIES:    Patient reports the following regarding taking any anticoagulation/antiplatelet therapy:   Plavix, Coumadin, Eliquis, Xarelto, Lovenox, Pradaxa, Brilinta, or Effient? no Aspirin? no  Patient confirms/reports the following medications:  Current Outpatient Medications  Medication Sig Dispense Refill   acetaminophen (TYLENOL) 500 MG tablet Take 1,000 mg by mouth 2 (two) times daily.      Blood Glucose Monitoring Suppl (ONETOUCH VERIO) w/Device KIT 1 kit by Other route daily. Use as instructed to check blood sugar once daily and as needed.  Diagnosis:  E11.9  Non insulin dependent. 1 kit 0   Cholecalciferol (VITAMIN D3) 50 MCG (2000 UT) capsule Take 1 capsule (2,000 Units total) by mouth daily.     diclofenac sodium (VOLTAREN) 1 % GEL Apply topically 4 (four) times daily as needed.     famotidine (PEPCID) 10 MG tablet Take 10 mg by mouth daily.     glucose blood (ONETOUCH VERIO) test strip CHECK BLOOD GLUCOSE ONCE DAILY AS DIRECTED. Dx E11.9. 100 strip 3   Lancets (ONETOUCH DELICA PLUS LANCET33G) MISC USE TO CHECK BLOOD SUGAR DAILY. Dx E11.9. 100 each 3   losartan (COZAAR) 25 MG tablet Take 0.5 tablets (12.5 mg total) by mouth daily.     Multiple Vitamins-Minerals  (PRESERVISION AREDS) TABS Take 1 tablet by mouth in the morning and at bedtime.     pravastatin (PRAVACHOL) 10 MG tablet Try taking 1 tab daily     No current facility-administered medications for this visit.    Patient confirms/reports the following allergies:  Allergies  Allergen Reactions   Melatonin     GI upset at higher dose   Nsaids Other (See Comments)    Avoid due to h/o renal disease   Pravastatin Other (See Comments)    Aches at 40mg dosing.     No orders of the defined types were placed in this encounter.   AUTHORIZATION INFORMATION Primary Insurance: 1D#: Group #:  Secondary Insurance: 1D#: Group #:  SCHEDULE INFORMATION: Date:  Time: Location:  

## 2022-03-15 ENCOUNTER — Telehealth: Payer: Self-pay | Admitting: Family Medicine

## 2022-03-15 MED ORDER — ONETOUCH DELICA PLUS LANCET33G MISC: 3 refills | Status: DC

## 2022-03-15 MED ORDER — ONETOUCH VERIO VI STRP: ORAL_STRIP | 3 refills | Status: DC

## 2022-03-15 NOTE — Telephone Encounter (Signed)
Pt called stating he needed a refill on his test strips for Blood Glucose Monitoring Suppl (ONETOUCH VERIO) w/Device KIT. He stated he filled an order online with his pharmacy & was told they were ready for pick up online but the pharmacy stated he couldn't get a refill until November 2023. Pt wanted to know could he get the test strips refilled? Pt is out of strips. Call back # is [phone]

## 2022-03-15 NOTE — Addendum Note (Signed)
Addended by: Sherrilee Gilles B on: 03/15/2022 12:16 PM   Modules accepted: Orders

## 2022-03-15 NOTE — Telephone Encounter (Signed)
Pt called back asking if Lancets (ONETOUCH DELICA PLUS [id number]) [place] would be refilled also along with the test strips? Call back # [phone].

## 2022-03-15 NOTE — Telephone Encounter (Signed)
Spoke with patient and advised I will send in new rx. Patient is checking his BS several times a day and I have updated directions on rx with this.

## 2022-03-15 NOTE — Telephone Encounter (Signed)
Erx sent for lancets

## 2022-04-06 DIAGNOSIS — H353211 Exudative age-related macular degeneration, right eye, with active choroidal neovascularization: Secondary | ICD-10-CM | POA: Diagnosis not present

## 2022-04-14 ENCOUNTER — Ambulatory Visit: Payer: Medicare Other | Admitting: Anesthesiology

## 2022-04-14 ENCOUNTER — Encounter
Admission: RE | Disposition: A | Discharge status: Home / Self Care | Payer: Self-pay | Source: Home / Self Care | Attending: Gastroenterology

## 2022-04-14 ENCOUNTER — Ambulatory Visit
Admission: RE | Admit: 2022-04-14 | Discharge: 2022-04-14 | Disposition: A | Discharge status: Home / Self Care | Payer: Medicare Other | Attending: Gastroenterology | Admitting: Gastroenterology

## 2022-04-14 ENCOUNTER — Encounter: Payer: Self-pay | Admitting: Gastroenterology

## 2022-04-14 DIAGNOSIS — I129 Hypertensive chronic kidney disease with stage 1 through stage 4 chronic kidney disease, or unspecified chronic kidney disease: Secondary | ICD-10-CM | POA: Insufficient documentation

## 2022-04-14 DIAGNOSIS — N183 Chronic kidney disease, stage 3 unspecified: Secondary | ICD-10-CM | POA: Diagnosis not present

## 2022-04-14 DIAGNOSIS — D126 Benign neoplasm of colon, unspecified: Secondary | ICD-10-CM | POA: Diagnosis not present

## 2022-04-14 DIAGNOSIS — T184XXA Foreign body in colon, initial encounter: Secondary | ICD-10-CM | POA: Diagnosis not present

## 2022-04-14 DIAGNOSIS — K635 Polyp of colon: Secondary | ICD-10-CM | POA: Diagnosis not present

## 2022-04-14 DIAGNOSIS — K449 Diaphragmatic hernia without obstruction or gangrene: Secondary | ICD-10-CM | POA: Diagnosis not present

## 2022-04-14 DIAGNOSIS — Z8249 Family history of ischemic heart disease and other diseases of the circulatory system: Secondary | ICD-10-CM | POA: Diagnosis not present

## 2022-04-14 DIAGNOSIS — Z833 Family history of diabetes mellitus: Secondary | ICD-10-CM | POA: Insufficient documentation

## 2022-04-14 DIAGNOSIS — X58XXXA Exposure to other specified factors, initial encounter: Secondary | ICD-10-CM | POA: Insufficient documentation

## 2022-04-14 DIAGNOSIS — Z1211 Encounter for screening for malignant neoplasm of colon: Secondary | ICD-10-CM | POA: Insufficient documentation

## 2022-04-14 DIAGNOSIS — Z86010 Personal history of colonic polyps: Secondary | ICD-10-CM | POA: Diagnosis not present

## 2022-04-14 DIAGNOSIS — K219 Gastro-esophageal reflux disease without esophagitis: Secondary | ICD-10-CM | POA: Diagnosis not present

## 2022-04-14 DIAGNOSIS — E1122 Type 2 diabetes mellitus with diabetic chronic kidney disease: Secondary | ICD-10-CM | POA: Diagnosis not present

## 2022-04-14 DIAGNOSIS — N1832 Chronic kidney disease, stage 3b: Secondary | ICD-10-CM | POA: Diagnosis not present

## 2022-04-14 HISTORY — PX: COLONOSCOPY WITH PROPOFOL: SHX5780

## 2022-04-14 SURGERY — COLONOSCOPY WITH PROPOFOL
Anesthesia: General

## 2022-04-14 MED ORDER — SODIUM CHLORIDE 0.9 % IV SOLN: INTRAVENOUS | Status: DC

## 2022-04-14 MED ORDER — PROPOFOL 10 MG/ML IV BOLUS
INTRAVENOUS | Status: DC | PRN
  Administered 2022-04-14: 100 mg via INTRAVENOUS
  Administered 2022-04-14: 160 ug/kg/min via INTRAVENOUS

## 2022-04-14 MED ORDER — LIDOCAINE HCL (CARDIAC) PF 100 MG/5ML IV SOSY
PREFILLED_SYRINGE | INTRAVENOUS | Status: DC | PRN
  Administered 2022-04-14: 100 mg via INTRAVENOUS

## 2022-04-14 NOTE — Anesthesia Preprocedure Evaluation (Signed)
Anesthesia Evaluation  Patient identified by MRN, date of birth, ID band Patient awake    Reviewed: Allergy & Precautions, H&P , NPO status , Patient's Chart, lab work & pertinent test results, reviewed documented beta blocker date and time   Airway Mallampati: II   Neck ROM: full    Dental  (+) Poor Dentition   Pulmonary neg pulmonary ROS,    Pulmonary exam normal        Cardiovascular Exercise Tolerance: Good hypertension, Normal cardiovascular exam Rhythm:regular Rate:Normal     Neuro/Psych Anxiety  Neuromuscular disease negative psych ROS   GI/Hepatic Neg liver ROS, hiatal hernia, GERD  Medicated,  Endo/Other  negative endocrine ROSdiabetes  Renal/GU negative Renal ROS  negative genitourinary   Musculoskeletal   Abdominal   Peds  Hematology negative hematology ROS (+)   Anesthesia Other Findings Past Medical History: No date: Actinic keratosis No date: Anxiety     Comment:  not clinical No date: Arthritis No date: Basal cell carcinoma     Comment:  Right nasal ala, right temple, nasal dorsum No date: Cataract No date: Chronic kidney disease     Comment:  Stage 3 No date: Diabetes mellitus type II     Comment:  Diet controlled No date: Difficult intubation     Comment:  Noted 02/16/18. Pt unaware of any issue - 12/16/21 No date: Elevated PSA No date: GERD (gastroesophageal reflux disease) No date: History of basal cell carcinoma excision     Comment:  1990's--  moh's surg. of nose No date: History of hiatal hernia No date: Neck complaint     Comment:  "stiffness, catches"-denies numbness, tingling of arms 2016: Prostate cancer [facility]) Past Surgical History: 12/28/2021: CATARACT EXTRACTION W/PHACO; Right     Comment:  Procedure: CATARACT EXTRACTION PHACO AND INTRAOCULAR               LENS PLACEMENT (IOC) RIGHT DIABETIC 6.34 00:50.8;                Surgeon: Birder Robson, MD;  Location: [facility];  Service: Ophthalmology;  Laterality: Right; 01/11/2022: CATARACT EXTRACTION W/PHACO; Left     Comment:  Procedure: CATARACT EXTRACTION PHACO AND INTRAOCULAR               LENS PLACEMENT (IOC) LEFT DIABETIC 7.19 00:47.6;                Surgeon: Birder Robson, MD;  Location: [facility];  Service: Ophthalmology;  Laterality: Left; 02/16/2018: CHOLECYSTECTOMY; N/A     Comment:  Procedure: LAPAROSCOPIC CHOLECYSTECTOMY;  Surgeon:               Vickie Epley, MD;  Location: [facility];  Service:               General;  Laterality: N/A; 06-01-2011: COLONOSCOPY 12/06/2016: COLONOSCOPY WITH PROPOFOL; N/A     Comment:  Procedure: COLONOSCOPY WITH PROPOFOL;  Surgeon: [name], MD;  Location: [facility];  Service:               Endoscopy;  Laterality: N/A; 07/29/05: ESOPHAGOGASTRODUODENOSCOPY     Comment:  wnl H. H. No date: JOINT REPLACEMENT 05/06/2015: LYMPHADENECTOMY; N/A     Comment:  Procedure: LYMPH NODE DISSECTION;  Surgeon: Alexis Frock, MD;  Location: [facility];  Service: Urology;                Laterality: N/A; 1990's: MOHS SURGERY     Comment:  nose 2014: PROSTATE BIOPSY 03/06/2015: PROSTATE BIOPSY; N/A     Comment:  Procedure: SATURATION BIOPSY TRANSRECTAL ULTRASONIC               PROSTATE (TUBP);  Surgeon: Alexis Frock, MD;  Location:              [facility];  Service: Urology;                Laterality: N/A; 05/06/2015: ROBOT ASSISTED LAPAROSCOPIC RADICAL PROSTATECTOMY; N/A     Comment:  Procedure: ROBOTIC ASSISTED LAPAROSCOPIC RADICAL               PROSTATECTOMY;  Surgeon: Alexis Frock, MD;  Location:               [facility];  Service: Urology;  Laterality: N/A; as a child: TONSILLECTOMY 08/ 2000: TOTAL HIP ARTHROPLASTY; Right   Reproductive/Obstetrics negative OB ROS                             Anesthesia Physical Anesthesia Plan  ASA:  3  Anesthesia Plan: General   Post-op Pain Management:    Induction:   PONV Risk Score and Plan:   Airway Management Planned:   Additional Equipment:   Intra-op Plan:   Post-operative Plan:   Informed Consent: I have reviewed the patients History and Physical, chart, labs and discussed the procedure including the risks, benefits and alternatives for the proposed anesthesia with the patient or authorized representative who has indicated his/her understanding and acceptance.     Dental Advisory Given  Plan Discussed with: CRNA  Anesthesia Plan Comments:         Anesthesia Quick Evaluation

## 2022-04-14 NOTE — Anesthesia Postprocedure Evaluation (Signed)
Anesthesia Post Note  Patient: Guy [name]  Procedure(s) Performed: COLONOSCOPY WITH PROPOFOL  Patient location during evaluation: PACU Anesthesia Type: General Level of consciousness: awake and alert Pain management: pain level controlled Vital Signs Assessment: post-procedure vital signs reviewed and stable Respiratory status: spontaneous breathing, nonlabored ventilation, respiratory function stable and patient connected to nasal cannula oxygen Cardiovascular status: blood pressure returned to baseline and stable Postop Assessment: no apparent nausea or vomiting Anesthetic complications: no   No notable events documented.   Last Vitals:  Vitals:   04/14/22 0949 04/14/22 1048  BP: 134/86 97/68  Pulse: (!) 105   Resp: 18   Temp: (!) 36.4 C 36.4 C  SpO2: 100%     Last Pain:  Vitals:   04/14/22 1048  TempSrc: Temporal  PainSc: 0-No pain                 Molli Barrows

## 2022-04-14 NOTE — H&P (Signed)
[name], MD [address] [name], [place] [address] Phone:[phone] Fax : [phone]  Primary Care Physician:  Tonia Ghent, MD Primary Gastroenterologist:  Dr. Allen Norris  Pre-Procedure History & Physical: HPI:  Guy [name] is a 76 y.o. male is here for an colonoscopy.   Past Medical History:  Diagnosis Date   Actinic keratosis    Anxiety    not clinical   Arthritis    Basal cell carcinoma    Right nasal ala, right temple, nasal dorsum   Cataract    Chronic kidney disease    Stage 3   Diabetes mellitus type II    Diet controlled   Difficult intubation    Noted 02/16/18. Pt unaware of any issue - 12/16/21   Elevated PSA    GERD (gastroesophageal reflux disease)    History of basal cell carcinoma excision    1990's--  moh's surg. of nose   History of hiatal hernia    Neck complaint    "stiffness, catches"-denies numbness, tingling of arms   Prostate cancer ([place]) 2016    Past Surgical History:  Procedure Laterality Date   CATARACT EXTRACTION W/PHACO Right 12/28/2021   Procedure: CATARACT EXTRACTION PHACO AND INTRAOCULAR LENS PLACEMENT (IOC) RIGHT DIABETIC 6.34 00:50.8;  Surgeon: Birder Robson, MD;  Location: [facility];  Service: Ophthalmology;  Laterality: Right;   CATARACT EXTRACTION W/PHACO Left 01/11/2022   Procedure: CATARACT EXTRACTION PHACO AND INTRAOCULAR LENS PLACEMENT (IOC) LEFT DIABETIC 7.19 00:47.6;  Surgeon: Birder Robson, MD;  Location: [facility];  Service: Ophthalmology;  Laterality: Left;   CHOLECYSTECTOMY N/A 02/16/2018   Procedure: LAPAROSCOPIC CHOLECYSTECTOMY;  Surgeon: Vickie Epley, MD;  Location: [facility];  Service: General;  Laterality: N/A;   COLONOSCOPY  06-01-2011   COLONOSCOPY WITH PROPOFOL N/A 12/06/2016   Procedure: COLONOSCOPY WITH PROPOFOL;  Surgeon: [name], MD;  Location: [facility];  Service: Endoscopy;  Laterality: N/A;   ESOPHAGOGASTRODUODENOSCOPY  07/29/05   [facility]    JOINT REPLACEMENT     LYMPHADENECTOMY N/A 05/06/2015   Procedure: LYMPH NODE DISSECTION;  Surgeon: Alexis Frock, MD;  Location: [facility];  Service: Urology;  Laterality: N/A;   MOHS SURGERY  1990's   nose   PROSTATE BIOPSY  2014   PROSTATE BIOPSY N/A 03/06/2015   Procedure: SATURATION BIOPSY TRANSRECTAL ULTRASONIC PROSTATE (TUBP);  Surgeon: Alexis Frock, MD;  Location: [facility];  Service: Urology;  Laterality: N/A;   ROBOT ASSISTED LAPAROSCOPIC RADICAL PROSTATECTOMY N/A 05/06/2015   Procedure: ROBOTIC ASSISTED LAPAROSCOPIC RADICAL PROSTATECTOMY;  Surgeon: Alexis Frock, MD;  Location: [facility];  Service: Urology;  Laterality: N/A;   TONSILLECTOMY  as a child   TOTAL HIP ARTHROPLASTY Right 08/ 2000    Prior to Admission medications   Medication Sig Start Date End Date Taking? Authorizing Provider  acetaminophen (TYLENOL) 500 MG tablet Take 1,000 mg by mouth 2 (two) times daily.    Yes [provider]  Cholecalciferol (VITAMIN D3) 50 MCG (2000 UT) capsule Take 1 capsule (2,000 Units total) by mouth daily. 11/12/21  Yes Tonia Ghent, MD  famotidine (PEPCID) 10 MG tablet Take 10 mg by mouth daily.   Yes [provider]  losartan (COZAAR) 25 MG tablet Take 0.5 tablets (12.5 mg total) by mouth daily. 02/01/22  Yes Tonia Ghent, MD  Multiple Vitamins-Minerals (PRESERVISION AREDS) TABS Take 1 tablet by mouth in the morning and at bedtime.   Yes [provider]  pravastatin (PRAVACHOL)  10 MG tablet Try taking 1 tab daily 08/12/21  Yes Tonia Ghent, MD  Blood Glucose Monitoring Suppl (ONETOUCH VERIO) w/Device KIT 1 kit by Other route daily. Use as instructed to check blood sugar once daily and as needed.  Diagnosis:  E11.9  Non insulin dependent. 08/22/17   Tonia Ghent, MD  diclofenac sodium (VOLTAREN) 1 % GEL Apply topically 4 (four) times daily as needed.    [provider]  glucose blood (ONETOUCH VERIO) test strip CHECK BLOOD GLUCOSE  THREE AS DIRECTED. Dx E11.9. 03/15/22   Tonia Ghent, MD  Lancets [facility] DELICA PLUS [id number]) MISC USE TO CHECK BLOOD SUGAR THREE TIMES A DAY. Dx E11.9. 03/15/22   Tonia Ghent, MD    Allergies as of 03/07/2022 - Review Complete 02/14/2022  Allergen Reaction Noted   Melatonin  09/07/2020   Nsaids Other (See Comments) 01/09/2018   Pravastatin Other (See Comments) 07/20/2016    Family History  Problem Relation Age of Onset   COPD Mother        smoker   Cancer Mother        unknown primary   Alzheimer's disease Father    Hypertension Father    Diabetes Father    Obesity Brother    Hypertension Brother    Diabetes Brother    Heart disease Brother        heart valve   Prostate cancer Neg Hx    Colon cancer Neg Hx     Social History   Socioeconomic History   Marital status: Widowed    Spouse name: Not on file   Number of children: 1   Years of education: Not on file   Highest education level: Not on file  Occupational History   Occupation: Retired 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  Tobacco Use   Smoking status: Never   Smokeless tobacco: Never  Vaping Use   Vaping Use: Never used  Substance and Sexual Activity   Alcohol use: Not Currently    Alcohol/week: 0.0 standard drinks of alcohol    Comment: light beer once a month   Drug use: No   Sexual activity: Not Currently  Other Topics Concern   Not on file  Social History Narrative   [employer] 2 years, E5 ([place] '69-71)   Married 1980, widowed 2014   1 step daughter    Tourist information centre manager   Retired from [employer] Strain: [place]  (02/07/2020)   Overall Financial Resource Strain (CARDIA)    Difficulty of Paying Living Expenses: Not hard at [place]: No [place] (02/07/2020)   Hunger Vital Sign    Worried About [place] in the Last Year: Never true    [place] in the Last Year: Never true  Transportation  Needs: No Transportation Needs (02/07/2020)   PRAPARE - Hydrologist (Medical): No    Lack of Transportation (Non-Medical): No  Physical Activity: Inactive (02/07/2020)   Exercise Vital Sign    Days of Exercise per Week: 0 days    Minutes of Exercise per Session: 0 min  Stress: No Stress Concern Present (02/07/2020)   [place]    Feeling of Stress : Not at all  Social Connections: Not on file  Intimate Partner Violence: Not At Risk (02/07/2020)   Humiliation, Afraid,  Rape, and Kick questionnaire    Fear of Current or Ex-Partner: No    Emotionally Abused: No    Physically Abused: No    Sexually Abused: No    Review of Systems: See HPI, otherwise negative ROS  Physical Exam: There were no vitals taken for this visit. General:   Alert,  pleasant and cooperative in NAD Head:  Normocephalic and atraumatic. Neck:  Supple; no masses or thyromegaly. Lungs:  Clear throughout to auscultation.    Heart:  Regular rate and rhythm. Abdomen:  Soft, nontender and nondistended. Normal bowel sounds, without guarding, and without rebound.   Neurologic:  Alert and  oriented x4;  grossly normal neurologically.  Impression/Plan: Guy [name] is here for an colonoscopy to be performed for a history of adenomatous polyps on 2018   Risks, benefits, limitations, and alternatives regarding  colonoscopy have been reviewed with the patient.  Questions have been answered.  All parties agreeable.   [name], MD  04/14/2022, 9:56 AM

## 2022-04-14 NOTE — Transfer of Care (Signed)
Immediate Anesthesia Transfer of Care Note  Patient: Guy [name]  Procedure(s) Performed: COLONOSCOPY WITH PROPOFOL  Patient Location: PACU  Anesthesia Type:General  Level of Consciousness: drowsy  Airway & Oxygen Therapy: Patient Spontanous Breathing  Post-op Assessment: Report given to RN and Post -op Vital signs reviewed and stable  Post vital signs: Reviewed and stable  Last Vitals:  Vitals Value Taken Time  BP 97/68 04/14/22 1048  Temp 36.4 C 04/14/22 1048  Pulse 94 04/14/22 1049  Resp 9 04/14/22 1049  SpO2 97 % 04/14/22 1049  Vitals shown include unvalidated device data.  Last Pain:  Vitals:   04/14/22 1048  TempSrc: Temporal  PainSc: 0-No pain         Complications: No notable events documented.

## 2022-04-14 NOTE — Op Note (Signed)
[facility] Patient Name: Guy [name] Procedure Date: 04/14/2022 10:23 AM MRN: [id number] Account #: [id number] Date of Birth: August 05, 1945 Admit Type: Outpatient Age: 76 Room: [facility] ENDO ROOM 4 Gender: Male Note Status: Finalized Instrument Name: Park Meo [id number] Procedure:             Colonoscopy Indications:           High risk colon cancer surveillance: Personal history                         of colonic polyps Providers:             [name] MD, MD Referring MD:          Elveria Rising. Damita Dunnings, MD (Referring MD) Medicines:             Propofol per Anesthesia Complications:         No immediate complications. Procedure:             Pre-Anesthesia Assessment:                        - Prior to the procedure, a History and Physical was                         performed, and patient medications and allergies were                         reviewed. The patient's tolerance of previous                         anesthesia was also reviewed. The risks and benefits                         of the procedure and the sedation options and risks                         were discussed with the patient. All questions were                         answered, and informed consent was obtained. Prior                         Anticoagulants: The patient has taken no anticoagulant                         or antiplatelet agents. ASA Grade Assessment: II - A                         patient with mild systemic disease. After reviewing                         the risks and benefits, the patient was deemed in                         satisfactory condition to undergo the procedure.                        After obtaining informed consent, the colonoscope was  passed under direct vision. Throughout the procedure,                         the patient's blood pressure, pulse, and oxygen                         saturations were monitored continuously. The                          Colonoscope was introduced through the anus and                         advanced to the the cecum, identified by appendiceal                         orifice and ileocecal valve. The colonoscopy was                         performed without difficulty. The patient tolerated                         the procedure well. The quality of the bowel                         preparation was excellent. Findings:      The perianal and digital rectal examinations were normal.      A 2 mm polyp was found in the descending colon. The polyp was sessile.       The polyp was removed with a cold biopsy forceps. Resection and       retrieval were complete. Impression:            - One 2 mm polyp in the descending colon, removed with                         a cold biopsy forceps. Complete resection. Polyp                         tissue not retrieved. Recommendation:        - Discharge patient to home.                        - Resume previous diet.                        - Continue present medications.                        - Await pathology results.                        - Repeat colonoscopy in 7 years for surveillance. Procedure Code(s):     --- Professional ---                        [phone], Colonoscopy, flexible; with biopsy, single or                         multiple Diagnosis Code(s):     --- Professional ---  Z86.010, Personal history of colonic polyps                        D12.4, Benign neoplasm of descending colon CPT copyright 2022 American Medical Association. All rights reserved. The codes documented in this report are preliminary and upon coder review may  be revised to meet current compliance requirements. [name] MD, MD 04/14/2022 10:47:45 AM This report has been signed electronically. Number of Addenda: 0 Note Initiated On: 04/14/2022 10:23 AM Scope Withdrawal Time: 0 hours 7 minutes 50 seconds  Total Procedure Duration: 0 hours 9 minutes 50  seconds  Estimated Blood Loss:  Estimated blood loss: none.      [facility]

## 2022-04-15 ENCOUNTER — Encounter: Payer: Self-pay | Admitting: Gastroenterology

## 2022-04-18 ENCOUNTER — Encounter: Payer: Self-pay | Admitting: Gastroenterology

## 2022-04-18 DIAGNOSIS — C61 Malignant neoplasm of prostate: Secondary | ICD-10-CM | POA: Diagnosis not present

## 2022-04-18 DIAGNOSIS — N393 Stress incontinence (female) (male): Secondary | ICD-10-CM | POA: Diagnosis not present

## 2022-04-18 LAB — SURGICAL PATHOLOGY

## 2022-05-02 ENCOUNTER — Other Ambulatory Visit: Payer: Self-pay | Admitting: Family Medicine

## 2022-05-05 ENCOUNTER — Ambulatory Visit (INDEPENDENT_AMBULATORY_CARE_PROVIDER_SITE_OTHER): Payer: Medicare Other | Admitting: Family Medicine

## 2022-05-05 ENCOUNTER — Encounter: Payer: Self-pay | Admitting: Family Medicine

## 2022-05-05 VITALS — BP 114/68 | HR 85 | Temp 97.8°F | Ht 65.5 in | Wt 150.0 lb

## 2022-05-05 DIAGNOSIS — H353211 Exudative age-related macular degeneration, right eye, with active choroidal neovascularization: Secondary | ICD-10-CM | POA: Diagnosis not present

## 2022-05-05 DIAGNOSIS — I10 Essential (primary) hypertension: Secondary | ICD-10-CM | POA: Diagnosis not present

## 2022-05-05 DIAGNOSIS — E119 Type 2 diabetes mellitus without complications: Secondary | ICD-10-CM

## 2022-05-05 DIAGNOSIS — R21 Rash and other nonspecific skin eruption: Secondary | ICD-10-CM

## 2022-05-05 LAB — POCT GLYCOSYLATED HEMOGLOBIN (HGB A1C): Hemoglobin A1C: 6.5 % — AB (ref 4.0–5.6)

## 2022-05-05 MED ORDER — TRIAMCINOLONE ACETONIDE 0.1 % EX CREA: 1.0000 | TOPICAL_CREAM | Freq: Two times a day (BID) | CUTANEOUS | 1 refills | Status: DC

## 2022-05-05 MED ORDER — PRAVASTATIN SODIUM 10 MG PO TABS: 10.0000 mg | ORAL_TABLET | Freq: Every day | ORAL | 3 refills | Status: DC

## 2022-05-05 NOTE — Progress Notes (Signed)
Diabetes:  No meds.   Hypoglycemic episodes: no Hyperglycemic episodes:no Feet problems:no Blood Sugars averaging: usually ~100 at night, before bed.  100-125 in the AMs.   eye exam within last year:  yes Taking pravastatin '10mg'$  a day.  Tolerating that.   Still on losartan 12.'5mg'$  a day and not lightheaded.   A1c 6.5, d/w pt.    He has been caring for his brother.  His brother is getting help through the [place] now.    He had a sweaty episode with vomiting around the time of a BM, straining at the time.  Happened a few times in the last 20 years.  No syncope.  No residual sx.  Vagal episodes d/w pt.  Cautions d/w pt.    Itchy rash on the R forearm.  He thought it was dry skin.  Improved with vaseline.    Flu shot to be done at the pharmacy.    Meds, vitals, and allergies reviewed.  ROS: Per HPI unless specifically indicated in ROS section   GEN: nad, alert and oriented HEENT: mucous membranes moist NECK: supple w/o LA CV: rrr. PULM: ctab, no inc wob ABD: soft, +bs EXT: no edema SKIN: dry skin noted in the R>L forearm.   Recheck blood pressure right arm 120/70, left arm 114/68.

## 2022-05-05 NOTE — Patient Instructions (Addendum)
Recheck in about 3 months.  A1c at the visit.   Take care.  Glad to see you. Use TAC if needed on the rash.

## 2022-05-08 DIAGNOSIS — R21 Rash and other nonspecific skin eruption: Secondary | ICD-10-CM | POA: Insufficient documentation

## 2022-05-08 NOTE — Assessment & Plan Note (Signed)
Reasonable control without medications at this point.  Continue pravastatin and losartan.  Recheck periodically.  Continue work on diet and exercise.  I thanked him for his effort.

## 2022-05-08 NOTE — Assessment & Plan Note (Signed)
I suspect his initial blood pressure reading in clinic was artificially low.  Recheck is reassuring.  We discussed that he could have had vagal symptoms when he was straining with a bowel movement.  This is happened a few times in the last 20 years.  He did not pass out with the recent event.  Discussed limiting straining and staying well-hydrated.  No change in losartan at this point.  Routine cautions given to patient.

## 2022-05-08 NOTE — Assessment & Plan Note (Signed)
Looks like a benign rash with dry skin.  He can use triamcinolone and update me as needed.

## 2022-06-02 DIAGNOSIS — Z23 Encounter for immunization: Secondary | ICD-10-CM | POA: Diagnosis not present

## 2022-06-03 DIAGNOSIS — H353211 Exudative age-related macular degeneration, right eye, with active choroidal neovascularization: Secondary | ICD-10-CM | POA: Diagnosis not present

## 2022-07-08 DIAGNOSIS — H353211 Exudative age-related macular degeneration, right eye, with active choroidal neovascularization: Secondary | ICD-10-CM | POA: Diagnosis not present

## 2022-07-13 DIAGNOSIS — Z23 Encounter for immunization: Secondary | ICD-10-CM | POA: Diagnosis not present

## 2022-08-05 ENCOUNTER — Encounter: Payer: Self-pay | Admitting: Family Medicine

## 2022-08-05 ENCOUNTER — Ambulatory Visit (INDEPENDENT_AMBULATORY_CARE_PROVIDER_SITE_OTHER): Payer: Medicare Other | Admitting: Family Medicine

## 2022-08-05 VITALS — BP 122/80 | HR 78 | Temp 97.3°F | Ht 65.5 in | Wt 150.0 lb

## 2022-08-05 DIAGNOSIS — E119 Type 2 diabetes mellitus without complications: Secondary | ICD-10-CM | POA: Diagnosis not present

## 2022-08-05 LAB — POCT GLYCOSYLATED HEMOGLOBIN (HGB A1C): Hemoglobin A1C: 7 % — AB (ref 4.0–5.6)

## 2022-08-05 NOTE — Patient Instructions (Addendum)
Thanks for your effort.   Recheck A1c at a visit in about 3 months.  Update me as needed.  Take care.  Glad to see you.

## 2022-08-05 NOTE — Progress Notes (Unsigned)
Diabetes:  No meds. Still on statin.   Hypoglycemic episodes: no Hyperglycemic episodes:see below.  Feet problems:no Blood Sugars averaging: see below.  eye exam within last year: yes A1c 7.  He had an illness just prior to Christmas, diet was affected for 2 weeks after that and his sugar was elevated w/o inc in intake. Sugar normalized in the AMs recently, usually 90-120 in the AMs now.    Urine Microalbumin mg/dL <0.2 (03/02/22) He is going to f/u with renal clinic.    Massage helped his upper back pain.    Meds, vitals, and allergies reviewed.   ROS: Per HPI unless specifically indicated in ROS section   GEN: nad, alert and oriented HEENT: ncat NECK: supple w/o LA CV: rrr. PULM: ctab, no inc wob ABD: soft, +bs EXT: no edema SKIN: no acute rash  20 minutes were devoted to patient care in this encounter (this includes time spent reviewing the patient's file/history, interviewing and examining the patient, counseling/reviewing plan with patient).

## 2022-08-07 NOTE — Assessment & Plan Note (Signed)
A1c 7.  He had an illness just prior to Christmas, diet was affected for 2 weeks after that and his sugar was elevated w/o inc in intake. Sugar normalized in the AMs recently, usually 90-120 in the AMs now.   Urine Microalbumin mg/dL <0.2 (03/02/22) He is going to f/u with renal clinic.   I do not think it makes sense to change his medications at this point.  Recheck periodically.  He agrees to plan.  Continue work on diet and exercise.

## 2022-08-08 DIAGNOSIS — H353211 Exudative age-related macular degeneration, right eye, with active choroidal neovascularization: Secondary | ICD-10-CM | POA: Diagnosis not present

## 2022-08-31 DIAGNOSIS — I10 Essential (primary) hypertension: Secondary | ICD-10-CM | POA: Diagnosis not present

## 2022-08-31 DIAGNOSIS — N1832 Chronic kidney disease, stage 3b: Secondary | ICD-10-CM | POA: Diagnosis not present

## 2022-08-31 DIAGNOSIS — I129 Hypertensive chronic kidney disease with stage 1 through stage 4 chronic kidney disease, or unspecified chronic kidney disease: Secondary | ICD-10-CM | POA: Diagnosis not present

## 2022-08-31 DIAGNOSIS — N2581 Secondary hyperparathyroidism of renal origin: Secondary | ICD-10-CM | POA: Diagnosis not present

## 2022-09-05 DIAGNOSIS — E1122 Type 2 diabetes mellitus with diabetic chronic kidney disease: Secondary | ICD-10-CM | POA: Diagnosis not present

## 2022-09-05 DIAGNOSIS — I129 Hypertensive chronic kidney disease with stage 1 through stage 4 chronic kidney disease, or unspecified chronic kidney disease: Secondary | ICD-10-CM | POA: Diagnosis not present

## 2022-09-05 DIAGNOSIS — N1832 Chronic kidney disease, stage 3b: Secondary | ICD-10-CM | POA: Diagnosis not present

## 2022-09-05 DIAGNOSIS — N2581 Secondary hyperparathyroidism of renal origin: Secondary | ICD-10-CM | POA: Diagnosis not present

## 2022-09-05 DIAGNOSIS — I10 Essential (primary) hypertension: Secondary | ICD-10-CM | POA: Diagnosis not present

## 2022-09-08 DIAGNOSIS — H353211 Exudative age-related macular degeneration, right eye, with active choroidal neovascularization: Secondary | ICD-10-CM | POA: Diagnosis not present

## 2022-10-06 DIAGNOSIS — H353211 Exudative age-related macular degeneration, right eye, with active choroidal neovascularization: Secondary | ICD-10-CM | POA: Diagnosis not present

## 2022-11-03 ENCOUNTER — Ambulatory Visit (INDEPENDENT_AMBULATORY_CARE_PROVIDER_SITE_OTHER): Payer: Medicare Other | Admitting: Family Medicine

## 2022-11-03 ENCOUNTER — Encounter: Payer: Self-pay | Admitting: Family Medicine

## 2022-11-03 VITALS — BP 112/78 | HR 72 | Temp 97.9°F | Ht 65.5 in | Wt 149.0 lb

## 2022-11-03 DIAGNOSIS — E119 Type 2 diabetes mellitus without complications: Secondary | ICD-10-CM | POA: Diagnosis not present

## 2022-11-03 LAB — POCT GLYCOSYLATED HEMOGLOBIN (HGB A1C): Hemoglobin A1C: 7 % — AB (ref 4.0–5.6)

## 2022-11-03 NOTE — Patient Instructions (Addendum)
Recheck at a yearly visit in about 3 months.  Labs ahead of time.  Take care.  Glad to see you. Thanks for your effort.

## 2022-11-03 NOTE — Progress Notes (Signed)
Diabetes:  No meds.   Hypoglycemic episodes: no Hyperglycemic episodes:no Feet problems: no Blood Sugars averaging: usually 109-120.   eye exam within last year: has f/u pending for tomorrow.   A1c 7.  D/w at OV.  Stable.   His granddaughter is playing soccer in high school and will attend [employer] this fall.    [facility].  He was in an area with Agent Orange exposure in [place].    Meds, vitals, and allergies reviewed.   ROS: Per HPI unless specifically indicated in ROS section   GEN: nad, alert and oriented HEENT: ncat NECK: supple w/o LA CV: rrr. PULM: ctab, no inc wob ABD: soft, +bs EXT: no edema SKIN: well perfused.

## 2022-11-04 DIAGNOSIS — H353211 Exudative age-related macular degeneration, right eye, with active choroidal neovascularization: Secondary | ICD-10-CM | POA: Diagnosis not present

## 2022-11-06 ENCOUNTER — Encounter: Payer: Self-pay | Admitting: Family Medicine

## 2022-11-06 NOTE — Assessment & Plan Note (Addendum)
A1c 7.  D/w at OV.  Stable.  Continue work on diet and exercise. Recheck periodically. [facility].  He was in an area with Agent Orange exposure in [place].

## 2022-11-08 NOTE — Telephone Encounter (Signed)
Can you please help this gentleman with a record release?  Thanks.

## 2022-11-10 ENCOUNTER — Encounter: Payer: Self-pay | Admitting: Dermatology

## 2022-11-10 NOTE — Telephone Encounter (Signed)
Please let him know I would not recommend using it without having seen him recently in clinic to check the area since it could actually make it harder to diagnose a skin cancer if it is used on a skin cancer. It is only for precancers, but I do not know what he has without seeing him. I am happy to add him to clinic afternoon June 4th if nothing else open to check the areas and be sure the cream is okay for him to use. Thank you!

## 2022-11-22 ENCOUNTER — Ambulatory Visit (INDEPENDENT_AMBULATORY_CARE_PROVIDER_SITE_OTHER): Payer: Medicare Other | Admitting: Dermatology

## 2022-11-22 VITALS — BP 109/69 | HR 76

## 2022-11-22 DIAGNOSIS — Z5111 Encounter for antineoplastic chemotherapy: Secondary | ICD-10-CM | POA: Diagnosis not present

## 2022-11-22 DIAGNOSIS — L570 Actinic keratosis: Secondary | ICD-10-CM

## 2022-11-22 DIAGNOSIS — D485 Neoplasm of uncertain behavior of skin: Secondary | ICD-10-CM | POA: Diagnosis not present

## 2022-11-22 MED ORDER — FLUOROURACIL 5 % EX CREA: TOPICAL_CREAM | CUTANEOUS | 0 refills | Status: DC

## 2022-11-22 NOTE — Patient Instructions (Addendum)
Your prescription was sent to [facility] in [place]. A representative from [employer] will contact you within 2 business hours to verify your address and insurance information to schedule a free delivery. If for any reason you do not receive a phone call from them, please reach out to them. Their phone number is [phone] and their hours are Monday-Friday 9:00 am-5:00 pm.     5-Fluorouracil Patient Education   Actinic keratoses are the dry, red scaly spots on the skin caused by sun damage. A portion of these spots can turn into skin cancer with time, and treating them can help prevent development of skin cancer.   Treatment of these spots requires removal of the defective skin cells. There are various ways to remove actinic keratoses, including freezing with liquid nitrogen, treatment with creams, or treatment with a blue light procedure in the office.   5-fluorouracil cream is a topical cream used to treat actinic keratoses. It works by interfering with the growth of abnormal fast-growing skin cells, such as actinic keratoses. These cells peel off and are replaced by healthy ones. THIS CREAM SHOULD BE KEPT OUT OF REACH OF CHILDREN AND PETS AND SHOULD NOT BE USED BY PREGNANT WOMEN.  INSTRUCTIONS FOR 5-FLUOROURACIL CREAM:   5-fluorouracil cream typically needs to be used for 7-14 days depending on the location of treatment. A thin layer should be applied twice a day to the treatment areas recommended by your physician.    Avoid contact with your eyes or nostrils. Avoid applying the cream to your eyelids or lips unless directed to apply there by your physician. Do not use 5-fluorouracil on infected or open wounds.   You will develop redness, irritation and some crusting at areas where you have pre-cancer damage/actinic keratoses. IF YOU DEVELOP PAIN, BLEEDING, OR SIGNIFICANT CRUSTING, STOP THE TREATMENT EARLY - you have already gotten a good response and the actinic keratoses should  clear up well.  Wash your hands after applying 5-fluorouracil 5% cream on your skin.   A moisturizer or sunscreen with a minimum SPF 30 should be applied each morning.   Once you have finished the treatment, you can apply a thin layer of Vaseline twice a day to irritated areas to soothe and calm the areas more quickly. If you experience significant discomfort, contact your physician.  For some patients it is necessary to repeat the treatment for best results.  SIDE EFFECTS: When using 5-fluorouracil cream, you may have mild irritation, such as redness, dryness, swelling, or a mild burning sensation. This usually resolves within 2 weeks. The more actinic keratoses you have, the more redness and inflammation you can expect during treatment. Eye irritation has been reported rarely. If this occurs, please let us know.   If you have any trouble using this cream, please send [place] a MyChart Message or call the office. If you have any other questions about this information, please do not hesitate to ask me before you leave the office or reach out on MyChart or by phone.   Due to recent changes in healthcare laws, you may see results of your pathology and/or laboratory studies on MyChart before the doctors have had a chance to review them. We understand that in some cases there may be results that are confusing or concerning to you. Please understand that not all results are received at the same time and often the doctors may need to interpret multiple results in order to provide you with the best plan of care or course  of treatment. Therefore, we ask that you please give [place] 2 business days to thoroughly review all your results before contacting the office for clarification. Should we see a critical lab result, you will be contacted sooner.   If You Need Anything After Your Visit  If you have any questions or concerns for your doctor, please call our main line at [phone] and press option 4 to reach  your doctor's medical assistant. If no one answers, please leave a voicemail as directed and we will return your call as soon as possible. Messages left after 4 pm will be answered the following business day.   You may also send [place] a message via MyChart. We typically respond to MyChart messages within 1-2 business days.  For prescription refills, please ask your pharmacy to contact our office. Our fax number is [phone].  If you have an urgent issue when the clinic is closed that cannot wait until the next business day, you can page your doctor at the number below.    Please note that while we do our best to be available for urgent issues outside of office hours, we are not available 24/7.   If you have an urgent issue and are unable to reach [place], you may choose to seek medical care at your doctor's office, retail clinic, urgent care center, or emergency room.  If you have a medical emergency, please immediately call 911 or go to the emergency department.  Pager Numbers  - Dr. Gwen Pounds: [phone]  - Dr. Neale Burly: [phone]  - Dr. Roseanne Reno: [phone]  In the event of inclement weather, please call our main line at [phone] for an update on the status of any delays or closures.  Dermatology Medication Tips: Please keep the boxes that topical medications come in in order to help keep track of the instructions about where and how to use these. Pharmacies typically print the medication instructions only on the boxes and not directly on the medication tubes.   If your medication is too expensive, please contact our office at [phone] option 4 or send [place] a message through MyChart.   We are unable to tell what your co-pay for medications will be in advance as this is different depending on your insurance coverage. However, we may be able to find a substitute medication at lower cost or fill out paperwork to get insurance to cover a needed medication.   If a prior authorization  is required to get your medication covered by your insurance company, please allow [place] 1-2 business days to complete this process.  Drug prices often vary depending on where the prescription is filled and some pharmacies may offer cheaper prices.  The website www.goodrx.com contains coupons for medications through different pharmacies. The prices here do not account for what the cost may be with help from insurance (it may be cheaper with your insurance), but the website can give you the price if you did not use any insurance.  - You can print the associated coupon and take it with your prescription to the pharmacy.  - You may also stop by our office during regular business hours and pick up a GoodRx coupon card.  - If you need your prescription sent electronically to a different pharmacy, notify our office through [facility] or by phone at [phone] option 4.     Si Usted Necesita Algo Despus de Su Visita  Tambin puede enviarnos un mensaje a travs de Clinical cytogeneticist. Por lo general respondemos a los  mensajes de MyChart en el transcurso de 1 a 2 das hbiles.  Para renovar recetas, por favor pida a su farmacia que se ponga en contacto con nuestra oficina. Annie Sable de fax es [place] [phone].  Si tiene un asunto urgente cuando la clnica est cerrada y que no puede esperar hasta el siguiente da hbil, puede llamar/localizar a su doctor(a) al nmero que aparece a continuacin.   Por favor, tenga en cuenta que aunque hacemos todo lo posible para estar disponibles para asuntos urgentes fuera del horario de [place], no estamos disponibles las 24 horas del da, los 7 [address]  [address] de la [place].   Si tiene un problema urgente y no puede comunicarse con nosotros, puede optar por buscar atencin mdica  en el consultorio de su doctor(a), en una clnica privada, en un centro de atencin urgente o en una sala de emergencias.  Si tiene Engineer, drilling, por favor llame inmediatamente al 911 o  vaya a la sala de emergencias.  Nmeros de bper  - Dr. Gwen Pounds: [phone]  - Dra. Moye: [phone]  - Dra. Roseanne Reno: [phone]  En caso de inclemencias del [place], por favor llame a Lacy Duverney principal al [phone] para una actualizacin sobre el [place] de cualquier retraso o cierre.  Consejos para la medicacin en dermatologa: Por favor, guarde las cajas en las que vienen los medicamentos de uso tpico para ayudarle a seguir las instrucciones sobre dnde y cmo usarlos. Las farmacias generalmente imprimen las instrucciones del medicamento slo en las cajas y no directamente en los tubos del [place].   Si su medicamento es muy caro, por favor, pngase en contacto con Rolm Gala llamando al [phone] y presione la opcin 4 o envenos un mensaje a travs de Clinical cytogeneticist.   No podemos decirle cul ser su copago por los medicamentos por adelantado ya que esto es diferente dependiendo de la cobertura de su seguro. Sin embargo, es posible que podamos encontrar un medicamento sustituto a Audiological scientist un formulario para que el seguro cubra el medicamento que se considera necesario.   Si se requiere una autorizacin previa para que su compaa de seguros [place] su medicamento, por favor permtanos de 1 a 2 das hbiles para completar [address]  Los precios de los medicamentos varan con frecuencia dependiendo del Environmental consultant de dnde se surte la receta y alguna farmacias pueden ofrecer precios ms baratos.  El sitio web www.goodrx.com tiene cupones para medicamentos de Health and safety inspector. Los precios aqu no tienen en cuenta lo que podra costar con la ayuda del seguro (puede ser ms barato con su seguro), pero el sitio web puede darle el precio si no utiliz Tourist information centre manager.  - Puede imprimir el cupn correspondiente y llevarlo con su receta a la farmacia.  - Tambin puede pasar por nuestra oficina durante el horario de atencin regular y Education officer, museum una tarjeta de cupones  de GoodRx.  - Si necesita que su receta se enve electrnicamente a una farmacia diferente, informe a nuestra oficina a travs de MyChart de [place] o por telfono llamando al [phone] y presione la opcin 4.

## 2022-11-22 NOTE — Progress Notes (Signed)
   Follow-Up Visit   Subjective  Guy [name] is a 77 y.o. male who presents for the following: scaly patch on the R cheek that will not resolve. Patient was going to use topical chemotherapy cream on it, but Dr. Neale Burly prefers to see lesion first before refilling prescription.   The following portions of the chart were reviewed this encounter and updated as appropriate: medications, allergies, medical history  Review of Systems:  No other skin or systemic complaints except as noted in HPI or Assessment and Plan.  Objective  Well appearing patient in no apparent distress; mood and affect are within normal limits.   A focused examination was performed of the following areas:   Relevant exam findings are noted in the Assessment and Plan.    Assessment & Plan   ACTINIC KERATOSIS Exam: Erythematous thin papules/macules with gritty scale at the L temple, right cheek x 2 without features suspicious for malignancy on dermoscopy  Actinic keratoses are precancerous spots that appear secondary to cumulative UV radiation exposure/sun exposure over time. They are chronic with expected duration over 1 year. A portion of actinic keratoses will progress to squamous cell carcinoma of the skin. It is not possible to reliably predict which spots will progress to skin cancer and so treatment is recommended to prevent development of skin cancer.  Recommend daily broad spectrum sunscreen SPF 30+ to sun-exposed areas, reapply every 2 hours as needed.  Recommend staying in the shade or wearing long sleeves, sun glasses (UVA+UVB protection) and wide brim hats (4-inch brim around the entire circumference of the hat). Call for new or changing lesions.  Treatment Plan: Start 5-fluorouracil cream twice a day for 7 days to affected areas including L temple, R cheek x 2.  Reviewed course of treatment and expected reaction.  Patient advised to expect inflammation and crusting and advised that erosions are possible.   Patient advised to be diligent with sun protection during and after treatment. Handout with details of how to apply medication and what to expect provided. Counseled to keep medication out of reach of children and pets.  Reviewed course of treatment and expected reaction.  Patient advised to expect inflammation and crusting and advised that erosions are possible.  Patient advised to be diligent with sun protection during and after treatment. Handout with details of how to apply medication and what to expect provided. Counseled to keep medication out of reach of children and pets.  NEOPLASM OF UNCERTAIN BEHAVIOR Exam: erythematous papule left infraauricular neck without features suspicious for malignancy on dermoscopy  Favor inflammatory papule  Treatment Plan: Recheck at follow-up. Call for changes.   Return for appointment as scheduled.  Maylene Roes, CMA, am acting as scribe for Darden Dates, MD .  Documentation: I have reviewed the above documentation for accuracy and completeness, and I agree with the above.  Darden Dates, MD

## 2022-12-02 DIAGNOSIS — H353211 Exudative age-related macular degeneration, right eye, with active choroidal neovascularization: Secondary | ICD-10-CM | POA: Diagnosis not present

## 2022-12-02 LAB — HM DIABETES EYE EXAM

## 2023-01-06 DIAGNOSIS — H353211 Exudative age-related macular degeneration, right eye, with active choroidal neovascularization: Secondary | ICD-10-CM | POA: Diagnosis not present

## 2023-01-18 ENCOUNTER — Encounter (INDEPENDENT_AMBULATORY_CARE_PROVIDER_SITE_OTHER): Payer: Self-pay

## 2023-01-22 ENCOUNTER — Other Ambulatory Visit: Payer: Self-pay | Admitting: Family Medicine

## 2023-01-22 DIAGNOSIS — E119 Type 2 diabetes mellitus without complications: Secondary | ICD-10-CM

## 2023-01-22 DIAGNOSIS — Z125 Encounter for screening for malignant neoplasm of prostate: Secondary | ICD-10-CM

## 2023-01-27 ENCOUNTER — Other Ambulatory Visit: Payer: Medicare Other

## 2023-01-27 ENCOUNTER — Other Ambulatory Visit: Payer: Self-pay | Admitting: Family Medicine

## 2023-01-27 MED ORDER — LOKELMA 10 G PO PACK: 10.0000 g | PACK | Freq: Once | ORAL | 0 refills | Status: AC

## 2023-01-27 NOTE — Telephone Encounter (Signed)
Potassium levels were at 5.8,over the high level of range  BUN 43  Creatinine 2.22 Patient did not get copy was just called by [facility] and asked to let PCP know. Patient has CPE labs next week with CPE scheduled for next Friday.   Denies any medication changes. Did have "flu" symptoms this week. Labs were taken the day before he started having symptoms. Denies any cardiac symptoms. Denies any changes in urinary output. No side or flank pain.

## 2023-01-27 NOTE — Telephone Encounter (Signed)
Patient received labwork through the [facility] hospital , and they contacted him letting him know that his potassium levels are alarming , and creatinine levels as well. He is requesting a phone call to discuss with Dr Para March.  Potassium levels were at 5.8,over the high level of range

## 2023-01-27 NOTE — Telephone Encounter (Signed)
Spoke with patient about recommendations from Dr. Para March and erx sent to [facility].

## 2023-01-27 NOTE — Telephone Encounter (Signed)
Cr is not dramatically different from prior but potassium is elevated.   If no meds given per [place], then drink enough water to keep urine clear and take 1 dose of lokelma.  Please verify pharmacy with patient and send.   We can recheck labs as scheduled.  Thanks.

## 2023-01-31 ENCOUNTER — Other Ambulatory Visit (INDEPENDENT_AMBULATORY_CARE_PROVIDER_SITE_OTHER): Payer: Medicare Other

## 2023-01-31 DIAGNOSIS — E119 Type 2 diabetes mellitus without complications: Secondary | ICD-10-CM | POA: Diagnosis not present

## 2023-01-31 DIAGNOSIS — Z125 Encounter for screening for malignant neoplasm of prostate: Secondary | ICD-10-CM

## 2023-02-02 ENCOUNTER — Ambulatory Visit (INDEPENDENT_AMBULATORY_CARE_PROVIDER_SITE_OTHER): Payer: Medicare Other

## 2023-02-02 VITALS — Wt 149.0 lb

## 2023-02-02 DIAGNOSIS — Z0000 Encounter for general adult medical examination without abnormal findings: Secondary | ICD-10-CM

## 2023-02-02 NOTE — Progress Notes (Signed)
Subjective:   Guy [name] is a 77 y.o. male who presents for Medicare Annual/Subsequent preventive examination.  Visit Complete: Virtual  I connected with  [name] on 02/02/23 by a audio enabled telemedicine application and verified that I am speaking with the correct person using two identifiers.  Patient Location: Home  Provider Location: Office/Clinic  I discussed the limitations of evaluation and management by telemedicine. The patient expressed understanding and agreed to proceed.  Patient Medicare AWV questionnaire was completed by the patient on 01/29/23; I have confirmed that all information answered by patient is correct and no changes since this date.  Vital Signs: Unable to obtain new vitals due to this being a telehealth visit.   Review of Systems     Cardiac Risk Factors include: advanced age (>63men, >62 women);diabetes mellitus;hypertension;male gender;dyslipidemia     Objective:    Today's Vitals   02/02/23 1548  Weight: 149 lb (67.6 kg)   Body mass index is 24.42 kg/m.     02/02/2023    3:51 PM 01/11/2022    7:13 AM 12/28/2021    8:25 AM 11/20/2020   11:00 AM 02/07/2020    9:51 AM 01/04/2019   10:10 AM 02/16/2018    6:30 AM  Advanced Directives  Does Patient Have a Medical Advance Directive? Yes Yes Yes Yes Yes Yes Yes  Type of Estate agent of [place];Living will Healthcare Power of [place];Living will Healthcare Power of [place];Living will Healthcare Power of [place];Living will Healthcare Power of [place];Living will Healthcare Power of [place];Living will Healthcare Power of [place];Living will  Does patient want to make changes to medical advance directive? No - Patient declined No - Patient declined No - Patient declined No - Patient declined  No - Patient declined No - Patient declined  Copy of Healthcare Power of Attorney in Chart? Yes - validated most recent copy scanned in chart (See row information) No - copy  requested No - copy requested  Yes - validated most recent copy scanned in chart (See row information) No - copy requested Yes    Current Medications (verified) Outpatient Encounter Medications as of 02/02/2023  Medication Sig   acetaminophen (TYLENOL) 500 MG tablet Take 1,000 mg by mouth 2 (two) times daily.    Blood Glucose Monitoring Suppl (ONETOUCH VERIO) w/Device KIT 1 kit by Other route daily. Use as instructed to check blood sugar once daily and as needed.  Diagnosis:  E11.9  Non insulin dependent.   Cholecalciferol (VITAMIN D3) 50 MCG (2000 UT) capsule Take 1 capsule (2,000 Units total) by mouth daily.   diclofenac sodium (VOLTAREN) 1 % GEL Apply topically 4 (four) times daily as needed.   famotidine (PEPCID) 10 MG tablet Take 10 mg by mouth daily.   fluorouracil (EFUDEX) 5 % cream Apply to aa's R cheek x 2, L temple x 1 BID x 7 days.   glucose blood (ONETOUCH VERIO) test strip CHECK BLOOD GLUCOSE THREE AS DIRECTED. Dx E11.9.   Lancets (ONETOUCH DELICA PLUS LANCET33G) MISC USE TO CHECK BLOOD SUGAR THREE TIMES A DAY. Dx E11.9.   losartan (COZAAR) 25 MG tablet Take 0.5 tablets (12.5 mg total) by mouth daily.   Multiple Vitamins-Minerals (PRESERVISION AREDS) TABS Take 1 tablet by mouth in the morning and at bedtime.   pravastatin (PRAVACHOL) 10 MG tablet Take 1 tablet (10 mg total) by mouth daily.   No facility-administered encounter medications on file as of 02/02/2023.    Allergies (verified) Melatonin, Nsaids, and Pravastatin  History: Past Medical History:  Diagnosis Date   Actinic keratosis    Anxiety    not clinical   Arthritis    Basal cell carcinoma    Right nasal ala, right temple, nasal dorsum   Cataract    Chronic kidney disease    Stage 3   Diabetes mellitus type II    Diet controlled   Difficult intubation    Noted 02/16/18. Pt unaware of any issue - 12/16/21   Elevated PSA    GERD (gastroesophageal reflux disease)    History of basal cell carcinoma excision     1990's--  moh's surg. of nose   History of hiatal hernia    Neck complaint    "stiffness, catches"-denies numbness, tingling of arms   Prostate cancer (HCC) 2016   Past Surgical History:  Procedure Laterality Date   CATARACT EXTRACTION W/PHACO Right 12/28/2021   Procedure: CATARACT EXTRACTION PHACO AND INTRAOCULAR LENS PLACEMENT (IOC) RIGHT DIABETIC 6.34 00:50.8;  Surgeon: Galen Manila, MD;  Location: [facility];  Service: Ophthalmology;  Laterality: Right;   CATARACT EXTRACTION W/PHACO Left 01/11/2022   Procedure: CATARACT EXTRACTION PHACO AND INTRAOCULAR LENS PLACEMENT (IOC) LEFT DIABETIC 7.19 00:47.6;  Surgeon: Galen Manila, MD;  Location: [facility];  Service: Ophthalmology;  Laterality: Left;   CHOLECYSTECTOMY N/A 02/16/2018   Procedure: LAPAROSCOPIC CHOLECYSTECTOMY;  Surgeon: Ancil Linsey, MD;  Location: [facility];  Service: General;  Laterality: N/A;   COLONOSCOPY  06-01-2011   COLONOSCOPY WITH PROPOFOL N/A 12/06/2016   Procedure: COLONOSCOPY WITH PROPOFOL;  Surgeon: Midge Minium, MD;  Location: [facility] END[facility]COPY;  Service: Endoscopy;  Laterality: N/A;   COLONOSCOPY WITH PROPOFOL N/A 04/14/2022   Procedure: COLONOSCOPY WITH PROPOFOL;  Surgeon: Midge Minium, MD;  Location: [facility];  Service: Endoscopy;  Laterality: N/A;   ESOPHAGOGASTRODUODENOSCOPY  07/29/05   [facility]   JOINT REPLACEMENT     LYMPHADENECTOMY N/A 05/06/2015   Procedure: LYMPH NODE DISSECTION;  Surgeon: Sebastian Ache, MD;  Location: [facility];  Service: Urology;  Laterality: N/A;   MOHS SURGERY  1990's   nose   PROSTATE BIOPSY  2014   PROSTATE BIOPSY N/A 03/06/2015   Procedure: SATURATION BIOPSY TRANSRECTAL ULTRASONIC PROSTATE (TUBP);  Surgeon: Sebastian Ache, MD;  Location: [facility];  Service: Urology;  Laterality: N/A;   ROBOT ASSISTED LAPAROSCOPIC RADICAL PROSTATECTOMY N/A 05/06/2015   Procedure: ROBOTIC ASSISTED LAPAROSCOPIC RADICAL PROSTATECTOMY;  Surgeon:  Sebastian Ache, MD;  Location: [facility];  Service: Urology;  Laterality: N/A;   TONSILLECTOMY  as a child   TOTAL HIP ARTHROPLASTY Right 08/ 2000   Family History  Problem Relation Age of Onset   COPD Mother        smoker   Cancer Mother        unknown primary   Alzheimer's disease Father    Hypertension Father    Diabetes Father    Obesity Brother    Hypertension Brother    Diabetes Brother    Prostate cancer Brother    Heart disease Brother        heart valve   Colon cancer Neg Hx    Social History   Socioeconomic History   Marital status: Widowed    Spouse name: Not on file   Number of children: 1   Years of education: Not on file   Highest education level: Some college, no degree  Occupational History   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     Married 1980, widowed 2014   1 step daughter    Herbalist   Retired from [employer]   Social Determinants of [employer]: Low Risk  (01/29/2023)   Overall Financial Resource Strain (CARDIA)    Difficulty of Paying Living Expenses: Not hard at all  Food Insecurity: No Food Insecurity (01/29/2023)   Hunger Vital Sign    Worried About Running Out of Food in the Last Year: Never true    Ran Out of Food in the Last Year: Never true  Transportation Needs: No Transportation Needs (01/29/2023)   PRAPARE - Administrator, Civil Service (Medical): No    Lack of Transportation (Non-Medical): No  Physical Activity:  Insufficiently Active (01/29/2023)   Exercise Vital Sign    Days of Exercise per Week: 4 days    Minutes of Exercise per Session: 30 min  Stress: Stress Concern Present (01/29/2023)   [employer] of Occupational Health - Occupational Stress Questionnaire    Feeling of Stress : To some extent  Social Connections: Moderately Integrated (01/29/2023)   Social Connection and Isolation Panel [NHANES]    Frequency of Communication with Friends and Family: More than three times a week    Frequency of Social Gatherings with Friends and Family: More than three times a week    Attends Religious Services: 1 to 4 times per year    Active Member of [employer] or Organizations: No    Attends Banker Meetings: 1 to 4 times per year    Marital Status: Widowed    Tobacco Counseling Counseling given: Not Answered   Clinical Intake:  Pre-visit preparation completed: Yes  Pain : No/denies pain     BMI - recorded: 24.42 Nutritional Risks: None Diabetes: Yes CBG done?: Yes (per pt 109) CBG resulted in Enter/ Edit results?: No Did pt. bring in CBG monitor from home?: No  How often do you need to have someone help you when you read instructions, pamphlets, or other written materials from your doctor or pharmacy?: 1 - Never  Interpreter Needed?: No  Information entered by :: Lanier Ensign, LPN   Activities of Daily Living    01/29/2023    9:10 AM  In your present state of health, do you have any difficulty performing the following activities:  Hearing? 0  Vision? 0  Difficulty concentrating or making decisions? 0  Walking or climbing stairs? 0  Dressing or bathing? 0  Doing errands, shopping? 0  Preparing Food and eating ? N  Using the Toilet? N  In the past six months, have you accidently leaked urine? Y  Comment at times dribble  Do you have problems with loss of bowel control? N  Managing your Medications? N  Managing your Finances? N  Housekeeping or managing your  Housekeeping? N    Patient Care Team: Joaquim Nam, MD as PCP - General (Family Medicine) Berneice Heinrich Delbert Phenix.,  MD as Consulting Physician (Urology) Galen Manila, MD as Referring Physician (Ophthalmology) Mosetta Pigeon, MD (Nephrology)  Indicate any recent Medical Services you may have received from other than [facility] providers in the past year (date may be approximate).     Assessment:   This is a routine wellness examination for Adekunle.  Hearing/Vision screen Hearing Screening - Comments:: Pt denies any hearing issues  Vision Screening - Comments:: Pt follows up with [facility] for annual eye exams   Dietary issues and exercise activities discussed:     Goals Addressed             This Visit's Progress    Patient Stated       Work on lowering A1C        Depression Screen    02/02/2023    3:51 PM 11/03/2022    9:49 AM 08/05/2022    9:37 AM 02/01/2022   10:36 AM 11/20/2020   11:00 AM 02/07/2020    9:55 AM 01/04/2019   10:11 AM  PHQ 2/9 Scores  PHQ - 2 Score 0 0 0 0 0 0 0  PHQ- 9 Score  1 1   0 0    Fall Risk    01/29/2023    9:10 AM 11/03/2022    9:49 AM 08/05/2022    9:37 AM 02/01/2022   10:36 AM 02/04/2021    9:48 AM  Fall Risk   Falls in the past year? 0 1 1 1  0  Number falls in past yr: 0 0 0 1 0  Injury with Fall? 0 0 0 0 0  Risk for fall due to : Impaired vision No Fall Risks No Fall Risks History of fall(s) No Fall Risks  Follow up Falls prevention discussed Falls evaluation completed Falls evaluation completed Falls evaluation completed Falls evaluation completed    MEDICARE RISK AT HOME:   TIMED UP AND GO:  Was the test performed?  No    Cognitive Function:    02/07/2020    9:57 AM 01/04/2019   10:22 AM 01/01/2018    8:16 AM 01/07/2016    1:58 PM  MMSE - Mini Mental State Exam  Orientation to time 5 4 5 5   Orientation to Place 5 5 5 5   Registration 3 3 3 3   Attention/ Calculation 5 5 0 0  Recall 3 3 2 3   Recall-comments   unable to  recall 1 of 3 words   Language- name 2 objects  0 0 0  Language- repeat 1 1 1 1   Language- follow 3 step command  0 3 3  Language- read & follow direction  0 0 0  Write a sentence  0 0 0  Copy design  0 0 0  Total score  21 19 20         02/02/2023    3:53 PM  6CIT Screen  What Year? 0 points  What month? 0 points  What time? 0 points  Count back from 20 0 points  Months in reverse 0 points  Repeat phrase 0 points  Total Score 0 points    Immunizations Immunization History  Administered Date(s) Administered   COVID-19, mRNA, vaccine(Comirnaty)12 years and older 07/13/2022   Fluad Quad(high Dose 65+) 03/07/2019, 06/02/2022   Influenza Split 06/24/2011   Influenza Whole 05/09/2007, 04/27/2009   Influenza, High Dose Seasonal PF 07/10/2017, 04/06/2021   Influenza, Seasonal, Injecte, Preservative Fre 06/25/2012   Influenza,inj,Quad PF,6+ Mos 04/11/2018   Influenza-Unspecified 06/24/2014, 03/21/2015, 06/16/2016, 02/05/2020  PFIZER Comirnaty(Gray Top)Covid-19 Tri-Sucrose Vaccine 09/24/2020   PFIZER(Purple Top)SARS-COV-2 Vaccination 07/28/2019, 08/22/2019, 04/18/2020   Pfizer Covid-19 Vaccine Bivalent Booster 44yrs & up 04/06/2021   Pneumococcal Conjugate-13 09/09/2014   Pneumococcal Polysaccharide-23 05/20/1997, 06/24/2011   Td 03/12/2001, 06/24/2011   Zoster, Live 07/24/2006    TDAP status: Due, Education has been provided regarding the importance of this vaccine. Advised may receive this vaccine at local pharmacy or Health Dept. Aware to provide a copy of the vaccination record if obtained from local pharmacy or Health Dept. Verbalized acceptance and understanding.  Flu Vaccine status: Due, Education has been provided regarding the importance of this vaccine. Advised may receive this vaccine at local pharmacy or Health Dept. Aware to provide a copy of the vaccination record if obtained from local pharmacy or Health Dept. Verbalized acceptance and understanding.  Pneumococcal  vaccine status: Up to date  Covid-19 vaccine status: Information provided on how to obtain vaccines.   Qualifies for Shingles Vaccine? Yes   Zostavax completed No   Shingrix Completed?: No.    Education has been provided regarding the importance of this vaccine. Patient has been advised to call insurance company to determine out of pocket expense if they have not yet received this vaccine. Advised may also receive vaccine at local pharmacy or Health Dept. Verbalized acceptance and understanding.  Screening Tests Health Maintenance  Topic Date Due   Zoster Vaccines- Shingrix (1 of 2) 04/12/1965   DTaP/Tdap/Td (3 - Tdap) 06/23/2021   OPHTHALMOLOGY EXAM  08/31/2022   COVID-19 Vaccine (7 - 2023-24 season) 09/07/2022   INFLUENZA VACCINE  01/19/2023   FOOT EXAM  02/02/2023   HEMOGLOBIN A1C  08/03/2023   Diabetic kidney evaluation - eGFR measurement  01/31/2024   Diabetic kidney evaluation - Urine ACR  01/31/2024   Medicare Annual Wellness (AWV)  02/02/2024   Pneumonia Vaccine 46+ Years old  Completed   Hepatitis C Screening  Completed   HPV VACCINES  Aged Out   Colonoscopy  Discontinued    Health Maintenance  Health Maintenance Due  Topic Date Due   Zoster Vaccines- Shingrix (1 of 2) 04/12/1965   DTaP/Tdap/Td (3 - Tdap) 06/23/2021   OPHTHALMOLOGY EXAM  08/31/2022   COVID-19 Vaccine (7 - 2023-24 season) 09/07/2022   INFLUENZA VACCINE  01/19/2023   FOOT EXAM  02/02/2023    Colorectal cancer screening: Type of screening: Colonoscopy. Completed 07/05/22. Repeat every as directed  years  Additional Screening:  Hepatitis C Screening:  Completed 01/04/16  Vision Screening: Recommended annual ophthalmology exams for early detection of glaucoma and other disorders of the eye. Is the patient up to date with their annual eye exam?  Yes  Who is the provider or what is the name of the office in which the patient attends annual eye exams? Pt follows up with [facility]  If pt is not  established with a provider, would they like to be referred to a provider to establish care? No .   Dental Screening: Recommended annual dental exams for proper oral hygiene  Diabetic Foot Exam: Diabetic Foot Exam: Overdue, Pt has been advised about the importance in completing this exam. Pt is scheduled for diabetic foot exam on next appt .  Community Resource Referral / Chronic Care Management: CRR required this visit?  No   CCM required this visit?  No     Plan:     I have personally reviewed and noted the following in the patient's chart:   Medical and social history Use of  alcohol, tobacco or illicit drugs  Current medications and supplements including opioid prescriptions. Patient is not currently taking opioid prescriptions. Functional ability and status Nutritional status Physical activity Advanced directives List of other physicians Hospitalizations, surgeries, and ER visits in previous 12 months Vitals Screenings to include cognitive, depression, and falls Referrals and appointments  In addition, I have reviewed and discussed with patient certain preventive protocols, quality metrics, and best practice recommendations. A written personalized care plan for preventive services as well as general preventive health recommendations were provided to patient.     Marzella Schlein, LPN   [id number]   After Visit Summary: (MyChart) Due to this being a telephonic visit, the after visit summary with patients personalized plan was offered to patient via MyChart   Nurse Notes: none

## 2023-02-02 NOTE — Patient Instructions (Addendum)
Guy [name] , Thank you for taking time to come for your Medicare Wellness Visit. I appreciate your ongoing commitment to your health goals. Please review the following plan we discussed and let me know if I can assist you in the future.   Referrals/Orders/Follow-Ups/Clinician Recommendations: lowering a!c , Requested diabetic information from [facility]   This is a list of the screening recommended for you and due dates:  Health Maintenance  Topic Date Due   Zoster (Shingles) Vaccine (1 of 2) [birth year]/1965   DTaP/Tdap/Td vaccine (3 - Tdap) 06/23/2021   Eye exam for diabetics  08/31/2022   COVID-19 Vaccine (7 - 2023-24 season) 09/07/2022   Medicare Annual Wellness Visit  02/02/2023   Flu Shot  01/19/2023   Complete foot exam   02/02/2023   Hemoglobin A1C  08/03/2023   Yearly kidney function blood test for diabetes  01/31/2024   Yearly kidney health urinalysis for diabetes  01/31/2024   Pneumonia Vaccine  Completed   Hepatitis C Screening  Completed   HPV Vaccine  Aged Out   Colon Cancer Screening  Discontinued    Advanced directives: (In Chart) A copy of your advanced directives are scanned into your chart should your provider ever need it.  Next Medicare Annual Wellness Visit scheduled for next year: Yes  Preventive Care 77 Years and Older, Male  Preventive care refers to lifestyle choices and visits with your health care provider that can promote health and wellness. What does preventive care include? A yearly physical exam. This is also called an annual well check. Dental exams once or twice a year. Routine eye exams. Ask your health care provider how often you should have your eyes checked. Personal lifestyle choices, including: Daily care of your teeth and gums. Regular physical activity. Eating a healthy diet. Avoiding tobacco and drug use. Limiting alcohol use. Practicing safe sex. Taking low doses of aspirin every day. Taking vitamin and mineral supplements as  recommended by your health care provider. What happens during an annual well check? The services and screenings done by your health care provider during your annual well check will depend on your age, overall health, lifestyle risk factors, and family history of disease. Counseling  Your health care provider may ask you questions about your: Alcohol use. Tobacco use. Drug use. Emotional well-being. Home and relationship well-being. Sexual activity. Eating habits. History of falls. Memory and ability to understand (cognition). Work and work Astronomer. Screening  You may have the following tests or measurements: Height, weight, and BMI. Blood pressure. Lipid and cholesterol levels. These may be checked every 5 years, or more frequently if you are over 77 years old. Skin check. Lung cancer screening. You may have this screening every year starting at age 77 if you have a 30-pack-year history of smoking and currently smoke or have quit within the past 15 years. Fecal occult blood test (FOBT) of the stool. You may have this test every year starting at age 77. Flexible sigmoidoscopy or colonoscopy. You may have a sigmoidoscopy every 5 years or a colonoscopy every 10 years starting at age 77. Prostate cancer screening. Recommendations will vary depending on your family history and other risks. Hepatitis C blood test. Hepatitis B blood test. Sexually transmitted disease (STD) testing. Diabetes screening. This is done by checking your blood sugar (glucose) after you have not eaten for a while (fasting). You may have this done every 1-3 years. Abdominal aortic aneurysm (AAA) screening. You may need this if you are a current  or former smoker. Osteoporosis. You may be screened starting at age 77 if you are at high risk. Talk with your health care provider about your test results, treatment options, and if necessary, the need for more tests. Vaccines  Your health care provider may recommend  certain vaccines, such as: Influenza vaccine. This is recommended every year. Tetanus, diphtheria, and acellular pertussis (Tdap, Td) vaccine. You may need a Td booster every 10 years. Zoster vaccine. You may need this after age 77. Pneumococcal 13-valent conjugate (PCV13) vaccine. One dose is recommended after age 77. Pneumococcal polysaccharide (PPSV23) vaccine. One dose is recommended after age 50. Talk to your health care provider about which screenings and vaccines you need and how often you need them. This information is not intended to replace advice given to you by your health care provider. Make sure you discuss any questions you have with your health care provider. Document Released: 07/03/2015 Document Revised: 02/24/2016 Document Reviewed: 04/07/2015 Elsevier Interactive Patient Education  2017 [employer].  Fall Prevention in the Home Falls can cause injuries. They can happen to people of all ages. There are many things you can do to make your home safe and to help prevent falls. What can I do on the outside of my home? Regularly fix the edges of walkways and driveways and fix any cracks. Remove anything that might make you trip as you walk through a door, such as a raised step or threshold. Trim any bushes or trees on the path to your home. Use bright outdoor lighting. Clear any walking paths of anything that might make someone trip, such as rocks or tools. Regularly check to see if handrails are loose or broken. Make sure that both sides of any steps have handrails. Any raised decks and porches should have guardrails on the edges. Have any leaves, snow, or ice cleared regularly. Use sand or salt on walking paths during winter. Clean up any spills in your garage right away. This includes oil or grease spills. What can I do in the bathroom? Use night lights. Install grab bars by the toilet and in the tub and shower. Do not use towel bars as grab bars. Use non-skid mats or  decals in the tub or shower. If you need to sit down in the shower, use a plastic, non-slip stool. Keep the floor dry. Clean up any water that spills on the floor as soon as it happens. Remove soap buildup in the tub or shower regularly. Attach bath mats securely with double-sided non-slip rug tape. Do not have throw rugs and other things on the floor that can make you trip. What can I do in the bedroom? Use night lights. Make sure that you have a light by your bed that is easy to reach. Do not use any sheets or blankets that are too big for your bed. They should not hang down onto the floor. Have a firm chair that has side arms. You can use this for support while you get dressed. Do not have throw rugs and other things on the floor that can make you trip. What can I do in the kitchen? Clean up any spills right away. Avoid walking on wet floors. Keep items that you use a lot in easy-to-reach places. If you need to reach something above you, use a strong step stool that has a grab bar. Keep electrical cords out of the way. Do not use floor polish or wax that makes floors slippery. If you must use wax,  use non-skid floor wax. Do not have throw rugs and other things on the floor that can make you trip. What can I do with my stairs? Do not leave any items on the stairs. Make sure that there are handrails on both sides of the stairs and use them. Fix handrails that are broken or loose. Make sure that handrails are as long as the stairways. Check any carpeting to make sure that it is firmly attached to the stairs. Fix any carpet that is loose or worn. Avoid having throw rugs at the top or bottom of the stairs. If you do have throw rugs, attach them to the floor with carpet tape. Make sure that you have a light switch at the top of the stairs and the bottom of the stairs. If you do not have them, ask someone to add them for you. What else can I do to help prevent falls? Wear shoes that: Do not  have high heels. Have rubber bottoms. Are comfortable and fit you well. Are closed at the toe. Do not wear sandals. If you use a stepladder: Make sure that it is fully opened. Do not climb a closed stepladder. Make sure that both sides of the stepladder are locked into place. Ask someone to hold it for you, if possible. Clearly mark and make sure that you can see: Any grab bars or handrails. First and last steps. Where the edge of each step is. Use tools that help you move around (mobility aids) if they are needed. These include: Canes. Walkers. Scooters. Crutches. Turn on the lights when you go into a dark area. Replace any light bulbs as soon as they burn out. Set up your furniture so you have a clear path. Avoid moving your furniture around. If any of your floors are uneven, fix them. If there are any pets around you, be aware of where they are. Review your medicines with your doctor. Some medicines can make you feel dizzy. This can increase your chance of falling. Ask your doctor what other things that you can do to help prevent falls. This information is not intended to replace advice given to you by your health care provider. Make sure you discuss any questions you have with your health care provider. Document Released: 04/02/2009 Document Revised: 11/12/2015 Document Reviewed: 07/11/2014 Elsevier Interactive Patient Education  2017 [employer].

## 2023-02-03 ENCOUNTER — Encounter: Payer: Self-pay | Admitting: Family Medicine

## 2023-02-03 ENCOUNTER — Ambulatory Visit: Payer: Medicare Other | Admitting: Family Medicine

## 2023-02-03 VITALS — BP 118/80 | HR 79 | Temp 98.1°F | Ht 65.5 in | Wt 147.0 lb

## 2023-02-03 DIAGNOSIS — Z0000 Encounter for general adult medical examination without abnormal findings: Secondary | ICD-10-CM

## 2023-02-03 DIAGNOSIS — E119 Type 2 diabetes mellitus without complications: Secondary | ICD-10-CM

## 2023-02-03 DIAGNOSIS — N189 Chronic kidney disease, unspecified: Secondary | ICD-10-CM

## 2023-02-03 DIAGNOSIS — E785 Hyperlipidemia, unspecified: Secondary | ICD-10-CM

## 2023-02-03 DIAGNOSIS — Z7189 Other specified counseling: Secondary | ICD-10-CM

## 2023-02-03 NOTE — Patient Instructions (Addendum)
I would get a flu shot each fall.   Tdap when possible.  Check with pharmacy about RSV vaccine.   Don't change your meds for now.

## 2023-02-03 NOTE — Progress Notes (Unsigned)
Diabetes:  No meds Hypoglycemic episodes: no Hyperglycemic episodes:no Feet problems:no Blood Sugars averaging: 100-125 eye exam within last year: yes, 12/02/22 Labs d/w pt  Elevated Cholesterol: Using medications without problems:yes Muscle aches: no Diet compliance: yes Exercise: yes  CKD.  Recheck K wnl.  He took lokelma.  D/w pt.  He is going to see renal routinely.  Creatinine stable.  Discussed with patient.  He had prev back pain prior to eval at [facility].  He had back spasms.  He had aches and nausea, hand knee and joint pain that was atypical.  Then symptoms improved. He clearly feels better in the meantime.  Unclear if he had a benign systemic viral illness that was contributing to all of those symptoms.  Flu shot due this fall Shingles 2008 PNA prev done. Tetanus 2013 Covid prev done.  RSV d/w pt.   Colonoscopy 2023 PSA zero.   Living will d/w pt.  Would have Lesleigh Noe designated if patient were incapacitated.    PMH and SH reviewed  Meds, vitals, and allergies reviewed.   ROS: Per HPI unless specifically indicated in ROS section   GEN: nad, alert and oriented HEENT: ncat NECK: supple w/o LA CV: rrr. PULM: ctab, no inc wob ABD: soft, +bs EXT: no edema SKIN: no acute rash  Diabetic foot exam: Normal inspection No skin breakdown No calluses  Normal DP pulses Normal sensation to light touch and monofilament Nails normal

## 2023-02-05 NOTE — Assessment & Plan Note (Signed)
Flu shot due this fall Shingles 2008 PNA prev done. Tetanus 2013 Covid prev done.  RSV d/w pt.   Colonoscopy 2023 PSA zero.   Living will d/w pt.  Would have Lesleigh Noe designated if patient were incapacitated.

## 2023-02-05 NOTE — Assessment & Plan Note (Signed)
Living will d/w pt. Would have Guy [name] designated if patient were incapacitated.

## 2023-02-05 NOTE — Assessment & Plan Note (Signed)
Continue pravastatin 

## 2023-02-05 NOTE — Assessment & Plan Note (Signed)
Creatinine stable.  Recheck potassium normal.  He is going to follow-up with a renal.  No change in medications.

## 2023-02-05 NOTE — Assessment & Plan Note (Signed)
A1c stable.  Continue losartan and pravastatin.  Recheck periodically.

## 2023-02-08 DIAGNOSIS — H353211 Exudative age-related macular degeneration, right eye, with active choroidal neovascularization: Secondary | ICD-10-CM | POA: Diagnosis not present

## 2023-03-01 ENCOUNTER — Ambulatory Visit (INDEPENDENT_AMBULATORY_CARE_PROVIDER_SITE_OTHER): Payer: Medicare Other | Admitting: Dermatology

## 2023-03-01 ENCOUNTER — Encounter: Payer: Self-pay | Admitting: Dermatology

## 2023-03-01 VITALS — BP 111/66

## 2023-03-01 DIAGNOSIS — Z85828 Personal history of other malignant neoplasm of skin: Secondary | ICD-10-CM | POA: Diagnosis not present

## 2023-03-01 DIAGNOSIS — L570 Actinic keratosis: Secondary | ICD-10-CM

## 2023-03-01 DIAGNOSIS — W908XXA Exposure to other nonionizing radiation, initial encounter: Secondary | ICD-10-CM

## 2023-03-01 DIAGNOSIS — L578 Other skin changes due to chronic exposure to nonionizing radiation: Secondary | ICD-10-CM

## 2023-03-01 DIAGNOSIS — L814 Other melanin hyperpigmentation: Secondary | ICD-10-CM

## 2023-03-01 DIAGNOSIS — Z1283 Encounter for screening for malignant neoplasm of skin: Secondary | ICD-10-CM | POA: Diagnosis not present

## 2023-03-01 DIAGNOSIS — L821 Other seborrheic keratosis: Secondary | ICD-10-CM | POA: Diagnosis not present

## 2023-03-01 DIAGNOSIS — D229 Melanocytic nevi, unspecified: Secondary | ICD-10-CM

## 2023-03-01 DIAGNOSIS — D1801 Hemangioma of skin and subcutaneous tissue: Secondary | ICD-10-CM | POA: Diagnosis not present

## 2023-03-01 NOTE — Progress Notes (Signed)
   Follow-Up Visit   Subjective  Guy [name] is a 77 y.o. male who presents for the following: Skin Cancer Screening and Full Body Skin Exam - History of BCC. History of AK treated with LN2 and 5FU cream  The patient presents for Total-Body Skin Exam (TBSE) for skin cancer screening and mole check. The patient has spots, moles and lesions to be evaluated, some may be new or changing and the patient may have concern these could be cancer.    The following portions of the chart were reviewed this encounter and updated as appropriate: medications, allergies, medical history  Review of Systems:  No other skin or systemic complaints except as noted in HPI or Assessment and Plan.  Objective  Well appearing patient in no apparent distress; mood and affect are within normal limits.  A full examination was performed including scalp, head, eyes, ears, nose, lips, neck, chest, axillae, abdomen, back, buttocks, bilateral upper extremities, bilateral lower extremities, hands, feet, fingers, toes, fingernails, and toenails. All findings within normal limits unless otherwise noted below.   Relevant physical exam findings are noted in the Assessment and Plan.  Right cheek (2) Erythematous thin papules/macules with gritty scale.     Assessment & Plan   SKIN CANCER SCREENING PERFORMED TODAY.  ACTINIC DAMAGE - Chronic condition, secondary to cumulative UV/sun exposure - diffuse scaly erythematous macules with underlying dyspigmentation - Recommend daily broad spectrum sunscreen SPF 30+ to sun-exposed areas, reapply every 2 hours as needed.  - Staying in the shade or wearing long sleeves, sun glasses (UVA+UVB protection) and wide brim hats (4-inch brim around the entire circumference of the hat) are also recommended for sun protection.  - Call for new or changing lesions.  LENTIGINES, SEBORRHEIC KERATOSES, HEMANGIOMAS - Benign normal skin lesions - Benign-appearing - Call for any  changes  MELANOCYTIC NEVI - Tan-brown and/or pink-flesh-colored symmetric macules and papules - Benign appearing on exam today - Observation - Call clinic for new or changing moles - Recommend daily use of broad spectrum spf 30+ sunscreen to sun-exposed areas.       AK (actinic keratosis) (2) Right cheek  Destruction of lesion - Right cheek (2) Complexity: simple   Destruction method: cryotherapy   Informed consent: discussed and consent obtained   Timeout:  patient name, date of birth, surgical site, and procedure verified Lesion destroyed using liquid nitrogen: Yes   Region frozen until ice ball extended beyond lesion: Yes   Outcome: patient tolerated procedure well with no complications   Post-procedure details: wound care instructions given     Return in about 1 year (around 02/29/2024) for TBSE.  I, Joanie Coddington, CMA, am acting as scribe for Armida Sans, MD .   Documentation: I have reviewed the above documentation for accuracy and completeness, and I agree with the above.  Armida Sans, MD

## 2023-03-01 NOTE — Patient Instructions (Signed)

## 2023-03-08 DIAGNOSIS — E1122 Type 2 diabetes mellitus with diabetic chronic kidney disease: Secondary | ICD-10-CM | POA: Diagnosis not present

## 2023-03-08 DIAGNOSIS — I10 Essential (primary) hypertension: Secondary | ICD-10-CM | POA: Diagnosis not present

## 2023-03-08 DIAGNOSIS — N1832 Chronic kidney disease, stage 3b: Secondary | ICD-10-CM | POA: Diagnosis not present

## 2023-03-08 DIAGNOSIS — I129 Hypertensive chronic kidney disease with stage 1 through stage 4 chronic kidney disease, or unspecified chronic kidney disease: Secondary | ICD-10-CM | POA: Diagnosis not present

## 2023-03-08 DIAGNOSIS — N2581 Secondary hyperparathyroidism of renal origin: Secondary | ICD-10-CM | POA: Diagnosis not present

## 2023-03-09 DIAGNOSIS — Z23 Encounter for immunization: Secondary | ICD-10-CM | POA: Diagnosis not present

## 2023-03-13 DIAGNOSIS — N1832 Chronic kidney disease, stage 3b: Secondary | ICD-10-CM | POA: Diagnosis not present

## 2023-03-13 DIAGNOSIS — I129 Hypertensive chronic kidney disease with stage 1 through stage 4 chronic kidney disease, or unspecified chronic kidney disease: Secondary | ICD-10-CM | POA: Diagnosis not present

## 2023-03-13 DIAGNOSIS — N2581 Secondary hyperparathyroidism of renal origin: Secondary | ICD-10-CM | POA: Diagnosis not present

## 2023-03-13 DIAGNOSIS — E1122 Type 2 diabetes mellitus with diabetic chronic kidney disease: Secondary | ICD-10-CM | POA: Diagnosis not present

## 2023-03-13 DIAGNOSIS — I10 Essential (primary) hypertension: Secondary | ICD-10-CM | POA: Diagnosis not present

## 2023-03-29 DIAGNOSIS — H353211 Exudative age-related macular degeneration, right eye, with active choroidal neovascularization: Secondary | ICD-10-CM | POA: Diagnosis not present

## 2023-04-04 DIAGNOSIS — C61 Malignant neoplasm of prostate: Secondary | ICD-10-CM | POA: Diagnosis not present

## 2023-04-07 NOTE — Telephone Encounter (Signed)
Patient called in and wanted to speak with you in regards to getting assistance once again for his records. He can be reached at [phone]. Thank you!

## 2023-04-11 DIAGNOSIS — N183 Chronic kidney disease, stage 3 unspecified: Secondary | ICD-10-CM | POA: Diagnosis not present

## 2023-04-11 DIAGNOSIS — C61 Malignant neoplasm of prostate: Secondary | ICD-10-CM | POA: Diagnosis not present

## 2023-04-11 DIAGNOSIS — N5201 Erectile dysfunction due to arterial insufficiency: Secondary | ICD-10-CM | POA: Diagnosis not present

## 2023-04-11 DIAGNOSIS — N393 Stress incontinence (female) (male): Secondary | ICD-10-CM | POA: Diagnosis not present

## 2023-05-01 DIAGNOSIS — H353211 Exudative age-related macular degeneration, right eye, with active choroidal neovascularization: Secondary | ICD-10-CM | POA: Diagnosis not present

## 2023-05-08 ENCOUNTER — Ambulatory Visit (INDEPENDENT_AMBULATORY_CARE_PROVIDER_SITE_OTHER): Payer: Medicare Other | Admitting: Family Medicine

## 2023-05-08 ENCOUNTER — Encounter: Payer: Self-pay | Admitting: Family Medicine

## 2023-05-08 VITALS — BP 104/68 | HR 84 | Temp 98.0°F | Ht 65.0 in | Wt 145.6 lb

## 2023-05-08 DIAGNOSIS — E119 Type 2 diabetes mellitus without complications: Secondary | ICD-10-CM | POA: Diagnosis not present

## 2023-05-08 DIAGNOSIS — N189 Chronic kidney disease, unspecified: Secondary | ICD-10-CM | POA: Diagnosis not present

## 2023-05-08 LAB — POCT GLYCOSYLATED HEMOGLOBIN (HGB A1C): Hemoglobin A1C: 6.6 % — AB (ref 4.0–5.6)

## 2023-05-08 MED ORDER — DAPAGLIFLOZIN PROPANEDIOL 5 MG PO TABS: 5.0000 mg | ORAL_TABLET | Freq: Every day | ORAL | 3 refills | Status: AC

## 2023-05-08 NOTE — Progress Notes (Unsigned)
Diabetes:  Using medications without difficulties:yes, taking farxiga.   Hypoglycemic episodes: no Hyperglycemic episodes: no Feet problems: no Blood Sugars averaging: usually 115-120, occ down to 90s.  eye exam within last year: yes A1c 6.6.  d/w pt at [facility].  He is working on diet.   D/w pt about using farxiga, esp cautions re: lows.  He has been on for about 3 weeks.  No ADE or lows with med use.    He is applying re: VA benefits.   Meds, vitals, and allergies reviewed.   ROS: Per HPI unless specifically indicated in ROS section   GEN: nad, alert and oriented HEENT: ncat NECK: supple w/o LA CV: rrr. PULM: ctab, no inc wob ABD: soft, +bs EXT: no edema SKIN: well perfused.

## 2023-05-08 NOTE — Patient Instructions (Addendum)
Keep going as is.  Update me as needed, especially if any lows.   Recheck in about 3 months.  A1c at the visit.  Take care.  Glad to see you.

## 2023-05-10 NOTE — Assessment & Plan Note (Signed)
A1c 6.6.  d/w pt at [facility].  He is working on diet.   D/w pt about using farxiga, esp cautions re: lows.  He has been on for about 3 weeks.  No ADE or lows with med use.   Keep going as is.  Update me as needed, especially if any lows.   Recheck in about 3 months.  A1c at the visit.   He has routine kidney clinic follow-up.

## 2023-05-18 ENCOUNTER — Other Ambulatory Visit: Payer: Self-pay | Admitting: Family Medicine

## 2023-05-28 ENCOUNTER — Other Ambulatory Visit: Payer: Self-pay | Admitting: Family Medicine

## 2023-06-05 ENCOUNTER — Other Ambulatory Visit: Payer: Self-pay | Admitting: Family Medicine

## 2023-06-26 DIAGNOSIS — H353211 Exudative age-related macular degeneration, right eye, with active choroidal neovascularization: Secondary | ICD-10-CM | POA: Diagnosis not present

## 2023-08-08 ENCOUNTER — Encounter: Payer: Self-pay | Admitting: Family Medicine

## 2023-08-08 ENCOUNTER — Ambulatory Visit (INDEPENDENT_AMBULATORY_CARE_PROVIDER_SITE_OTHER): Payer: Medicare Other | Admitting: Family Medicine

## 2023-08-08 VITALS — BP 116/62 | HR 69 | Temp 97.8°F | Ht 65.0 in | Wt 144.8 lb

## 2023-08-08 DIAGNOSIS — E119 Type 2 diabetes mellitus without complications: Secondary | ICD-10-CM | POA: Diagnosis not present

## 2023-08-08 DIAGNOSIS — Z7984 Long term (current) use of oral hypoglycemic drugs: Secondary | ICD-10-CM

## 2023-08-08 LAB — POCT GLYCOSYLATED HEMOGLOBIN (HGB A1C): Hemoglobin A1C: 6.8 % — AB (ref 4.0–5.6)

## 2023-08-08 MED ORDER — PRAVASTATIN SODIUM 10 MG PO TABS: 10.0000 mg | ORAL_TABLET | Freq: Every day | ORAL | Status: AC

## 2023-08-08 NOTE — Patient Instructions (Addendum)
Recheck in about 3 months.  A1c at the visit. You don't need to fast.  Take care.  Glad to see you. Thanks for your effort.

## 2023-08-08 NOTE — Progress Notes (Unsigned)
Diabetes:  Using medications without difficulties: yes Hypoglycemic episodes:no Hyperglycemic episodes:no Feet problems: no Blood Sugars averaging: 99-156.   eye exam within last year: yes A1c d/w pt at OV.   6.8.  He had higher sugars around Christmas. Overall A1c is reasonable.    He is still dealing with the [place].  He is waiting on an update from them.  He is still seeing the renal clinic at baseline.    Meds, vitals, and allergies reviewed.  ROS: Per HPI unless specifically indicated in ROS section   GEN: nad, alert and oriented HEENT: ncat NECK: supple w/o LA CV: rrr. PULM: ctab, no inc wob ABD: soft, +bs EXT: no edema SKIN: well perfused.

## 2023-08-09 NOTE — Assessment & Plan Note (Signed)
A1c d/w pt at OV.   6.8.  He had higher sugars around Christmas. Overall A1c is reasonable.   Continue farxiga.  Recheck in about 3 months.  A1c at the visit. You don't need to fast.

## 2023-09-04 DIAGNOSIS — H353211 Exudative age-related macular degeneration, right eye, with active choroidal neovascularization: Secondary | ICD-10-CM | POA: Diagnosis not present

## 2023-09-06 DIAGNOSIS — I129 Hypertensive chronic kidney disease with stage 1 through stage 4 chronic kidney disease, or unspecified chronic kidney disease: Secondary | ICD-10-CM | POA: Diagnosis not present

## 2023-09-06 DIAGNOSIS — E1122 Type 2 diabetes mellitus with diabetic chronic kidney disease: Secondary | ICD-10-CM | POA: Diagnosis not present

## 2023-09-06 DIAGNOSIS — N1832 Chronic kidney disease, stage 3b: Secondary | ICD-10-CM | POA: Diagnosis not present

## 2023-09-06 DIAGNOSIS — N2581 Secondary hyperparathyroidism of renal origin: Secondary | ICD-10-CM | POA: Diagnosis not present

## 2023-09-06 DIAGNOSIS — I10 Essential (primary) hypertension: Secondary | ICD-10-CM | POA: Diagnosis not present

## 2023-09-11 DIAGNOSIS — I129 Hypertensive chronic kidney disease with stage 1 through stage 4 chronic kidney disease, or unspecified chronic kidney disease: Secondary | ICD-10-CM | POA: Diagnosis not present

## 2023-09-11 DIAGNOSIS — I10 Essential (primary) hypertension: Secondary | ICD-10-CM | POA: Diagnosis not present

## 2023-09-11 DIAGNOSIS — E1122 Type 2 diabetes mellitus with diabetic chronic kidney disease: Secondary | ICD-10-CM | POA: Diagnosis not present

## 2023-09-11 DIAGNOSIS — N2581 Secondary hyperparathyroidism of renal origin: Secondary | ICD-10-CM | POA: Diagnosis not present

## 2023-09-28 DIAGNOSIS — E1122 Type 2 diabetes mellitus with diabetic chronic kidney disease: Secondary | ICD-10-CM | POA: Diagnosis not present

## 2023-09-28 DIAGNOSIS — I10 Essential (primary) hypertension: Secondary | ICD-10-CM | POA: Diagnosis not present

## 2023-09-28 DIAGNOSIS — I129 Hypertensive chronic kidney disease with stage 1 through stage 4 chronic kidney disease, or unspecified chronic kidney disease: Secondary | ICD-10-CM | POA: Diagnosis not present

## 2023-09-28 DIAGNOSIS — N2581 Secondary hyperparathyroidism of renal origin: Secondary | ICD-10-CM | POA: Diagnosis not present

## 2023-11-07 ENCOUNTER — Encounter: Payer: Self-pay | Admitting: Family Medicine

## 2023-11-07 ENCOUNTER — Ambulatory Visit (INDEPENDENT_AMBULATORY_CARE_PROVIDER_SITE_OTHER): Payer: Medicare Other | Admitting: Family Medicine

## 2023-11-07 VITALS — BP 110/76 | HR 77 | Temp 98.5°F | Ht 65.0 in | Wt 142.8 lb

## 2023-11-07 DIAGNOSIS — E119 Type 2 diabetes mellitus without complications: Secondary | ICD-10-CM | POA: Diagnosis not present

## 2023-11-07 DIAGNOSIS — M25569 Pain in unspecified knee: Secondary | ICD-10-CM

## 2023-11-07 DIAGNOSIS — Z7984 Long term (current) use of oral hypoglycemic drugs: Secondary | ICD-10-CM | POA: Diagnosis not present

## 2023-11-07 LAB — POCT GLYCOSYLATED HEMOGLOBIN (HGB A1C): Hemoglobin A1C: 6.4 % — AB (ref 4.0–5.6)

## 2023-11-07 NOTE — Progress Notes (Signed)
 Diabetes:  Using medications without difficulties: yes Hypoglycemic episodes:no Hyperglycemic episodes:no Feet problems: he had a callous and he is using a pad of that, foot care d/w pt.  That pad is helping.  Blood Sugars averaging: usually ~108 as a 7 day average recently.   eye exam within last year: yes A1c 6.4.  Improved from prior.    He got 80% from the [place], d/w pt.  He has a primary at the [place] now.  He is still going to come here for care.    We talked about Empagliflozin vs dapagliflozin  use.  Either would be okay, d/w pt.    He is walking for exercise, paying attention to diet.    Stepped off a curb about 3 months ago, with some episodic R knee pain with crepitus.  No bruising.    Meds, vitals, and allergies reviewed.  ROS: Per HPI unless specifically indicated in ROS section   GEN: nad, alert and oriented HEENT: ncat NECK: supple w/o LA CV: rrr. PULM: ctab, no inc wob ABD: soft, +bs EXT: no edema SKIN: Well-perfused. Right knee with normal inspection.  ACL MCL and LCL feel solid. Medial R knee superior patellar discomfort on palpation without bruising or erythema..   Diabetic foot exam: Normal inspection No skin breakdown Small R 5th ray distal callus w/o ulceration.   Normal DP pulses Normal sensation to light touch and monofilament Nails normal

## 2023-11-07 NOTE — Patient Instructions (Addendum)
 Recheck in about 3 months with me at a yearly visit, about 1 month after the renal visit.   Labs before the visit if possible.   Take care.  Glad to see you. I would ice your knee for 5 minutes at a time and try a knee sleeve.

## 2023-11-08 DIAGNOSIS — M25569 Pain in unspecified knee: Secondary | ICD-10-CM | POA: Insufficient documentation

## 2023-11-08 NOTE — Assessment & Plan Note (Addendum)
 A1c 6.4.  Improved from prior.   We talked about Empagliflozin vs dapagliflozin  use.  Either would be okay, d/w pt.   Would continue as is for now.  Continue work on diet and exercise.  Recheck periodically.  I thanked him for his effort.  Footcare discussed with patient.

## 2023-11-08 NOTE — Assessment & Plan Note (Signed)
 I do not suspect a fracture.  I think it makes sense to try icing and using a knee sleeve given his need to avoid NSAIDs.  He can update me as needed.  He agrees to plan.

## 2023-11-27 DIAGNOSIS — H353211 Exudative age-related macular degeneration, right eye, with active choroidal neovascularization: Secondary | ICD-10-CM | POA: Diagnosis not present

## 2023-11-27 LAB — HM DIABETES EYE EXAM

## 2024-01-10 DIAGNOSIS — E1122 Type 2 diabetes mellitus with diabetic chronic kidney disease: Secondary | ICD-10-CM | POA: Diagnosis not present

## 2024-01-10 DIAGNOSIS — N2581 Secondary hyperparathyroidism of renal origin: Secondary | ICD-10-CM | POA: Diagnosis not present

## 2024-01-10 DIAGNOSIS — I129 Hypertensive chronic kidney disease with stage 1 through stage 4 chronic kidney disease, or unspecified chronic kidney disease: Secondary | ICD-10-CM | POA: Diagnosis not present

## 2024-01-10 DIAGNOSIS — I10 Essential (primary) hypertension: Secondary | ICD-10-CM | POA: Diagnosis not present

## 2024-01-15 DIAGNOSIS — E875 Hyperkalemia: Secondary | ICD-10-CM | POA: Diagnosis not present

## 2024-01-15 DIAGNOSIS — E1122 Type 2 diabetes mellitus with diabetic chronic kidney disease: Secondary | ICD-10-CM | POA: Diagnosis not present

## 2024-01-15 DIAGNOSIS — I10 Essential (primary) hypertension: Secondary | ICD-10-CM | POA: Diagnosis not present

## 2024-01-15 DIAGNOSIS — I129 Hypertensive chronic kidney disease with stage 1 through stage 4 chronic kidney disease, or unspecified chronic kidney disease: Secondary | ICD-10-CM | POA: Diagnosis not present

## 2024-01-15 DIAGNOSIS — N2581 Secondary hyperparathyroidism of renal origin: Secondary | ICD-10-CM | POA: Diagnosis not present

## 2024-01-24 DIAGNOSIS — I10 Essential (primary) hypertension: Secondary | ICD-10-CM | POA: Diagnosis not present

## 2024-01-24 DIAGNOSIS — I129 Hypertensive chronic kidney disease with stage 1 through stage 4 chronic kidney disease, or unspecified chronic kidney disease: Secondary | ICD-10-CM | POA: Diagnosis not present

## 2024-01-24 DIAGNOSIS — E1122 Type 2 diabetes mellitus with diabetic chronic kidney disease: Secondary | ICD-10-CM | POA: Diagnosis not present

## 2024-01-24 DIAGNOSIS — N2581 Secondary hyperparathyroidism of renal origin: Secondary | ICD-10-CM | POA: Diagnosis not present

## 2024-01-24 DIAGNOSIS — E875 Hyperkalemia: Secondary | ICD-10-CM | POA: Diagnosis not present

## 2024-01-24 DIAGNOSIS — N1832 Chronic kidney disease, stage 3b: Secondary | ICD-10-CM | POA: Diagnosis not present

## 2024-02-05 ENCOUNTER — Other Ambulatory Visit: Payer: Self-pay | Admitting: Family Medicine

## 2024-02-05 DIAGNOSIS — E1122 Type 2 diabetes mellitus with diabetic chronic kidney disease: Secondary | ICD-10-CM | POA: Insufficient documentation

## 2024-02-05 DIAGNOSIS — N2581 Secondary hyperparathyroidism of renal origin: Secondary | ICD-10-CM | POA: Diagnosis not present

## 2024-02-05 DIAGNOSIS — N1832 Chronic kidney disease, stage 3b: Secondary | ICD-10-CM | POA: Diagnosis not present

## 2024-02-05 DIAGNOSIS — Z125 Encounter for screening for malignant neoplasm of prostate: Secondary | ICD-10-CM

## 2024-02-05 DIAGNOSIS — I129 Hypertensive chronic kidney disease with stage 1 through stage 4 chronic kidney disease, or unspecified chronic kidney disease: Secondary | ICD-10-CM | POA: Diagnosis not present

## 2024-02-05 DIAGNOSIS — I10 Essential (primary) hypertension: Secondary | ICD-10-CM | POA: Diagnosis not present

## 2024-02-05 LAB — BASIC METABOLIC PANEL WITH GFR: Creatinine: 2.3 — AB (ref 0.6–1.3)

## 2024-02-05 LAB — COMPREHENSIVE METABOLIC PANEL WITH GFR: Albumin: 4.6 (ref 3.5–5.0)

## 2024-02-06 ENCOUNTER — Ambulatory Visit (INDEPENDENT_AMBULATORY_CARE_PROVIDER_SITE_OTHER)

## 2024-02-06 VITALS — BP 111/63 | Ht 65.0 in | Wt 146.0 lb

## 2024-02-06 DIAGNOSIS — Z0000 Encounter for general adult medical examination without abnormal findings: Secondary | ICD-10-CM

## 2024-02-06 NOTE — Patient Instructions (Signed)
 Guy [name] , Thank you for taking time out of your busy schedule to complete your Annual Wellness Visit with me. I enjoyed our conversation and look forward to speaking with you again next year. I, as well as your care team,  appreciate your ongoing commitment to your health goals. Please review the following plan we discussed and let me know if I can assist you in the future. Your Game plan/ To Do List    Referrals: If you haven't heard from the office you've been referred to, please reach out to them at the phone provided.   Follow up Visits: We will see or speak with you next year for your Next Medicare AWV with our clinical staff Have you seen your provider in the last 6 months (3 months if uncontrolled diabetes)? Yes  Clinician Recommendations:  Aim for 30 minutes of exercise or brisk walking, 6-8 glasses of water, and 5 servings of fruits and vegetables each day.       This is a list of the screenings recommended for you:  Health Maintenance  Topic Date Due   DTaP/Tdap/Td vaccine (3 - Tdap) 06/23/2021   Yearly kidney health urinalysis for diabetes  03/03/2023   COVID-19 Vaccine (8 - Pfizer risk 2024-25 season) 10/07/2023   Eye exam for diabetics  12/02/2023   Yearly kidney function blood test for diabetes  01/31/2024   Flu Shot  01/19/2024   Hemoglobin A1C  05/09/2024   Complete foot exam   11/06/2024   Medicare Annual Wellness Visit  02/05/2025   Pneumococcal Vaccine for age over 26  Completed   Hepatitis C Screening  Completed   Zoster (Shingles) Vaccine  Completed   HPV Vaccine  Aged Out   Meningitis B Vaccine  Aged Out   Pneumococcal Vaccine  Discontinued   Colon Cancer Screening  Discontinued    Advanced directives: (In Chart) A copy of your advanced directives are scanned into your chart should your provider ever need it. Advance Care Planning is important because it:  [x]  Makes sure you receive the medical care that is consistent with your values, goals, and  preferences  [x]  It provides guidance to your family and loved ones and reduces their decisional burden about whether or not they are making the right decisions based on your wishes.  Follow the link provided in your after visit summary or read over the paperwork we have mailed to you to help you started getting your Advance Directives in place. If you need assistance in completing these, please reach out to us  so that we can help you!  See attachments for Preventive Care and Fall Prevention Tips.

## 2024-02-06 NOTE — Progress Notes (Signed)
 Because this visit was a virtual/telehealth visit,  certain criteria was not obtained, such a blood pressure, CBG if applicable, and timed get up and go. Any medications not marked as taking were not mentioned during the medication reconciliation part of the visit. Any vitals not documented were not able to be obtained due to this being a telehealth visit or patient was unable to self-report a recent blood pressure reading due to a lack of equipment at home via telehealth. Vitals that have been documented are verbally provided by the patient.  This visit was performed by a medical professional under my direct supervision. I was immediately available for consultation/collaboration. I have reviewed and agree with the Annual Wellness Visit documentation.  Subjective:   Guy [name] is a 78 y.o. who presents for a Medicare Wellness preventive visit.  As a reminder, Annual Wellness Visits don't include a physical exam, and some assessments may be limited, especially if this visit is performed virtually. We may recommend an in-person follow-up visit with your provider if needed.  Visit Complete: Virtual I connected with  Guy [name] on 02/06/24 by a audio enabled telemedicine application and verified that I am speaking with the correct person using two identifiers.  Patient Location: Home  Provider Location: Home Office  I discussed the limitations of evaluation and management by telemedicine. The patient expressed understanding and agreed to proceed.  Vital Signs: Because this visit was a virtual/telehealth visit, some criteria may be missing or patient reported. Any vitals not documented were not able to be obtained and vitals that have been documented are patient reported.  VideoDeclined- This patient declined Librarian, academic. Therefore the visit was completed with audio only.  Persons Participating in Visit: Patient.  AWV Questionnaire: Yes: Patient Medicare  AWV questionnaire was completed by the patient on 02/02/2024; I have confirmed that all information answered by patient is correct and no changes since this date.  Cardiac Risk Factors include: male gender;advanced age (>40men, >55 women);diabetes mellitus;hypertension     Objective:    Today's Vitals   02/06/24 1315  BP: 111/63  Weight: 146 lb (66.2 kg)  Height: 5' 5 (1.651 m)   Body mass index is 24.3 kg/m.     02/06/2024    1:22 PM 02/02/2023    3:51 PM 01/11/2022    7:13 AM 12/28/2021    8:25 AM 11/20/2020   11:00 AM 02/07/2020    9:51 AM 01/04/2019   10:10 AM  Advanced Directives  Does Patient Have a Medical Advance Directive? No Yes Yes Yes Yes Yes Yes  Type of Furniture conservator/restorer;Living will Healthcare Power of [place];Living will Healthcare Power of [place];Living will Healthcare Power of [place];Living will Healthcare Power of [place];Living will Healthcare Power of [place];Living will  Does patient want to make changes to medical advance directive?  No - Patient declined No - Patient declined No - Patient declined No - Patient declined  No - Patient declined   Copy of Healthcare Power of Attorney in Chart?  Yes - validated most recent copy scanned in chart (See row information) No - copy requested No - copy requested  Yes - validated most recent copy scanned in chart (See row information) No - copy requested   Would patient like information on creating a medical advance directive? No - Patient declined           Data saved with a previous flowsheet row definition    Current Medications (verified)  Outpatient Encounter Medications as of 02/06/2024  Medication Sig   acetaminophen  (TYLENOL ) 500 MG tablet Take 1,000 mg by mouth 2 (two) times daily.    Blood Glucose Monitoring Suppl (ONETOUCH VERIO) w/Device KIT 1 kit by Other route daily. Use as instructed to check blood sugar once daily and as needed.  Diagnosis:  E11.9  Non insulin  dependent.    Cholecalciferol (VITAMIN D3) 50 MCG (2000 UT) capsule Take 1 capsule (2,000 Units total) by mouth daily.   dapagliflozin  propanediol (FARXIGA ) 5 MG TABS tablet Take 1 tablet (5 mg total) by mouth daily.   diclofenac sodium (VOLTAREN) 1 % GEL Apply topically 4 (four) times daily as needed.   famotidine  (PEPCID ) 10 MG tablet Take 10 mg by mouth daily.   fluorouracil  (EFUDEX ) 5 % cream Apply to aa's R cheek x 2, L temple x 1 BID x 7 days.   glucose blood (ONETOUCH VERIO) test strip CHECK BLOOD SUGAR 3 TIMES A DAY AS DIRECTED   Lancets (ONETOUCH DELICA PLUS LANCET33G) MISC USE TO CHECK BLOOD SUGAR DAILY. DX E11.9.   losartan  (COZAAR ) 25 MG tablet Take 0.5 tablets (12.5 mg total) by mouth daily.   Multiple Vitamins-Minerals (PRESERVISION AREDS) TABS Take 1 tablet by mouth in the morning and at bedtime.   pravastatin  (PRAVACHOL ) 10 MG tablet Take 1 tablet (10 mg total) by mouth daily.   No facility-administered encounter medications on file as of 02/06/2024.    Allergies (verified) Melatonin, Nsaids, and Pravastatin    History: Past Medical History:  Diagnosis Date   Actinic keratosis    Anxiety    not clinical   Arthritis    Basal cell carcinoma    Right nasal ala, right temple, nasal dorsum   Cataract    Chronic kidney disease    Stage 3   Diabetes mellitus type II    Diet controlled   Difficult intubation    Noted 02/16/18. Pt unaware of any issue - 12/16/21   Elevated PSA    GERD (gastroesophageal reflux disease)    History of basal cell carcinoma excision    1990's--  moh's surg. of nose   History of hiatal hernia    Neck complaint    stiffness, catches-denies numbness, tingling of arms   Prostate cancer (HCC) 2016   Past Surgical History:  Procedure Laterality Date   CATARACT EXTRACTION W/PHACO Right 12/28/2021   Procedure: CATARACT EXTRACTION PHACO AND INTRAOCULAR LENS PLACEMENT (IOC) RIGHT DIABETIC 6.34 00:50.8;  Surgeon: Jaye Fallow, MD;  Location: [facility];  Service: Ophthalmology;  Laterality: Right;   CATARACT EXTRACTION W/PHACO Left 01/11/2022   Procedure: CATARACT EXTRACTION PHACO AND INTRAOCULAR LENS PLACEMENT (IOC) LEFT DIABETIC 7.19 00:47.6;  Surgeon: Jaye Fallow, MD;  Location: [facility];  Service: Ophthalmology;  Laterality: Left;   CHOLECYSTECTOMY N/A 02/16/2018   Procedure: LAPAROSCOPIC CHOLECYSTECTOMY;  Surgeon: Nicholaus Selinda Birmingham, MD;  Location: [facility] ORS;  Service: General;  Laterality: N/A;   COLONOSCOPY  06/01/2011   COLONOSCOPY WITH PROPOFOL  N/A 12/06/2016   Procedure: COLONOSCOPY WITH PROPOFOL ;  Surgeon: Jinny Carmine, MD;  Location: [facility] ENDOSCOPY;  Service: Endoscopy;  Laterality: N/A;   COLONOSCOPY WITH PROPOFOL  N/A 04/14/2022   Procedure: COLONOSCOPY WITH PROPOFOL ;  Surgeon: Jinny Carmine, MD;  Location: [facility] ENDOSCOPY;  Service: Endoscopy;  Laterality: N/A;   ESOPHAGOGASTRODUODENOSCOPY  07/29/2005   [facility]   JOINT REPLACEMENT     LYMPHADENECTOMY N/A 05/06/2015   Procedure: LYMPH NODE DISSECTION;  Surgeon: Ricardo Likens, MD;  Location: [facility];  Service: Urology;  Laterality: N/A;   MOHS SURGERY  1990's   nose   PROSTATE BIOPSY  2014   PROSTATE BIOPSY N/A 03/06/2015   Procedure: SATURATION BIOPSY TRANSRECTAL ULTRASONIC PROSTATE (TUBP);  Surgeon: Ricardo Likens, MD;  Location[facility];  Service: Urology;  Laterality: N/A;   ROBOT ASSISTED LAPAROSCOPIC RADICAL PROSTATECTOMY N/A 05/06/2015   Procedure: ROBOTIC ASSISTED LAPAROSCOPIC RADICAL PROSTATECTOMY;  Surgeon: Ricardo Likens, MD;  Location: [facility];  Service: Urology;  Laterality: N/A;   TONSILLECTOMY  as a child   TOTAL HIP ARTHROPLASTY Right 01/1999   Family History  Problem Relation Age of Onset   COPD Mother        smoker   Cancer Mother        unknown primary   Arthritis Mother    Alzheimer's disease Father    Hypertension Father    Diabetes Father    Obesity Brother    Hypertension Brother    Diabetes Brother     Prostate cancer Brother    Heart disease Brother        heart valve   Colon cancer Neg Hx    Social History   Socioeconomic History   Marital status: Widowed    Spouse name: Not on file   Number of children: 1   Years of education: Not on file   Highest education level: 12th grade  Occupational History   Occupation: Retired 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     Married 1980, widowed 2014   1 step daughter    Herbalist   Retired from [employer]   Social Drivers of [employer]: Low Risk  (02/02/2024)   Overall Financial Resource Strain (CARDIA)    Difficulty of Paying Living Expenses: Not hard at all  Food Insecurity: No Food Insecurity (02/02/2024)   Hunger Vital Sign    Worried About Running Out of Food in the Last Year: Never true    Ran Out of Food in the Last Year: Never true  Transportation Needs: No Transportation Needs (02/06/2024)   PRAPARE - Administrator, Civil Service (Medical): No    Lack of Transportation (Non-Medical): No  Physical Activity: Sufficiently Active (02/02/2024)   Exercise Vital Sign    Days of Exercise per Week: 4 days    Minutes of Exercise per Session: 40 min  Stress: Stress Concern Present (02/02/2024)   [employer] of Occupational Health - Occupational Stress Questionnaire    Feeling of Stress: To some extent  Social Connections: Moderately Isolated (02/02/2024)   Social Connection and Isolation Panel    Frequency of Communication with Friends and Family: More than three  times a week    Frequency of Social Gatherings with Friends and Family: More than three times a week    Attends Religious Services: 1 to 4 times per year    Active Member of [employer] or Organizations: No    Attends Club or  Organization Meetings: Never    Marital Status: Widowed    Tobacco Counseling Counseling given: Not Answered    Clinical Intake:  Pre-visit preparation completed: Yes  Pain : No/denies pain     BMI - recorded: 24.3 Nutritional Status: BMI of 19-24  Normal Nutritional Risks: None Diabetes: Yes CBG done?: No Did pt. bring in CBG monitor from home?: No  Lab Results  Component Value Date   HGBA1C 6.4 (A) 11/07/2023   HGBA1C 6.8 (A) 08/08/2023   HGBA1C 6.6 (A) 05/08/2023     How often do you need to have someone help you when you read instructions, pamphlets, or other written materials from your doctor or pharmacy?: 1 - Never What is the last grade level you completed in school?: some college  Interpreter Needed?: No  Information entered by :: Iyania Denne,CMA   Activities of Daily Living     02/02/2024    7:14 PM  In your present state of health, do you have any difficulty performing the following activities:  Hearing? 0  Vision? 0  Difficulty concentrating or making decisions? 0  Walking or climbing stairs? 0  Dressing or bathing? 0  Doing errands, shopping? 0  Preparing Food and eating ? N  Using the Toilet? N  In the past six months, have you accidently leaked urine? Y  Do you have problems with loss of bowel control? N  Managing your Medications? N  Managing your Finances? N  Housekeeping or managing your Housekeeping? N    Patient Care Team: Cleatus Arlyss RAMAN, MD as PCP - General (Family Medicine) Alvaro Ricardo KATHEE Raddle., MD as Consulting Physician (Urology) Jaye Fallow, MD as Referring Physician (Ophthalmology) Dennise Capri, MD (Nephrology)  I have updated your Care Teams any recent Medical Services you may have received  from other providers in the past year.     Assessment:   This is a routine wellness examination for Guy [name].  Hearing/Vision screen Hearing Screening - Comments:: No difficulties  Vision Screening - Comments:: Patient wears glasses    Goals Addressed             This Visit's Progress    Patient Stated       To go on a trip       Depression Screen     02/06/2024    1:23 PM 11/07/2023   10:24 AM 08/08/2023   11:40 AM 05/08/2023    9:04 AM 02/03/2023    8:50 AM 02/02/2023    3:51 PM 11/03/2022    9:49 AM  PHQ 2/9 Scores  PHQ - 2 Score 0 0 0 1 1 0 0  PHQ- 9 Score 0 1 1 2 3  1     Fall Risk     02/02/2024    7:14 PM 11/07/2023   10:24 AM 08/08/2023   11:40 AM 05/08/2023    9:04 AM 02/03/2023    8:49 AM  Fall Risk   Falls in the past year? 0 0 0 0 0  Number falls in past yr: 0 0 0 0 0  Injury with Fall? 0 0 0 0 0  Risk for fall due to : No Fall Risks No Fall Risks No Fall Risks No Fall Risks Impaired vision  Follow up Falls evaluation completed Falls evaluation completed  Falls evaluation completed Falls evaluation completed    MEDICARE RISK AT HOME:  Medicare Risk at Home Any stairs in or around the home?: (Patient-Rptd) Yes If so, are there any without handrails?: (  Patient-Rptd) No Home free of loose throw rugs in walkways, pet beds, electrical cords, etc?: (Patient-Rptd) Yes Adequate lighting in your home to reduce risk of falls?: (Patient-Rptd) Yes Life alert?: (Patient-Rptd) No Use of a cane, walker or w/c?: (Patient-Rptd) No Grab bars in the bathroom?: (Patient-Rptd) No Shower chair or bench in shower?: (Patient-Rptd) No Elevated toilet seat or a handicapped toilet?: (Patient-Rptd) No  TIMED UP AND GO:  Was the test performed?  No  Cognitive Function: 6CIT completed    02/07/2020    9:57 AM 01/04/2019   10:22 AM 01/01/2018    8:16 AM 01/07/2016    1:58 PM  MMSE - Mini Mental State Exam  Orientation to time 5 4 5 5    Orientation to Place 5 5 5 5     Registration 3 3 3 3    Attention/ Calculation 5 5 0 0   Recall 3 3 2 3    Recall-comments   unable to recall 1 of 3 words   Language- name 2 objects  0 0 0   Language- repeat 1 1 1 1   Language- follow 3 step command  0 3 3   Language- read & follow direction  0 0 0   Write a sentence  0 0 0   Copy design  0 0 0   Total score  21 19 20       Data saved with a previous flowsheet row definition        02/06/2024    1:20 PM 02/02/2023    3:53 PM  6CIT Screen  What Year? 0 points 0 points  What month? 0 points 0 points  What time? 0 points 0 points  Count back from 20 0 points 0 points  Months in reverse 0 points 0 points  Repeat phrase 0 points 0 points  Total Score 0 points 0 points    Immunizations Immunization History  Administered Date(s) Administered   Fluad Quad(high Dose 65+) 03/07/2019, 06/02/2022   Influenza Split 06/24/2011   Influenza Whole 05/09/2007, 04/27/2009   Influenza, High Dose Seasonal PF 07/10/2017, 04/06/2021, 03/09/2023   Influenza, Seasonal, Injecte, Preservative Fre 06/25/2012   Influenza,inj,Quad PF,6+ Mos 04/11/2018   Influenza-Unspecified 06/24/2014, 03/21/2015, 06/16/2016, 02/05/2020   PFIZER Comirnaty(Gray Top)Covid-19 Tri-Sucrose Vaccine 09/24/2020   PFIZER(Purple Top)SARS-COV-2 Vaccination 07/28/2019, 08/22/2019, 04/18/2020   Pfizer Covid-19 Vaccine Bivalent Booster 87yrs & up 04/06/2021   Pfizer(Comirnaty)Fall Seasonal Vaccine 12 years and older 07/13/2022, 03/09/2023   Pneumococcal Conjugate-13 09/09/2014   Pneumococcal Polysaccharide-23 05/20/1997, 06/24/2011   Respiratory Syncytial Virus Vaccine,Recomb Aduvanted(Arexvy) 03/23/2023   Td 03/12/2001, 06/24/2011   Unspecified SARS-COV-2 Vaccination 03/09/2023, 04/08/2023   Zoster Recombinant(Shingrix) 03/23/2023, 05/29/2023   Zoster, Live 07/24/2006    Screening Tests Health Maintenance  Topic Date Due   DTaP/Tdap/Td (3 - Tdap) 06/23/2021   Diabetic kidney evaluation - Urine ACR   03/03/2023   COVID-19 Vaccine (8 - Pfizer risk 2024-25 season) 10/07/2023   OPHTHALMOLOGY EXAM  12/02/2023   Diabetic kidney evaluation - eGFR measurement  01/31/2024   INFLUENZA VACCINE  01/19/2024   HEMOGLOBIN A1C  05/09/2024   FOOT EXAM  11/06/2024   Medicare Annual Wellness (AWV)  02/05/2025   Pneumococcal Vaccine: 50+ Years  Completed   Hepatitis C Screening  Completed   Zoster Vaccines- Shingrix  Completed   HPV VACCINES  Aged Out   Meningococcal B Vaccine  Aged Out   Pneumococcal Vaccine  Discontinued   Colonoscopy  Discontinued    Health Maintenance  Health Maintenance Due  Topic  Date Due   DTaP/Tdap/Td (3 - Tdap) 06/23/2021   Diabetic kidney evaluation - Urine ACR  03/03/2023   COVID-19 Vaccine (8 - Pfizer risk 2024-25 season) 10/07/2023   OPHTHALMOLOGY EXAM  12/02/2023   Diabetic kidney evaluation - eGFR measurement  01/31/2024   INFLUENZA VACCINE  01/19/2024   Health Maintenance Items Addressed:patient declined   Additional Screening:  Vision Screening: Recommended annual ophthalmology exams for early detection of glaucoma and other disorders of the eye. Would you like a referral to an eye doctor? No    Dental Screening: Recommended annual dental exams for proper oral hygiene  Community Resource Referral / Chronic Care Management: CRR required this visit?  No   CCM required this visit?  No   Plan:    I have personally reviewed and noted the following in the patient's chart:   Medical and social history Use of alcohol, tobacco or illicit drugs  Current medications and supplements including opioid prescriptions. Patient is not currently taking opioid prescriptions. Functional ability and status Nutritional status Physical activity Advanced directives List of other physicians Hospitalizations, surgeries, and ER visits in previous 12 months Vitals Screenings to include cognitive, depression, and falls Referrals and appointments  In addition, I have  reviewed and discussed with patient certain preventive protocols, quality metrics, and best practice recommendations. A written personalized care plan for preventive services as well as general preventive health recommendations were provided to patient.   Lyle MARLA Right, [place]   02/06/2024   After Visit Summary: (MyChart) Due to this being a telephonic visit, the after visit summary with patients personalized plan was offered to patient via MyChart   Notes: Nothing significant to report at this time.

## 2024-02-08 ENCOUNTER — Other Ambulatory Visit (INDEPENDENT_AMBULATORY_CARE_PROVIDER_SITE_OTHER)

## 2024-02-08 DIAGNOSIS — E1122 Type 2 diabetes mellitus with diabetic chronic kidney disease: Secondary | ICD-10-CM

## 2024-02-08 DIAGNOSIS — Z125 Encounter for screening for malignant neoplasm of prostate: Secondary | ICD-10-CM

## 2024-02-08 LAB — LIPID PANEL
Cholesterol: 146 mg/dL (ref 0–200)
HDL: 59.5 mg/dL (ref >39.00)
LDL Cholesterol: 77 mg/dL (ref 0–99)
NonHDL: 86.73
Total CHOL/HDL Ratio: 2
Triglycerides: 50 mg/dL (ref 0.0–149.0)
VLDL: 10 mg/dL (ref 0.0–40.0)

## 2024-02-08 LAB — PSA, MEDICARE: PSA: 0 ng/mL — ABNORMAL LOW (ref 0.10–4.00)

## 2024-02-08 LAB — HEMOGLOBIN A1C: Hgb A1c MFr Bld: 7.2 % — ABNORMAL HIGH (ref 4.6–6.5)

## 2024-02-08 LAB — TSH: TSH: 1.27 u[IU]/mL (ref 0.35–5.50)

## 2024-02-11 ENCOUNTER — Ambulatory Visit: Payer: Self-pay | Admitting: Family Medicine

## 2024-02-15 ENCOUNTER — Ambulatory Visit: Admitting: Family Medicine

## 2024-02-15 ENCOUNTER — Encounter: Payer: Self-pay | Admitting: Family Medicine

## 2024-02-15 VITALS — BP 116/68 | HR 60 | Temp 97.3°F | Ht 65.0 in

## 2024-02-15 DIAGNOSIS — Z23 Encounter for immunization: Secondary | ICD-10-CM | POA: Diagnosis not present

## 2024-02-15 DIAGNOSIS — Z7189 Other specified counseling: Secondary | ICD-10-CM

## 2024-02-15 DIAGNOSIS — E119 Type 2 diabetes mellitus without complications: Secondary | ICD-10-CM

## 2024-02-15 DIAGNOSIS — E1122 Type 2 diabetes mellitus with diabetic chronic kidney disease: Secondary | ICD-10-CM | POA: Diagnosis not present

## 2024-02-15 DIAGNOSIS — S71112A Laceration without foreign body, left thigh, initial encounter: Secondary | ICD-10-CM

## 2024-02-15 DIAGNOSIS — E785 Hyperlipidemia, unspecified: Secondary | ICD-10-CM

## 2024-02-15 DIAGNOSIS — Z0000 Encounter for general adult medical examination without abnormal findings: Secondary | ICD-10-CM

## 2024-02-15 DIAGNOSIS — R399 Unspecified symptoms and signs involving the genitourinary system: Secondary | ICD-10-CM | POA: Diagnosis not present

## 2024-02-15 MED ORDER — LOKELMA 10 G PO PACK: 10.0000 g | PACK | ORAL | Status: AC

## 2024-02-15 NOTE — Patient Instructions (Addendum)
 Hold pravastatin  for about 7-10 days and see if you notice a change in your joint pain. Either way, let me know.   Take care.  Glad to see you.  Recheck before your appointment with Dr. Dennise.    Go to the lab on the way out.   If you have mychart we'll likely use that to update you.     Tetanus shot today.   I would get a flu shot each fall.

## 2024-02-15 NOTE — Progress Notes (Signed)
 Diabetes:  Using medications without difficulties: yes Hypoglycemic episodes: no Hyperglycemic episodes:no Feet problems: no Blood Sugars averaging: usually ~120s.   eye exam within last year: yes- Dr. Jaye.   Diet d/w pt.  A1c higher.   Cr/MALB per renal clinic.  Recheck fructosamine pending.  He isn't sleeping well and unclear if that sleep disruption affects his sugar.  He is getting up at night to urinate.  Then has trouble getting back to sleep.   CKD managed per renal clinic.  Discussed continuing Farxiga  and losartan  for now.  Previous labs discussed with patient.  TSH wnl.  Discussed.  Healing fire ant stings on the B ankles.  See exam.  Lipids controlled.  On pravastatin .  He had more joint pain recently, esp on the R 2nd and 5th finger.  D/w pt about holding pravastatin .   Flu shot due this fall Shingles 2008 PNA prev done. Tetanus 2025 Covid prev done.  RSV prev done.    Colonoscopy 2023 PSA zero.   Living will d/w pt.  Would have Graig Sar designated if patient were incapacitated.    Healing cut on the L thigh, cut with hedge clipper. Scabbed.  Not painful now.   Meds, vitals, and allergies reviewed.  ROS: Per HPI unless specifically indicated in ROS section   GEN: nad, alert and oriented HEENT: ncat NECK: supple w/o LA CV: rrr. PULM: ctab, no inc wob ABD: soft, +bs EXT: no edema SKIN: well perfused.  The sites where he reported fire ant stings show some postinflammatory hyperpigmentation that appears to be resolving on the bilateral lower legs Healing scrape/abrasion that is scabbed noted on the left thigh without signs of infection.  Diabetic foot exam: Normal inspection No skin breakdown Small B 5th MT calluses  Normal DP pulses Normal sensation to light touch and monofilament Nails normal

## 2024-02-19 ENCOUNTER — Ambulatory Visit: Payer: Self-pay | Admitting: Family Medicine

## 2024-02-19 DIAGNOSIS — R399 Unspecified symptoms and signs involving the genitourinary system: Secondary | ICD-10-CM | POA: Insufficient documentation

## 2024-02-19 LAB — FRUCTOSAMINE: Fructosamine: 368 umol/L — ABNORMAL HIGH (ref 205–285)

## 2024-02-19 MED ORDER — MIRABEGRON ER 25 MG PO TB24: 25.0000 mg | ORAL_TABLET | Freq: Every day | ORAL | 5 refills | Status: AC

## 2024-02-19 NOTE — Assessment & Plan Note (Signed)
Living will d/w pt. Would have [name] designated if patient were incapacitated.

## 2024-02-19 NOTE — Assessment & Plan Note (Signed)
 On pravastatin .  He had more joint pain recently, esp on the R 2nd and 5th finger.  D/w pt about holding pravastatin .  See after visit summary.  He can let me know if he has any change in symptoms with holding pravastatin .

## 2024-02-19 NOTE — Assessment & Plan Note (Signed)
 Flu shot due this fall Shingles 2008 PNA prev done. Tetanus 2025 Covid prev done.  RSV prev done.    Colonoscopy 2023 PSA zero.   Living will d/w pt.  Would have Guy [name] designated if patient were incapacitated.

## 2024-02-19 NOTE — Assessment & Plan Note (Signed)
 He isn't sleeping well and unclear if that sleep disruption affects his sugar.  He is getting up at night to urinate.  Then has trouble getting back to sleep.  Discussed that I want to consider options regarding medications.  PSA is 0.  See notes on labs.

## 2024-02-19 NOTE — Assessment & Plan Note (Signed)
 eye exam within last year: yes- Dr. Jaye.   Diet d/w pt.  A1c higher.   Cr/MALB per renal clinic.   He isn't sleeping well and unclear if that sleep disruption affects his sugar.  He is getting up at night to urinate.  Then has trouble getting back to sleep.  See LUTS discussion.  Recheck fructosamine pending.  See notes on labs.

## 2024-02-20 ENCOUNTER — Encounter: Payer: Self-pay | Admitting: Family Medicine

## 2024-02-23 ENCOUNTER — Encounter: Payer: Self-pay | Admitting: Family Medicine

## 2024-02-28 ENCOUNTER — Encounter: Payer: Self-pay | Admitting: Family Medicine

## 2024-03-06 ENCOUNTER — Ambulatory Visit: Payer: Medicare Other | Admitting: Dermatology

## 2024-03-07 DIAGNOSIS — H43813 Vitreous degeneration, bilateral: Secondary | ICD-10-CM | POA: Diagnosis not present

## 2024-03-07 DIAGNOSIS — E119 Type 2 diabetes mellitus without complications: Secondary | ICD-10-CM | POA: Diagnosis not present

## 2024-03-07 DIAGNOSIS — H353211 Exudative age-related macular degeneration, right eye, with active choroidal neovascularization: Secondary | ICD-10-CM | POA: Diagnosis not present

## 2024-03-07 DIAGNOSIS — Z961 Presence of intraocular lens: Secondary | ICD-10-CM | POA: Diagnosis not present

## 2024-03-20 ENCOUNTER — Other Ambulatory Visit

## 2024-03-25 ENCOUNTER — Ambulatory Visit: Admitting: Dermatology

## 2024-03-25 ENCOUNTER — Encounter: Payer: Self-pay | Admitting: Dermatology

## 2024-03-25 DIAGNOSIS — Z1283 Encounter for screening for malignant neoplasm of skin: Secondary | ICD-10-CM

## 2024-03-25 DIAGNOSIS — L820 Inflamed seborrheic keratosis: Secondary | ICD-10-CM

## 2024-03-25 DIAGNOSIS — Z85828 Personal history of other malignant neoplasm of skin: Secondary | ICD-10-CM

## 2024-03-25 DIAGNOSIS — W908XXA Exposure to other nonionizing radiation, initial encounter: Secondary | ICD-10-CM | POA: Diagnosis not present

## 2024-03-25 DIAGNOSIS — L821 Other seborrheic keratosis: Secondary | ICD-10-CM | POA: Diagnosis not present

## 2024-03-25 DIAGNOSIS — D229 Melanocytic nevi, unspecified: Secondary | ICD-10-CM

## 2024-03-25 DIAGNOSIS — L814 Other melanin hyperpigmentation: Secondary | ICD-10-CM | POA: Diagnosis not present

## 2024-03-25 DIAGNOSIS — D1801 Hemangioma of skin and subcutaneous tissue: Secondary | ICD-10-CM

## 2024-03-25 DIAGNOSIS — L578 Other skin changes due to chronic exposure to nonionizing radiation: Secondary | ICD-10-CM | POA: Diagnosis not present

## 2024-03-25 NOTE — Patient Instructions (Addendum)
 Cryotherapy Aftercare  Wash gently with soap and water everyday.   Apply Vaseline and Band-Aid daily until healed.   Actinic keratoses are precancerous spots that appear secondary to cumulative UV radiation exposure/sun exposure over time. They are chronic with expected duration over 1 year. A portion of actinic keratoses will progress to squamous cell carcinoma of the skin. It is not possible to reliably predict which spots will progress to skin cancer and so treatment is recommended to prevent development of skin cancer.  Recommend daily broad spectrum sunscreen SPF 30+ to sun-exposed areas, reapply every 2 hours as needed.  Recommend staying in the shade or wearing long sleeves, sun glasses (UVA+UVB protection) and wide brim hats (4-inch brim around the entire circumference of the hat). Call for new or changing lesions.    Melanoma ABCDEs  Melanoma is the most dangerous type of skin cancer, and is the leading cause of death from skin disease.  You are more likely to develop melanoma if you: Have light-colored skin, light-colored eyes, or red or blond hair Spend a lot of time in the sun Tan regularly, either outdoors or in a tanning bed Have had blistering sunburns, especially during childhood Have a close family member who has had a melanoma Have atypical moles or large birthmarks  Early detection of melanoma is key since treatment is typically straightforward and cure rates are extremely high if we catch it early.   The first sign of melanoma is often a change in a mole or a new dark spot.  The ABCDE system is a way of remembering the signs of melanoma.  A for asymmetry:  The two halves do not match. B for border:  The edges of the growth are irregular. C for color:  A mixture of colors are present instead of an even brown color. D for diameter:  Melanomas are usually (but not always) greater than 6mm - the size of a pencil eraser. E for evolution:  The spot keeps changing in size,  shape, and color.  Please check your skin once per month between visits. You can use a small mirror in front and a large mirror behind you to keep an eye on the back side or your body.   If you see any new or changing lesions before your next follow-up, please call to schedule a visit.  Please continue daily skin protection including broad spectrum sunscreen SPF 30+ to sun-exposed areas, reapplying every 2 hours as needed when you're outdoors.   Staying in the shade or wearing long sleeves, sun glasses (UVA+UVB protection) and wide brim hats (4-inch brim around the entire circumference of the hat) are also recommended for sun protection.     Due to recent changes in healthcare laws, you may see results of your pathology and/or laboratory studies on MyChart before the doctors have had a chance to review them. We understand that in some cases there may be results that are confusing or concerning to you. Please understand that not all results are received at the same time and often the doctors may need to interpret multiple results in order to provide you with the best plan of care or course of treatment. Therefore, we ask that you please give us  2 business days to thoroughly review all your results before contacting the office for clarification. Should we see a critical lab result, you will be contacted sooner.   If You Need Anything After Your Visit  If you have any questions or concerns for your  doctor, please call our main line at [phone] and press option 4 to reach your doctor's medical assistant. If no one answers, please leave a voicemail as directed and we will return your call as soon as possible. Messages left after 4 pm will be answered the following business day.   You may also send us  a message via MyChart. We typically respond to MyChart messages within 1-2 business days.  For prescription refills, please ask your pharmacy to contact our office. Our fax number is  [phone].  If you have an urgent issue when the clinic is closed that cannot wait until the next business day, you can page your doctor at the number below.    Please note that while we do our best to be available for urgent issues outside of office hours, we are not available 24/7.   If you have an urgent issue and are unable to reach us , you may choose to seek medical care at your doctor's office, retail clinic, urgent care center, or emergency room.  If you have a medical emergency, please immediately call 911 or go to the emergency department.  Pager Numbers  - Dr. Hester: [phone]  - Dr. Jackquline: [phone]  - Dr. Claudene: [phone]   - Dr. Raymund: [phone]  In the event of inclement weather, please call our main line at [phone] for an update on the status of any delays or closures.  Dermatology Medication Tips: Please keep the boxes that topical medications come in in order to help keep track of the instructions about where and how to use these. Pharmacies typically print the medication instructions only on the boxes and not directly on the medication tubes.   If your medication is too expensive, please contact our office at [phone] option 4 or send us  a message through MyChart.   We are unable to tell what your co-pay for medications will be in advance as this is different depending on your insurance coverage. However, we may be able to find a substitute medication at lower cost or fill out paperwork to get insurance to cover a needed medication.   If a prior authorization is required to get your medication covered by your insurance company, please allow us  1-2 business days to complete this process.  Drug prices often vary depending on where the prescription is filled and some pharmacies may offer cheaper prices.  The website www.goodrx.com contains coupons for medications through different pharmacies. The prices here do not account for what the cost  may be with help from insurance (it may be cheaper with your insurance), but the website can give you the price if you did not use any insurance.  - You can print the associated coupon and take it with your prescription to the pharmacy.  - You may also stop by our office during regular business hours and pick up a GoodRx coupon card.  - If you need your prescription sent electronically to a different pharmacy, notify our office through [facility] or by phone at [phone] option 4.     Si Usted Necesita Algo Despus de Su Visita  Tambin puede enviarnos un mensaje a travs de Clinical cytogeneticist. Por lo general respondemos a los mensajes de MyChart en el transcurso de 1 a 2 das hbiles.  Para renovar recetas, por favor pida a su farmacia que se ponga en contacto con nuestra oficina. Randi lakes de fax es [place] [phone].  Si tiene un asunto urgente cuando la clnica est cerrada y que  no puede esperar hasta el siguiente da hbil, puede llamar/localizar a su doctor(a) al nmero que aparece a continuacin.   Por favor, tenga en cuenta que aunque hacemos todo lo posible para estar disponibles para asuntos urgentes fuera del horario de [place], no estamos disponibles las 24 horas del da, los 7 [address]  [address] de la [place].   Si tiene un problema urgente y no puede comunicarse con nosotros, puede optar por buscar atencin mdica  en el consultorio de su doctor(a), en una clnica privada, en un centro de atencin urgente o en una sala de emergencias.  Si tiene Engineer, drilling, por favor llame inmediatamente al 911 o vaya a la sala de emergencias.  Nmeros de bper  - Dr. Hester: [phone]  - Dra. Jackquline: [phone]  - Dr. Claudene: [phone]  - Dra. Kitts: [phone]  En caso de inclemencias del [place], por favor llame a nuestra lnea principal al [phone] para una actualizacin sobre el estado de cualquier retraso o cierre.  Consejos para la medicacin en dermatologa: Por  favor, guarde las cajas en las que vienen los medicamentos de uso tpico para ayudarle a seguir las instrucciones sobre dnde y cmo usarlos. Las farmacias generalmente imprimen las instrucciones del medicamento slo en las cajas y no directamente en los tubos del [place].   Si su medicamento es muy caro, por favor, pngase en contacto con landry rieger llamando al [phone] y presione la opcin 4 o envenos un mensaje a travs de Clinical cytogeneticist.   No podemos decirle cul ser su copago por los medicamentos por adelantado ya que esto es diferente dependiendo de la cobertura de su seguro. Sin embargo, es posible que podamos encontrar un medicamento sustituto a Audiological scientist un formulario para que el seguro cubra el medicamento que se considera necesario.   Si se requiere una autorizacin previa para que su compaa de seguros malta su medicamento, por favor permtanos de 1 a 2 das hbiles para completar este proceso.  Los precios de los medicamentos varan con frecuencia dependiendo del Environmental consultant de dnde se surte la receta y alguna farmacias pueden ofrecer precios ms baratos.  El sitio web www.goodrx.com tiene cupones para medicamentos de Health and safety inspector. Los precios aqu no tienen en cuenta lo que podra costar con la ayuda del seguro (puede ser ms barato con su seguro), pero el sitio web puede darle el precio si no utiliz Tourist information centre manager.  - Puede imprimir el cupn correspondiente y llevarlo con su receta a la farmacia.  - Tambin puede pasar por nuestra oficina durante el horario de atencin regular y Education officer, museum una tarjeta de cupones de GoodRx.  - Si necesita que su receta se enve electrnicamente a una farmacia diferente, informe a nuestra oficina a travs de MyChart de [place] o por telfono llamando al [phone] y presione la opcin 4.

## 2024-03-25 NOTE — Progress Notes (Signed)
   Follow-Up Visit   Subjective  Guy [name] is a 78 y.o. male who presents for the following: Skin Cancer Screening and Full Body Skin Exam Hx of bcc, hx of aks,   The patient presents for Total-Body Skin Exam (TBSE) for skin cancer screening and mole check. The patient has spots, moles and lesions to be evaluated, some may be new or changing and the patient may have concern these could be cancer.  The following portions of the chart were reviewed this encounter and updated as appropriate: medications, allergies, medical history  Review of Systems:  No other skin or systemic complaints except as noted in HPI or Assessment and Plan.  Objective  Well appearing patient in no apparent distress; mood and affect are within normal limits.  A full examination was performed including scalp, head, eyes, ears, nose, lips, neck, chest, axillae, abdomen, back, buttocks, bilateral upper extremities, bilateral lower extremities, hands, feet, fingers, toes, fingernails, and toenails. All findings within normal limits unless otherwise noted below.   Relevant physical exam findings are noted in the Assessment and Plan.  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  - Call if any new or changing lesions are noted between office visits   SKIN CANCER SCREENING PERFORMED TODAY.  ACTINIC DAMAGE - Chronic condition, secondary to cumulative UV/sun exposure - diffuse scaly erythematous macules with underlying dyspigmentation - Recommend daily broad spectrum sunscreen SPF 30+ to sun-exposed areas, reapply every 2 hours as needed.  -  Staying in the shade or wearing long sleeves, sun glasses (UVA+UVB protection) and wide brim hats (4-inch brim around the entire circumference of the hat) are also recommended for sun protection.  - Call for new or changing lesions.  LENTIGINES, SEBORRHEIC KERATOSES, HEMANGIOMAS - Benign normal skin lesions - Benign-appearing - Call for any changes  MELANOCYTIC NEVI - Tan-brown and/or pink-flesh-colored symmetric macules and papules - Benign appearing on exam today - Observation - Call clinic for new or changing moles - Recommend daily use of broad spectrum spf 30+ sunscreen to sun-exposed areas.   INFLAMED SEBORRHEIC KERATOSIS (2) right lateral bicep x 1, right dorsum hand x 1 (2) Symptomatic, irritating, patient would like treated. Destruction of lesion - right lateral bicep x 1, right dorsum hand x 1 (2) Complexity: simple   Destruction method: cryotherapy   Informed consent: discussed and consent obtained   Timeout:  patient name, date of birth, surgical site, and procedure verified Lesion destroyed using liquid nitrogen: Yes   Region frozen until ice ball extended beyond lesion: Yes   Outcome: patient tolerated procedure well with no complications   Post-procedure details: wound care instructions given    Return in about 1 year (around 03/25/2025) for TBSE.  IEleanor Blush, CMA, am acting as scribe for Alm Rhyme, MD.   Documentation: I have reviewed the above documentation for accuracy and completeness, and I agree with the above.  Alm Rhyme, MD

## 2024-03-31 ENCOUNTER — Other Ambulatory Visit: Payer: Self-pay | Admitting: Family Medicine

## 2024-03-31 DIAGNOSIS — E1122 Type 2 diabetes mellitus with diabetic chronic kidney disease: Secondary | ICD-10-CM

## 2024-04-04 DIAGNOSIS — C61 Malignant neoplasm of prostate: Secondary | ICD-10-CM | POA: Diagnosis not present

## 2024-04-05 DIAGNOSIS — Z23 Encounter for immunization: Secondary | ICD-10-CM | POA: Diagnosis not present

## 2024-04-10 ENCOUNTER — Other Ambulatory Visit (INDEPENDENT_AMBULATORY_CARE_PROVIDER_SITE_OTHER)

## 2024-04-10 ENCOUNTER — Ambulatory Visit: Payer: Self-pay | Admitting: Family Medicine

## 2024-04-10 DIAGNOSIS — E1122 Type 2 diabetes mellitus with diabetic chronic kidney disease: Secondary | ICD-10-CM | POA: Diagnosis not present

## 2024-04-10 LAB — HEMOGLOBIN A1C: Hgb A1c MFr Bld: 6.8 % — ABNORMAL HIGH (ref 4.6–6.5)

## 2024-04-11 DIAGNOSIS — R3915 Urgency of urination: Secondary | ICD-10-CM | POA: Diagnosis not present

## 2024-04-11 DIAGNOSIS — N393 Stress incontinence (female) (male): Secondary | ICD-10-CM | POA: Diagnosis not present

## 2024-04-11 DIAGNOSIS — C61 Malignant neoplasm of prostate: Secondary | ICD-10-CM | POA: Diagnosis not present

## 2024-04-15 DIAGNOSIS — Z23 Encounter for immunization: Secondary | ICD-10-CM | POA: Diagnosis not present

## 2024-04-18 DIAGNOSIS — I129 Hypertensive chronic kidney disease with stage 1 through stage 4 chronic kidney disease, or unspecified chronic kidney disease: Secondary | ICD-10-CM | POA: Diagnosis not present

## 2024-04-18 DIAGNOSIS — E875 Hyperkalemia: Secondary | ICD-10-CM | POA: Diagnosis not present

## 2024-04-18 DIAGNOSIS — I10 Essential (primary) hypertension: Secondary | ICD-10-CM | POA: Diagnosis not present

## 2024-04-18 DIAGNOSIS — E1122 Type 2 diabetes mellitus with diabetic chronic kidney disease: Secondary | ICD-10-CM | POA: Diagnosis not present

## 2024-04-18 DIAGNOSIS — N2581 Secondary hyperparathyroidism of renal origin: Secondary | ICD-10-CM | POA: Diagnosis not present

## 2024-04-22 DIAGNOSIS — I10 Essential (primary) hypertension: Secondary | ICD-10-CM | POA: Diagnosis not present

## 2024-04-22 DIAGNOSIS — E875 Hyperkalemia: Secondary | ICD-10-CM | POA: Diagnosis not present

## 2024-04-22 DIAGNOSIS — I129 Hypertensive chronic kidney disease with stage 1 through stage 4 chronic kidney disease, or unspecified chronic kidney disease: Secondary | ICD-10-CM | POA: Diagnosis not present

## 2024-04-22 DIAGNOSIS — E1122 Type 2 diabetes mellitus with diabetic chronic kidney disease: Secondary | ICD-10-CM | POA: Diagnosis not present

## 2024-04-22 DIAGNOSIS — N189 Chronic kidney disease, unspecified: Secondary | ICD-10-CM | POA: Diagnosis not present

## 2024-05-30 DIAGNOSIS — H353211 Exudative age-related macular degeneration, right eye, with active choroidal neovascularization: Secondary | ICD-10-CM | POA: Diagnosis not present

## 2024-06-11 ENCOUNTER — Other Ambulatory Visit: Payer: Self-pay | Admitting: Family Medicine

## 2024-07-14 ENCOUNTER — Other Ambulatory Visit: Payer: Self-pay | Admitting: Family Medicine

## 2024-07-14 DIAGNOSIS — E1122 Type 2 diabetes mellitus with diabetic chronic kidney disease: Secondary | ICD-10-CM

## 2024-07-15 ENCOUNTER — Other Ambulatory Visit: Payer: Self-pay | Admitting: Family Medicine

## 2024-07-15 DIAGNOSIS — E1122 Type 2 diabetes mellitus with diabetic chronic kidney disease: Secondary | ICD-10-CM

## 2024-07-15 MED ORDER — ACCU-CHEK SOFTCLIX LANCETS MISC: 0 refills | Status: AC

## 2024-07-15 MED ORDER — ACCU-CHEK GUIDE ME W/DEVICE KIT: PACK | 0 refills | Status: AC

## 2024-07-15 NOTE — Telephone Encounter (Signed)
 Received message from pharmacy stating OneTouch Verio test strips no longer covered. Either PA needed or change rx to Accu-Chek, which is covered.   E-scribed rxs for Accu-Chek Guide Me meter kit, Accu-Chek Guide test strips and Accu-Chek Softclix lancets to [facility].

## 2024-07-22 ENCOUNTER — Other Ambulatory Visit: Payer: Self-pay | Admitting: Family Medicine

## 2025-02-06 ENCOUNTER — Ambulatory Visit

## 2025-02-07 ENCOUNTER — Ambulatory Visit

## 2025-03-25 ENCOUNTER — Ambulatory Visit: Admitting: Dermatology
